# Patient Record
Sex: Female | Born: 1955 | Race: White | Hispanic: No | Marital: Single | Smoking: Never smoker
Health system: Southern US, Community
[De-identification: ages and names within clinical notes are randomized; demographics above are authoritative.]

## PROBLEM LIST (undated history)

## (undated) DIAGNOSIS — M6282 Rhabdomyolysis: Secondary | ICD-10-CM

## (undated) DIAGNOSIS — G210 Malignant neuroleptic syndrome: Secondary | ICD-10-CM

## (undated) DIAGNOSIS — R0902 Hypoxemia: Secondary | ICD-10-CM

## (undated) DIAGNOSIS — K219 Gastro-esophageal reflux disease without esophagitis: Secondary | ICD-10-CM

## (undated) DIAGNOSIS — M839 Adult osteomalacia, unspecified: Secondary | ICD-10-CM

## (undated) DIAGNOSIS — F79 Unspecified intellectual disabilities: Secondary | ICD-10-CM

## (undated) DIAGNOSIS — E079 Disorder of thyroid, unspecified: Secondary | ICD-10-CM

## (undated) DIAGNOSIS — I10 Essential (primary) hypertension: Secondary | ICD-10-CM

## (undated) DIAGNOSIS — F329 Major depressive disorder, single episode, unspecified: Secondary | ICD-10-CM

## (undated) DIAGNOSIS — F32A Depression, unspecified: Secondary | ICD-10-CM

## (undated) DIAGNOSIS — F209 Schizophrenia, unspecified: Secondary | ICD-10-CM

## (undated) HISTORY — DX: Malignant neuroleptic syndrome: G21.0

## (undated) HISTORY — DX: Rhabdomyolysis: M62.82

## (undated) HISTORY — DX: Gastro-esophageal reflux disease without esophagitis: K21.9

## (undated) HISTORY — DX: Adult osteomalacia, unspecified: M83.9

## (undated) HISTORY — DX: Schizophrenia, unspecified: F20.9

## (undated) HISTORY — DX: Essential (primary) hypertension: I10

## (undated) HISTORY — DX: Depression, unspecified: F32.A

## (undated) HISTORY — DX: Disorder of thyroid, unspecified: E07.9

## (undated) HISTORY — DX: Major depressive disorder, single episode, unspecified: F32.9

## (undated) HISTORY — DX: Unspecified intellectual disabilities: F79

## (undated) HISTORY — DX: Hypoxemia: R09.02

---

## 1999-08-27 ENCOUNTER — Other Ambulatory Visit: Admission: RE | Admit: 1999-08-27 | Discharge: 1999-08-27 | Payer: Self-pay | Admitting: *Deleted

## 2002-04-19 ENCOUNTER — Emergency Department (HOSPITAL_COMMUNITY): Admission: EM | Admit: 2002-04-19 | Discharge: 2002-04-19 | Payer: Self-pay

## 2002-05-23 ENCOUNTER — Other Ambulatory Visit: Admission: RE | Admit: 2002-05-23 | Discharge: 2002-05-23 | Payer: Self-pay | Admitting: Obstetrics and Gynecology

## 2002-06-28 ENCOUNTER — Encounter: Admission: RE | Admit: 2002-06-28 | Discharge: 2002-06-28 | Payer: Self-pay | Admitting: Obstetrics and Gynecology

## 2002-06-28 ENCOUNTER — Encounter: Payer: Self-pay | Admitting: Obstetrics and Gynecology

## 2003-08-21 ENCOUNTER — Encounter: Admission: RE | Admit: 2003-08-21 | Discharge: 2003-08-21 | Payer: Self-pay | Admitting: Obstetrics and Gynecology

## 2004-10-07 ENCOUNTER — Encounter: Admission: RE | Admit: 2004-10-07 | Discharge: 2004-10-07 | Payer: Self-pay | Admitting: Obstetrics and Gynecology

## 2005-02-25 ENCOUNTER — Encounter: Admission: RE | Admit: 2005-02-25 | Discharge: 2005-05-17 | Payer: Self-pay | Admitting: Internal Medicine

## 2005-09-10 ENCOUNTER — Other Ambulatory Visit: Admission: RE | Admit: 2005-09-10 | Discharge: 2005-09-10 | Payer: Self-pay | Admitting: Family Medicine

## 2005-10-27 ENCOUNTER — Encounter: Admission: RE | Admit: 2005-10-27 | Discharge: 2005-10-27 | Payer: Self-pay | Admitting: Family Medicine

## 2005-11-25 ENCOUNTER — Ambulatory Visit: Payer: Self-pay | Admitting: Family Medicine

## 2005-12-10 ENCOUNTER — Ambulatory Visit: Payer: Self-pay | Admitting: Family Medicine

## 2006-02-10 ENCOUNTER — Ambulatory Visit: Payer: Self-pay | Admitting: Family Medicine

## 2006-05-27 ENCOUNTER — Ambulatory Visit: Payer: Self-pay | Admitting: Family Medicine

## 2006-11-04 ENCOUNTER — Ambulatory Visit: Payer: Self-pay | Admitting: Family Medicine

## 2006-11-04 ENCOUNTER — Encounter: Admission: RE | Admit: 2006-11-04 | Discharge: 2006-11-04 | Payer: Self-pay | Admitting: Family Medicine

## 2006-12-01 ENCOUNTER — Encounter: Admission: RE | Admit: 2006-12-01 | Discharge: 2006-12-01 | Payer: Self-pay | Admitting: Family Medicine

## 2007-01-12 ENCOUNTER — Ambulatory Visit: Payer: Self-pay | Admitting: Family Medicine

## 2007-06-14 ENCOUNTER — Ambulatory Visit: Payer: Self-pay | Admitting: Family Medicine

## 2007-11-01 ENCOUNTER — Ambulatory Visit: Payer: Self-pay | Admitting: Family Medicine

## 2007-11-06 ENCOUNTER — Emergency Department (HOSPITAL_COMMUNITY): Admission: EM | Admit: 2007-11-06 | Discharge: 2007-11-06 | Payer: Self-pay | Admitting: Emergency Medicine

## 2007-11-23 ENCOUNTER — Ambulatory Visit: Payer: Self-pay | Admitting: Family Medicine

## 2008-01-11 ENCOUNTER — Ambulatory Visit: Payer: Self-pay | Admitting: Family Medicine

## 2008-02-21 ENCOUNTER — Ambulatory Visit: Payer: Self-pay | Admitting: Family Medicine

## 2008-04-25 ENCOUNTER — Ambulatory Visit: Payer: Self-pay | Admitting: Family Medicine

## 2008-05-09 ENCOUNTER — Ambulatory Visit: Payer: Self-pay | Admitting: Family Medicine

## 2008-05-17 ENCOUNTER — Ambulatory Visit: Payer: Self-pay | Admitting: Family Medicine

## 2008-05-18 HISTORY — PX: COLONOSCOPY: SHX174

## 2008-07-10 ENCOUNTER — Ambulatory Visit: Payer: Self-pay | Admitting: Family Medicine

## 2008-07-10 ENCOUNTER — Other Ambulatory Visit: Admission: RE | Admit: 2008-07-10 | Discharge: 2008-07-10 | Payer: Self-pay | Admitting: Family Medicine

## 2008-07-10 LAB — HM PAP SMEAR: HM Pap smear: NORMAL

## 2008-07-19 ENCOUNTER — Encounter: Admission: RE | Admit: 2008-07-19 | Discharge: 2008-07-19 | Payer: Self-pay | Admitting: Family Medicine

## 2008-07-25 ENCOUNTER — Encounter: Admission: RE | Admit: 2008-07-25 | Discharge: 2008-07-25 | Payer: Self-pay | Admitting: Family Medicine

## 2008-09-11 ENCOUNTER — Ambulatory Visit: Payer: Self-pay | Admitting: Family Medicine

## 2009-03-12 ENCOUNTER — Ambulatory Visit: Payer: Self-pay | Admitting: Family Medicine

## 2009-06-29 ENCOUNTER — Emergency Department (HOSPITAL_BASED_OUTPATIENT_CLINIC_OR_DEPARTMENT_OTHER): Admission: EM | Admit: 2009-06-29 | Discharge: 2009-06-29 | Payer: Self-pay | Admitting: Emergency Medicine

## 2009-10-09 ENCOUNTER — Encounter: Admission: RE | Admit: 2009-10-09 | Discharge: 2009-10-09 | Payer: Self-pay | Admitting: Family Medicine

## 2009-10-09 LAB — HM MAMMOGRAPHY: HM Mammogram: NEGATIVE

## 2009-11-15 ENCOUNTER — Ambulatory Visit: Payer: Self-pay | Admitting: Family Medicine

## 2009-11-26 ENCOUNTER — Ambulatory Visit: Payer: Self-pay | Admitting: Physician Assistant

## 2010-01-29 ENCOUNTER — Ambulatory Visit: Payer: Self-pay | Admitting: Family Medicine

## 2010-02-04 ENCOUNTER — Ambulatory Visit: Payer: Self-pay | Admitting: Physician Assistant

## 2010-02-14 ENCOUNTER — Ambulatory Visit: Payer: Self-pay | Admitting: Family Medicine

## 2010-05-21 ENCOUNTER — Ambulatory Visit
Admission: RE | Admit: 2010-05-21 | Discharge: 2010-05-21 | Payer: Self-pay | Source: Home / Self Care | Attending: Family Medicine | Admitting: Family Medicine

## 2010-06-04 ENCOUNTER — Emergency Department (HOSPITAL_COMMUNITY)
Admission: EM | Admit: 2010-06-04 | Discharge: 2010-06-04 | Payer: Self-pay | Source: Home / Self Care | Admitting: Emergency Medicine

## 2010-06-07 ENCOUNTER — Encounter: Payer: Self-pay | Admitting: Neurology

## 2010-06-11 ENCOUNTER — Ambulatory Visit
Admission: RE | Admit: 2010-06-11 | Discharge: 2010-06-11 | Payer: Self-pay | Source: Home / Self Care | Attending: Family Medicine | Admitting: Family Medicine

## 2010-08-07 LAB — CBC
MCV: 92.7 fL (ref 78.0–100.0)
Platelets: 208 10*3/uL (ref 150–400)
RBC: 4.24 MIL/uL (ref 3.87–5.11)
WBC: 5.4 10*3/uL (ref 4.0–10.5)

## 2010-08-07 LAB — URINALYSIS, ROUTINE W REFLEX MICROSCOPIC
Glucose, UA: NEGATIVE mg/dL
Protein, ur: NEGATIVE mg/dL
Specific Gravity, Urine: 1.026 (ref 1.005–1.030)
Urobilinogen, UA: 1 mg/dL (ref 0.0–1.0)

## 2010-08-07 LAB — DIFFERENTIAL
Basophils Relative: 0 % (ref 0–1)
Eosinophils Absolute: 0.1 10*3/uL (ref 0.0–0.7)
Lymphs Abs: 2.1 10*3/uL (ref 0.7–4.0)
Monocytes Relative: 7 % (ref 3–12)
Neutro Abs: 2.8 10*3/uL (ref 1.7–7.7)
Neutrophils Relative %: 52 % (ref 43–77)

## 2010-08-07 LAB — URINE CULTURE

## 2010-08-07 LAB — BASIC METABOLIC PANEL
BUN: 19 mg/dL (ref 6–23)
Calcium: 8.7 mg/dL (ref 8.4–10.5)
Chloride: 103 mEq/L (ref 96–112)
Creatinine, Ser: 0.7 mg/dL (ref 0.4–1.2)
GFR calc Af Amer: >60 mL/min (ref >60)

## 2010-08-07 LAB — URINE MICROSCOPIC-ADD ON

## 2010-08-07 LAB — ETHANOL: Alcohol, Ethyl (B): <10 mg/dL (ref 0–10)

## 2010-08-07 LAB — POCT TOXICOLOGY PANEL

## 2010-09-29 ENCOUNTER — Emergency Department (HOSPITAL_BASED_OUTPATIENT_CLINIC_OR_DEPARTMENT_OTHER)
Admission: EM | Admit: 2010-09-29 | Discharge: 2010-09-29 | Disposition: A | Discharge status: Home / Self Care | Payer: Medicare PPO | Attending: Emergency Medicine | Admitting: Emergency Medicine

## 2010-09-29 DIAGNOSIS — E119 Type 2 diabetes mellitus without complications: Secondary | ICD-10-CM | POA: Insufficient documentation

## 2010-09-29 DIAGNOSIS — K219 Gastro-esophageal reflux disease without esophagitis: Secondary | ICD-10-CM | POA: Insufficient documentation

## 2010-09-29 DIAGNOSIS — R040 Epistaxis: Secondary | ICD-10-CM | POA: Insufficient documentation

## 2010-09-29 DIAGNOSIS — Z79899 Other long term (current) drug therapy: Secondary | ICD-10-CM | POA: Insufficient documentation

## 2010-09-29 DIAGNOSIS — I10 Essential (primary) hypertension: Secondary | ICD-10-CM | POA: Insufficient documentation

## 2010-09-29 DIAGNOSIS — E785 Hyperlipidemia, unspecified: Secondary | ICD-10-CM | POA: Insufficient documentation

## 2010-10-14 ENCOUNTER — Telehealth: Payer: Self-pay | Admitting: Family Medicine

## 2010-10-14 NOTE — Telephone Encounter (Signed)
It's time for her to come in for a diabetes followup and appropriate blood work anyway.

## 2010-10-15 ENCOUNTER — Telehealth: Payer: Self-pay

## 2010-10-15 NOTE — Telephone Encounter (Signed)
Talked with brother he will make apt for diabetes check so they can get blood work

## 2010-10-29 ENCOUNTER — Encounter: Payer: Self-pay | Admitting: Family Medicine

## 2010-10-30 ENCOUNTER — Encounter: Payer: Medicare PPO | Admitting: Family Medicine

## 2010-10-31 ENCOUNTER — Other Ambulatory Visit: Payer: Self-pay

## 2010-10-31 MED ORDER — ATORVASTATIN CALCIUM 20 MG PO TABS: 20.0000 mg | ORAL_TABLET | Freq: Every day | ORAL | Status: DC

## 2010-11-03 ENCOUNTER — Other Ambulatory Visit: Payer: Self-pay | Admitting: Family Medicine

## 2010-11-03 ENCOUNTER — Other Ambulatory Visit: Payer: Medicare PPO

## 2010-11-03 DIAGNOSIS — Z1231 Encounter for screening mammogram for malignant neoplasm of breast: Secondary | ICD-10-CM

## 2010-11-03 LAB — LIPID PANEL
Cholesterol: 211 mg/dL — ABNORMAL HIGH (ref 0–200)
HDL: 47 mg/dL (ref >39)
Total CHOL/HDL Ratio: 4.5 Ratio
Triglycerides: 115 mg/dL (ref <150)
VLDL: 23 mg/dL (ref 0–40)

## 2010-11-04 NOTE — Progress Notes (Signed)
She is going to see Vincenza Hews 6/25 for med check

## 2010-11-10 ENCOUNTER — Encounter: Payer: Self-pay | Admitting: Medical

## 2010-11-10 ENCOUNTER — Encounter: Payer: Medicare PPO | Admitting: Medical

## 2010-11-10 ENCOUNTER — Ambulatory Visit (INDEPENDENT_AMBULATORY_CARE_PROVIDER_SITE_OTHER): Payer: Medicare PPO | Admitting: Medical

## 2010-11-10 VITALS — BP 132/88 | HR 78 | Temp 98.4°F | Ht 63.0 in | Wt 172.0 lb

## 2010-11-10 DIAGNOSIS — E039 Hypothyroidism, unspecified: Secondary | ICD-10-CM

## 2010-11-10 DIAGNOSIS — K219 Gastro-esophageal reflux disease without esophagitis: Secondary | ICD-10-CM

## 2010-11-10 DIAGNOSIS — E785 Hyperlipidemia, unspecified: Secondary | ICD-10-CM

## 2010-11-10 MED ORDER — ATORVASTATIN CALCIUM 40 MG PO TABS: 40.0000 mg | ORAL_TABLET | Freq: Every day | ORAL | Status: DC

## 2010-11-10 NOTE — Progress Notes (Signed)
Addended by: Jac Canavan on: 11/10/2010 03:12 PM   Modules accepted: Orders

## 2010-11-10 NOTE — Progress Notes (Signed)
Subjective:     Annette Martin is an 55 y.o. female who presents for follow up of diabetes and chronic issues.  She is here with her guardian/brother. Current symptoms include: none.  She has been doing well.  Last visit her Metformin was stopped since her numbers had improved.  Last dilated eye exam 1/12.  She goes to the [facility], and goes to an adult program 2 days /wk.  She is exercising some.  She sees [facility], Dr. Areatha Keas for mental health treatment.  She came in last week for fasting labs - lipid and HgbA1C.   The LDL was over 140 and the HgbA1C was 5.3%.  She is taking her cholesterol medication and other meds regularly, but brother notes that her diet hasn't been as good lately, more sweets than usual.  No other new c/o.    The following portions of the patient's history were reviewed and updated as appropriate: allergies, current medications, past family history, past medical history, past social history, past surgical history and problem list.  Past Medical History  Diagnosis Date  . Depression   . Thyroid disease   . GERD (gastroesophageal reflux disease)   . Diabetes mellitus   . Vitamin D deficient osteomalacia   . Mental retardation     Review of Systems Constitutional: denies recent fevers, chills, weight changes, anorexia Skin: denies foot lesions, rash, or other worrisome lesion ENT: denies cough, runny nose, sore throat, hoarseness Cardiology: denies chest pain, palpitations, edema, orthopnea, paroxysmal nocturnal dyspnea Respiratory: denies shortness of breath, dyspnea on exertion, wheezing Gastroenterology: no abdominal pain, nausea, vomiting, diarrhea, constipation, bowel change, blood in stool Musculoskeletal: denies arthralgias, myalgias, cramps Opthalmology: denies vision change, blurred vision, allergy symptoms Urology: denies dysuria, frequency, urgency Neurology: denies numbness, tingling, weakness, gait changes     Objective:    Filed Vitals:   11/10/10 1413  BP: 132/88  Pulse: 78  Temp: 98.4 F (36.9 C)    General Appearance:    Alert, no distress, WD, WN white female  Oral cavity:   Lips, mucosa, and tongue normal; teeth and gums normal  Neck:   Supple, no lymphadenopathy;  thyroid:  no    enlargement/tenderness/nodules; no carotid   bruit or JVD  Lungs:     Clear to auscultation bilaterally without wheezes, rales or       rhonchi; respirations unlabored   Heart:    Regular rate and rhythm, S1 and S2 normal, no murmur  Abdomen:     Soft, non-tender, non distended, normoactive bowel sounds,    no masses, no hepatosplenomegaly  Extremities:   No clubbing, cyanosis or edema  Pulses:   2+ and symmetric all extremities  Skin:   Warm, dry, normal turgor, specifically - no foot lesions  Neurologic:   monofilament exam of feet seems decreased in general          Psych:   Normal mood, affect, hygiene and grooming.       Assessment:   Encounter Diagnoses  Name Primary?  . Hyperlipidemia Yes  . Hypothyroid   . Type II or unspecified type diabetes mellitus without mention of complication, uncontrolled   . GERD (gastroesophageal reflux disease)      Plan:   LDL and lipids had increased significantly from last labs in September.  I increased her Lipitor today, advised she be careful with diet, discussed diet and exercise.  Otherwise, diabetes is diet controlled, c/t other meds as usual.  Given hx/o hypothyroidism, labs today.  She apparently has not been on thyroid medication for a while now.  She and her brother/guardian seemed unaware of the diagnosis.  Otherwise c/t daily foot checks, f/u with mammogram as planned soon, and recheck in 94mo.

## 2010-11-10 NOTE — Patient Instructions (Signed)
We increased your cholesterol medication, Lipitor, today to 40 mg.  Otherwise, continue your medications as usual.  We will call with additional lab results.

## 2010-11-12 ENCOUNTER — Telehealth: Payer: Self-pay | Admitting: *Deleted

## 2010-11-12 NOTE — Telephone Encounter (Addendum)
Message copied by Dorthula Perfect on Wed Nov 12, 2010 11:01 AM ------      Message from: Aleen Campi, DAVID S      Created: Wed Nov 12, 2010 10:58 AM       Thyroid and liver tests looks fine.  We increased her cholesterol medication dose this past visit.  Eat healthy, get some exercise regularly, and recheck in 91mo.    Pt's legal guardian was notified of lab results.  CM, LPN

## 2010-11-13 ENCOUNTER — Ambulatory Visit
Admission: RE | Admit: 2010-11-13 | Discharge: 2010-11-13 | Disposition: A | Discharge status: Home / Self Care | Payer: Medicare PPO | Source: Ambulatory Visit | Attending: Family Medicine | Admitting: Family Medicine

## 2010-11-13 DIAGNOSIS — Z1231 Encounter for screening mammogram for malignant neoplasm of breast: Secondary | ICD-10-CM

## 2011-02-12 LAB — URINE MICROSCOPIC-ADD ON

## 2011-02-12 LAB — COMPREHENSIVE METABOLIC PANEL
BUN: 8
CO2: 29
Calcium: 9
Creatinine, Ser: 0.62
GFR calc Af Amer: >60
GFR calc non Af Amer: >60
Glucose, Bld: 115 — ABNORMAL HIGH
Total Bilirubin: 1

## 2011-02-12 LAB — CBC
HCT: 39.7
Hemoglobin: 13.6
MCHC: 34.3
MCV: 88.2
RBC: 4.49

## 2011-02-12 LAB — URINALYSIS, ROUTINE W REFLEX MICROSCOPIC
Nitrite: NEGATIVE
Specific Gravity, Urine: 1.02
Urobilinogen, UA: 0.2
pH: 6

## 2011-02-12 LAB — DIFFERENTIAL
Basophils Absolute: 0
Lymphocytes Relative: 32
Lymphs Abs: 1.9
Neutro Abs: 3.5
Neutrophils Relative %: 58

## 2011-02-12 LAB — RAPID URINE DRUG SCREEN, HOSP PERFORMED
Amphetamines: NOT DETECTED
Cocaine: NOT DETECTED
Opiates: NOT DETECTED
Tetrahydrocannabinol: NOT DETECTED

## 2011-02-12 LAB — CARBAMAZEPINE LEVEL, TOTAL: Carbamazepine Lvl: 2.9 — ABNORMAL LOW

## 2011-02-12 LAB — PREGNANCY, URINE: Preg Test, Ur: NEGATIVE

## 2011-02-12 LAB — ETHANOL: Alcohol, Ethyl (B): <5

## 2011-05-05 ENCOUNTER — Emergency Department (HOSPITAL_COMMUNITY): Payer: Medicare PPO

## 2011-05-05 ENCOUNTER — Emergency Department (HOSPITAL_COMMUNITY)
Admission: EM | Admit: 2011-05-05 | Discharge: 2011-05-05 | Disposition: A | Discharge status: Home / Self Care | Payer: Medicare PPO | Attending: Emergency Medicine | Admitting: Emergency Medicine

## 2011-05-05 DIAGNOSIS — M25529 Pain in unspecified elbow: Secondary | ICD-10-CM | POA: Insufficient documentation

## 2011-05-05 DIAGNOSIS — S0083XA Contusion of other part of head, initial encounter: Secondary | ICD-10-CM | POA: Insufficient documentation

## 2011-05-05 DIAGNOSIS — S335XXA Sprain of ligaments of lumbar spine, initial encounter: Secondary | ICD-10-CM | POA: Insufficient documentation

## 2011-05-05 DIAGNOSIS — S5002XA Contusion of left elbow, initial encounter: Secondary | ICD-10-CM

## 2011-05-05 DIAGNOSIS — S5000XA Contusion of unspecified elbow, initial encounter: Secondary | ICD-10-CM | POA: Insufficient documentation

## 2011-05-05 DIAGNOSIS — S0003XA Contusion of scalp, initial encounter: Secondary | ICD-10-CM | POA: Insufficient documentation

## 2011-05-05 DIAGNOSIS — S46911A Strain of unspecified muscle, fascia and tendon at shoulder and upper arm level, right arm, initial encounter: Secondary | ICD-10-CM

## 2011-05-05 DIAGNOSIS — E119 Type 2 diabetes mellitus without complications: Secondary | ICD-10-CM | POA: Insufficient documentation

## 2011-05-05 DIAGNOSIS — IMO0002 Reserved for concepts with insufficient information to code with codable children: Secondary | ICD-10-CM | POA: Insufficient documentation

## 2011-05-05 DIAGNOSIS — M542 Cervicalgia: Secondary | ICD-10-CM | POA: Insufficient documentation

## 2011-05-05 DIAGNOSIS — S39012A Strain of muscle, fascia and tendon of lower back, initial encounter: Secondary | ICD-10-CM

## 2011-05-05 DIAGNOSIS — M25519 Pain in unspecified shoulder: Secondary | ICD-10-CM | POA: Insufficient documentation

## 2011-05-05 DIAGNOSIS — M545 Low back pain, unspecified: Secondary | ICD-10-CM | POA: Insufficient documentation

## 2011-05-05 DIAGNOSIS — W108XXA Fall (on) (from) other stairs and steps, initial encounter: Secondary | ICD-10-CM | POA: Insufficient documentation

## 2011-05-05 DIAGNOSIS — F79 Unspecified intellectual disabilities: Secondary | ICD-10-CM | POA: Insufficient documentation

## 2011-05-05 DIAGNOSIS — R51 Headache: Secondary | ICD-10-CM | POA: Insufficient documentation

## 2011-05-05 MED ORDER — IBUPROFEN 400 MG PO TABS: 400.0000 mg | ORAL_TABLET | Freq: Three times a day (TID) | ORAL | Status: AC | PRN

## 2011-05-05 MED ORDER — HYDROMORPHONE HCL PF 1 MG/ML IJ SOLN
1.0000 mg | Freq: Once | INTRAMUSCULAR | Status: AC
  Administered 2011-05-05: 1 mg via INTRAMUSCULAR
  Filled 2011-05-05: qty 1

## 2011-05-05 MED ORDER — HYDROCODONE-ACETAMINOPHEN 5-325 MG PO TABS: 2.0000 | ORAL_TABLET | ORAL | Status: AC | PRN

## 2011-05-05 NOTE — ED Notes (Signed)
Back from radiology.
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
## 2011-05-05 NOTE — ED Notes (Signed)
Pt arrived in c-collar and LSB.
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
## 2011-05-05 NOTE — ED Provider Notes (Signed)
History     CSN: [id number] Arrival date & time: 05/05/2011  9:41 AM   First MD Initiated Contact with Patient 05/05/11 1003      Chief Complaint  Patient presents with  . Fall    (Consider location/radiation/quality/duration/timing/severity/associated sxs/prior treatment) Patient is a 55 y.o. female presenting with fall. The history is provided by the patient and a caregiver.  Fall The accident occurred 1 to 2 hours ago. The fall occurred while walking. She fell from a height of 6 to 10 ft (The patient fell down 10 stairs backwards hitting the back of her head with brief loss of consciousness). She landed on a hard floor. There was no blood loss. The point of impact was the head, right shoulder and neck (Lumbar spine). The pain is present in the head, left elbow, right shoulder and neck (Lumbar spine). The pain is moderate. She was not ambulatory at the scene. There was no entrapment after the fall. There was no drug use involved in the accident. There was no alcohol use involved in the accident. Associated symptoms include headaches and loss of consciousness. Pertinent negatives include no visual change, no numbness, no abdominal pain, no bowel incontinence, no nausea, no vomiting, no hematuria, no hearing loss and no tingling. Exacerbated by: Movement and palpation of the areas indicated to be painful. Treatment on scene includes a c-collar and a backboard. She has tried nothing for the symptoms.    Past Medical History  Diagnosis Date  . Depression   . Thyroid disease   . GERD (gastroesophageal reflux disease)   . Diabetes mellitus   . Vitamin D deficient osteomalacia   . Mental retardation     Past Surgical History  Procedure Date  . Colonoscopy 2010    MANN    No family history on file.  History  Substance Use Topics  . Smoking status: Never Smoker   . Smokeless tobacco: Never Used  . Alcohol Use: No    OB History    Grav Para Term Preterm Abortions TAB SAB Ect  Mult Living                  Review of Systems  Constitutional: Negative for diaphoresis.  HENT: Positive for neck pain. Negative for hearing loss, ear pain, nosebleeds, congestion, facial swelling, rhinorrhea, dental problem, voice change, postnasal drip, tinnitus and ear discharge.   Eyes: Negative for photophobia, pain, redness and visual disturbance.  Respiratory: Negative for cough, choking, shortness of breath, wheezing and stridor.   Cardiovascular: Negative for chest pain.  Gastrointestinal: Negative for nausea, vomiting, abdominal pain, abdominal distention and bowel incontinence.  Genitourinary: Negative for hematuria, flank pain, decreased urine volume and difficulty urinating.  Musculoskeletal: Positive for back pain and arthralgias. Negative for myalgias and joint swelling.  Skin: Negative for color change, pallor and wound.  Neurological: Positive for loss of consciousness and headaches. Negative for dizziness, tingling, tremors, weakness, light-headedness and numbness.  Hematological: Does not bruise/bleed easily.  Psychiatric/Behavioral: Negative for confusion and agitation.    Allergies  Review of patient's allergies indicates no known allergies.  Home Medications   Current Outpatient Rx  Name Route Sig Dispense Refill  . BUPROPION HCL ER (XL) 300 MG PO TB24 Oral Take 300 mg by mouth daily.      Marland Kitchen CARBAMAZEPINE ER (ANTIPSYCH) 300 MG PO CP12 Oral Take 300 mg by mouth daily.      Marland Kitchen METFORMIN HCL 500 MG PO TABS Oral Take 500 mg by mouth 2 (  two) times daily with a meal.      . THERA M PLUS PO TABS Oral Take 1 tablet by mouth daily.      Marland Kitchen PANTOPRAZOLE SODIUM 40 MG PO TBEC Oral Take 40 mg by mouth daily.      Marland Kitchen ALPRAZOLAM 1 MG PO TABS Oral Take 1 mg by mouth at bedtime as needed.      Marland Kitchen LAMOTRIGINE 150 MG PO TABS Oral Take 150 mg by mouth daily.      Marland Kitchen OMEPRAZOLE 20 MG PO CPDR Oral Take 20 mg by mouth 2 (two) times daily.      . QUETIAPINE FUMARATE 400 MG PO TB24 Oral  Take 400 mg by mouth at bedtime.        BP 113/73  Pulse 75  Temp(Src) 99.5 F (37.5 C) (Oral)  Resp 20  SpO2 96%  Physical Exam  Nursing note and vitals reviewed. Constitutional: She is oriented to person, place, and time. She appears well-developed and well-nourished. No distress.       The patient appears in no significant discomfort  HENT:  Head: Normocephalic and atraumatic. Head is without raccoon's eyes, without Battle's sign, without contusion and without laceration. No trismus in the jaw.  Right Ear: Hearing, tympanic membrane, external ear and ear canal normal. No drainage or tenderness. No mastoid tenderness. Tympanic membrane is not perforated. No hemotympanum.  Left Ear: Hearing, tympanic membrane, external ear and ear canal normal. No drainage or tenderness. No mastoid tenderness. Tympanic membrane is not perforated. No hemotympanum.  Nose: Nose normal. No mucosal edema, rhinorrhea, nose lacerations, sinus tenderness, nasal deformity, septal deviation or nasal septal hematoma. No epistaxis. Right sinus exhibits no maxillary sinus tenderness and no frontal sinus tenderness. Left sinus exhibits no maxillary sinus tenderness and no frontal sinus tenderness.  Mouth/Throat: Uvula is midline, oropharynx is clear and moist and mucous membranes are normal. Normal dentition. No lacerations.  Eyes: Conjunctivae, EOM and lids are normal. Pupils are equal, round, and reactive to light. Right eye exhibits no chemosis. Left eye exhibits no chemosis. Right conjunctiva is not injected. Right conjunctiva has no hemorrhage. Left conjunctiva is not injected. Left conjunctiva has no hemorrhage. Right eye exhibits normal extraocular motion. Left eye exhibits normal extraocular motion.  Fundoscopic exam:      The right eye shows no hemorrhage and no papilledema.       The left eye shows no hemorrhage and no papilledema.  Neck: Trachea normal and phonation normal. No JVD present. No tracheal  tenderness, no spinous process tenderness and no muscular tenderness present. Carotid bruit is not present. No rigidity. No tracheal deviation and normal range of motion present.       Immobilized in C collar  Cardiovascular: Normal rate, regular rhythm, S1 normal, S2 normal, normal heart sounds and intact distal pulses.  Exam reveals no gallop, no distant heart sounds and no friction rub.   No murmur heard. Pulmonary/Chest: Effort normal and breath sounds normal. No accessory muscle usage or stridor. Not tachypneic. No respiratory distress. She has no decreased breath sounds. She has no wheezes. She has no rales. She exhibits no tenderness, no bony tenderness, no crepitus, no deformity and no retraction.  Abdominal: Soft. Normal appearance and bowel sounds are normal. She exhibits no distension, no pulsatile midline mass and no mass. There is no splenomegaly or hepatomegaly. There is no tenderness. There is no rigidity, no rebound, no guarding and no CVA tenderness.  Musculoskeletal: Normal range of motion. She exhibits  tenderness. She exhibits no edema.       Right shoulder: She exhibits tenderness, bony tenderness and pain. She exhibits no swelling, no effusion, no crepitus, no deformity and normal strength.       Left elbow: She exhibits normal range of motion, no swelling, no effusion, no deformity and no laceration. tenderness found. Radial head, medial epicondyle, lateral epicondyle and olecranon process tenderness noted.       Cervical back: She exhibits tenderness, bony tenderness, pain and spasm. She exhibits no swelling and no deformity.       Thoracic back: She exhibits no tenderness, no bony tenderness, no deformity and no pain.       Lumbar back: She exhibits tenderness, bony tenderness, pain and spasm. She exhibits no deformity.  Neurological: She is alert and oriented to person, place, and time. She has normal reflexes. She is not disoriented. No cranial nerve deficit or sensory deficit.  She exhibits normal muscle tone. Coordination normal. GCS eye subscore is 4. GCS verbal subscore is 5. GCS motor subscore is 6.  Skin: Skin is warm and dry. No rash noted. She is not diaphoretic. No erythema. No pallor.  Psychiatric: She has a normal mood and affect. Her behavior is normal. Judgment and thought content normal.    ED Course  Procedures (including critical care time)  Labs Reviewed - No data to display No results found.   No diagnosis found.    MDM  Closed head injury, vertebral fracture, shoulder fracture, elbow fracture, contusions, cervical strain, lumbar strain are all considered as potential etiologies of the patient's pain and sequelae of her trauma amongst other potential etiologies. No other injuries are suspected based on history and physical examination.        Felisa Bonier, MD 05/05/11 [phone]

## 2011-05-08 ENCOUNTER — Encounter (HOSPITAL_COMMUNITY): Payer: Self-pay | Admitting: *Deleted

## 2011-05-28 ENCOUNTER — Ambulatory Visit (INDEPENDENT_AMBULATORY_CARE_PROVIDER_SITE_OTHER): Payer: Medicare PPO | Admitting: Family Medicine

## 2011-05-28 VITALS — BP 120/80 | HR 94 | Temp 99.9°F | Ht 63.5 in | Wt 155.0 lb

## 2011-05-28 DIAGNOSIS — E785 Hyperlipidemia, unspecified: Secondary | ICD-10-CM

## 2011-05-28 DIAGNOSIS — E039 Hypothyroidism, unspecified: Secondary | ICD-10-CM

## 2011-05-28 DIAGNOSIS — E119 Type 2 diabetes mellitus without complications: Secondary | ICD-10-CM | POA: Insufficient documentation

## 2011-05-28 DIAGNOSIS — Z79899 Other long term (current) drug therapy: Secondary | ICD-10-CM

## 2011-05-28 DIAGNOSIS — J209 Acute bronchitis, unspecified: Secondary | ICD-10-CM

## 2011-05-28 MED ORDER — AMOXICILLIN 875 MG PO TABS: 875.0000 mg | ORAL_TABLET | Freq: Two times a day (BID) | ORAL | Status: AC

## 2011-05-28 NOTE — Patient Instructions (Signed)
Take all of the antibiotic and if not totally back to normal, call me. 

## 2011-05-28 NOTE — Progress Notes (Signed)
  Subjective:    Patient ID: Annette Martin, female    DOB: 10/17/1955, 56 y.o.   MRN: [id number]  HPI Complains of approximately one month history of intermittent cough and congestion as well as hoarse voice. No fever, chills, sore throat. She has not been seen in approximately 6 months. We did not discuss this but I am aware of the fact that she has had a great deal of difficulty getting care mainly due to her lying and belligerent behavior. She continues on medications listed in the chart and has no other concerns or complaints. Review of Systems     Objective:   Physical Exam alert and in no distress. Tympanic membranes and canals are normal. Throat is clear. Tonsils are normal. Neck is supple without adenopathy or thyromegaly. Cardiac exam shows a regular sinus rhythm without murmurs or gallops. Lungs are clear to auscultation.        Assessment & Plan:   1. Bronchitis, acute    2. Diabetes mellitus  Hemoglobin A1c  3. Hypothyroidism  TSH  4. Hyperlipidemia LDL goal <70  Lipid panel  5. Encounter for long-term (current) use of other medications  CBC with Differential, Comprehensive metabolic panel, Lipid panel

## 2011-05-29 LAB — COMPREHENSIVE METABOLIC PANEL
ALT: 30 U/L (ref 0–35)
AST: 28 U/L (ref 0–37)
Albumin: 4.4 g/dL (ref 3.5–5.2)
Alkaline Phosphatase: 109 U/L (ref 39–117)
Potassium: 3.9 mEq/L (ref 3.5–5.3)
Sodium: 138 mEq/L (ref 135–145)
Total Bilirubin: 0.5 mg/dL (ref 0.3–1.2)
Total Protein: 7 g/dL (ref 6.0–8.3)

## 2011-05-29 LAB — CBC WITH DIFFERENTIAL/PLATELET
Basophils Absolute: 0 10*3/uL (ref 0.0–0.1)
Basophils Relative: 1 % (ref 0–1)
Eosinophils Absolute: 0.1 10*3/uL (ref 0.0–0.7)
Hemoglobin: 11.1 g/dL — ABNORMAL LOW (ref 12.0–15.0)
MCH: 28.6 pg (ref 26.0–34.0)
MCHC: 31.5 g/dL (ref 30.0–36.0)
Monocytes Relative: 12 % (ref 3–12)
Neutro Abs: 2.3 10*3/uL (ref 1.7–7.7)
Neutrophils Relative %: 57 % (ref 43–77)
Platelets: 199 10*3/uL (ref 150–400)

## 2011-05-29 LAB — LIPID PANEL
LDL Cholesterol: 51 mg/dL (ref 0–99)
Triglycerides: 77 mg/dL (ref <150)
VLDL: 15 mg/dL (ref 0–40)

## 2011-05-29 LAB — HEMOGLOBIN A1C
Hgb A1c MFr Bld: 5.6 % (ref <5.7)
Mean Plasma Glucose: 114 mg/dL (ref <117)

## 2011-05-29 LAB — TSH: TSH: 1.176 u[IU]/mL (ref 0.350–4.500)

## 2011-07-14 ENCOUNTER — Encounter: Payer: Self-pay | Admitting: Internal Medicine

## 2011-07-15 ENCOUNTER — Encounter: Payer: Self-pay | Admitting: Medical

## 2011-07-15 ENCOUNTER — Telehealth: Payer: Self-pay | Admitting: Family Medicine

## 2011-07-15 ENCOUNTER — Ambulatory Visit (INDEPENDENT_AMBULATORY_CARE_PROVIDER_SITE_OTHER): Payer: Medicare PPO | Admitting: Medical

## 2011-07-15 ENCOUNTER — Ambulatory Visit
Admission: RE | Admit: 2011-07-15 | Discharge: 2011-07-15 | Disposition: A | Discharge status: Home / Self Care | Payer: Medicare PPO | Source: Ambulatory Visit | Attending: Medical | Admitting: Medical

## 2011-07-15 VITALS — BP 120/70 | HR 80 | Temp 98.4°F | Resp 16 | Wt 162.0 lb

## 2011-07-15 DIAGNOSIS — W1800XA Striking against unspecified object with subsequent fall, initial encounter: Secondary | ICD-10-CM

## 2011-07-15 DIAGNOSIS — R079 Chest pain, unspecified: Secondary | ICD-10-CM

## 2011-07-15 DIAGNOSIS — R071 Chest pain on breathing: Secondary | ICD-10-CM

## 2011-07-15 DIAGNOSIS — M549 Dorsalgia, unspecified: Secondary | ICD-10-CM

## 2011-07-15 DIAGNOSIS — R0789 Other chest pain: Secondary | ICD-10-CM

## 2011-07-15 DIAGNOSIS — R0781 Pleurodynia: Secondary | ICD-10-CM

## 2011-07-15 DIAGNOSIS — W1809XA Striking against other object with subsequent fall, initial encounter: Secondary | ICD-10-CM

## 2011-07-15 NOTE — Progress Notes (Signed)
  Subjective:   HPI  Annette Martin is a 56 y.o. female who presents with c/o chest wall, rib and back pain.  She notes fall in bath tub about 2 months ago, slipped on soapy tub bottom, and landed with her ribs/chest against side of the tub.  The bad had been much worse, but she is here today as the pain has persisted for about 2 months she thinks.  Using OTC analgesics.  Motion, stretching her left arm, and pressure against her left side/chest/ribs hurts.  No current bruising or difficulty breathing.  No other aggravating or relieving factors.    No other c/o.  The following portions of the patient's history were reviewed and updated as appropriate: allergies, current medications, past family history, past medical history, past social history, past surgical history and problem list.  Past Medical History  Diagnosis Date  . Depression   . Thyroid disease   . GERD (gastroesophageal reflux disease)   . Diabetes mellitus   . Vitamin D deficient osteomalacia   . Mental retardation     No Known Allergies   Review of Systems Gen: no fever, chills, sweats HEENT: negative Heat: no CP, palpations, edema, DOE Lungs: no SOB, wheezing, dyspnea GI: negative GU: negative Neuro: negative numbness, tingling, weakness ROS reviewed and was negative other than noted in HPI or above.    Objective:   Physical Exam  General appearance: alert, no distress, WD/WN Neck: supple, no lymphadenopathy, no thyromegaly, no masses Chest wall: tender left chest wall laterally and somewhat anterior and posteriorly, normal I:E with respirations, no obvious bruising  Heart: RRR, normal S1, S2, no murmurs Lungs: CTA bilaterally, no wheezes, rhonchi, or rales Abdomen: +bs, soft, non tender, non distended, no masses, no hepatomegaly, no splenomegaly Back: tender left thoracic paraspinal region/chest wall Pulses: 2+ symmetric  Assessment and Plan :     Encounter Diagnoses  Name Primary?  . Chest wall pain  Yes  . Back pain   . Rib pain   . Fall against object    Etiology likely slowly healing rib contusion. Discussed the painful and slow healing nature of rib contusions.  Advised OTC Aleve, rest, and incentive spirometry.  Will send for xrays.

## 2011-07-16 ENCOUNTER — Encounter: Payer: Self-pay | Admitting: Medical

## 2011-07-17 NOTE — Telephone Encounter (Signed)
DONE

## 2011-08-07 ENCOUNTER — Telehealth: Payer: Self-pay | Admitting: Family Medicine

## 2011-08-07 MED ORDER — ATORVASTATIN CALCIUM 20 MG PO TABS: 20.0000 mg | ORAL_TABLET | Freq: Every day | ORAL | Status: DC

## 2011-08-07 NOTE — Telephone Encounter (Signed)
MED SENT IN 

## 2011-08-07 NOTE — Telephone Encounter (Signed)
Despina Hick called and states wife is out of Lipitor 20 mg qd at [facility] on [address].

## 2011-09-21 ENCOUNTER — Telehealth: Payer: Self-pay | Admitting: Internal Medicine

## 2011-09-22 ENCOUNTER — Telehealth: Payer: Self-pay | Admitting: Medical

## 2011-09-22 ENCOUNTER — Other Ambulatory Visit: Payer: Self-pay | Admitting: Medical

## 2011-09-22 MED ORDER — THERA M PLUS PO TABS: 1.0000 | ORAL_TABLET | Freq: Every day | ORAL | Status: DC

## 2011-09-22 MED ORDER — ATORVASTATIN CALCIUM 20 MG PO TABS: 20.0000 mg | ORAL_TABLET | Freq: Every day | ORAL | Status: DC

## 2011-09-22 MED ORDER — PANTOPRAZOLE SODIUM 40 MG PO TBEC: DELAYED_RELEASE_TABLET | ORAL | Status: DC

## 2011-09-22 NOTE — Telephone Encounter (Signed)
done

## 2011-09-22 NOTE — Telephone Encounter (Signed)
LM

## 2011-10-26 ENCOUNTER — Telehealth: Payer: Self-pay | Admitting: Medical

## 2011-10-26 MED ORDER — ATORVASTATIN CALCIUM 20 MG PO TABS: 20.0000 mg | ORAL_TABLET | Freq: Every day | ORAL | Status: DC

## 2011-10-26 NOTE — Telephone Encounter (Signed)
Sent in #90 of atorvastatin to [facility]

## 2011-12-28 ENCOUNTER — Other Ambulatory Visit: Payer: Self-pay | Admitting: Family Medicine

## 2011-12-28 DIAGNOSIS — Z1231 Encounter for screening mammogram for malignant neoplasm of breast: Secondary | ICD-10-CM

## 2012-01-13 ENCOUNTER — Ambulatory Visit
Admission: RE | Admit: 2012-01-13 | Discharge: 2012-01-13 | Disposition: A | Discharge status: Home / Self Care | Payer: Medicare PPO | Source: Ambulatory Visit | Attending: Family Medicine | Admitting: Family Medicine

## 2012-01-13 DIAGNOSIS — Z1231 Encounter for screening mammogram for malignant neoplasm of breast: Secondary | ICD-10-CM

## 2012-03-22 ENCOUNTER — Encounter: Payer: Self-pay | Admitting: Family Medicine

## 2012-03-22 ENCOUNTER — Ambulatory Visit (INDEPENDENT_AMBULATORY_CARE_PROVIDER_SITE_OTHER): Payer: Medicare PPO | Admitting: Family Medicine

## 2012-03-22 VITALS — BP 120/80

## 2012-03-22 DIAGNOSIS — L84 Corns and callosities: Secondary | ICD-10-CM

## 2012-03-22 DIAGNOSIS — K219 Gastro-esophageal reflux disease without esophagitis: Secondary | ICD-10-CM

## 2012-03-22 NOTE — Patient Instructions (Addendum)
Make sure you wear wider shoes and use a Dr. Margart Sickles doughnut on that Take the Protonix on a daily basis if you need for the reflux

## 2012-03-22 NOTE — Progress Notes (Signed)
  Subjective:    Patient ID: Annette Martin, female    DOB: 06/24/1955, 56 y.o.   MRN: [id number]  HPI She complains of right great toe pain the last several months. She also is having some difficulty with reflux but is not taking her Protonix regularly.  Review of Systems     Objective:   Physical Exam Exam of the medial aspect of the right great toe does show some slight erythema and cystic type feel to that area.       Assessment & Plan:   1. Corn or callus   2. GERD (gastroesophageal reflux disease)    recommend taking Protonix on a regular basis. Also wear wider shoes and use a Dr. Margart Sickles doughnut to take some pressure off of this area.

## 2012-04-25 ENCOUNTER — Telehealth: Payer: Self-pay | Admitting: Family Medicine

## 2012-04-25 ENCOUNTER — Other Ambulatory Visit: Payer: Self-pay | Admitting: Medical

## 2012-04-25 MED ORDER — ATORVASTATIN CALCIUM 20 MG PO TABS: 20.0000 mg | ORAL_TABLET | Freq: Every day | ORAL | Status: DC

## 2012-04-25 NOTE — Telephone Encounter (Signed)
Annette Martin states that he has made arrangements for Annette Martin to go to [facility] for a week. They are requiring that a copy of a written rx for atorvastatin be faxed to this facility. The fax number is [phone]. He also states that she needs a refill of this medication be sent to [facility] on [address].

## 2012-06-02 ENCOUNTER — Telehealth: Payer: Self-pay | Admitting: Family Medicine

## 2012-06-06 NOTE — Telephone Encounter (Signed)
TSD  

## 2012-08-02 ENCOUNTER — Institutional Professional Consult (permissible substitution): Payer: Self-pay | Admitting: Family Medicine

## 2012-08-03 ENCOUNTER — Ambulatory Visit (INDEPENDENT_AMBULATORY_CARE_PROVIDER_SITE_OTHER): Payer: Medicare PPO | Admitting: Family Medicine

## 2012-08-03 ENCOUNTER — Encounter: Payer: Self-pay | Admitting: Family Medicine

## 2012-08-03 VITALS — BP 118/76 | HR 78 | Wt 192.0 lb

## 2012-08-03 DIAGNOSIS — E039 Hypothyroidism, unspecified: Secondary | ICD-10-CM

## 2012-08-03 DIAGNOSIS — E785 Hyperlipidemia, unspecified: Secondary | ICD-10-CM

## 2012-08-03 DIAGNOSIS — E119 Type 2 diabetes mellitus without complications: Secondary | ICD-10-CM

## 2012-08-03 DIAGNOSIS — F79 Unspecified intellectual disabilities: Secondary | ICD-10-CM

## 2012-08-03 DIAGNOSIS — Z79899 Other long term (current) drug therapy: Secondary | ICD-10-CM

## 2012-08-03 LAB — LIPID PANEL
HDL: 48 mg/dL (ref >39)
LDL Cholesterol: 79 mg/dL (ref 0–99)
Total CHOL/HDL Ratio: 3.4 Ratio
Triglycerides: 177 mg/dL — ABNORMAL HIGH (ref <150)
VLDL: 35 mg/dL (ref 0–40)

## 2012-08-03 LAB — CBC WITH DIFFERENTIAL/PLATELET
Basophils Absolute: 0 10*3/uL (ref 0.0–0.1)
Basophils Relative: 1 % (ref 0–1)
MCHC: 33.2 g/dL (ref 30.0–36.0)
Monocytes Absolute: 0.5 10*3/uL (ref 0.1–1.0)
Neutro Abs: 3.9 10*3/uL (ref 1.7–7.7)
Neutrophils Relative %: 62 % (ref 43–77)
RDW: 14.3 % (ref 11.5–15.5)

## 2012-08-03 LAB — COMPREHENSIVE METABOLIC PANEL
AST: 26 U/L (ref 0–37)
Albumin: 4.2 g/dL (ref 3.5–5.2)
Alkaline Phosphatase: 96 U/L (ref 39–117)
BUN: 12 mg/dL (ref 6–23)
Potassium: 4.2 mEq/L (ref 3.5–5.3)
Sodium: 140 mEq/L (ref 135–145)

## 2012-08-03 NOTE — Patient Instructions (Signed)
Talk to [facility] about increasing the Prozac to 40 mg to help with her hot flashes

## 2012-08-03 NOTE — Progress Notes (Signed)
  Subjective:    Patient ID: Annette Martin, female    DOB: Apr 08, 1956, 57 y.o.   MRN: [id number]  HPI She is here for consultation. She is now in a group home in [facility]. She is here for medication check. In the past she had been on Glucophage but presently is not on it. Review of record indicates she has gained 30 pounds in the last year. She also is having difficulty with hot flashes. Presently she is taking Prozac as well as other psychotropic medications. She is taking Prilosec on an every other day basis for control of her reflux. She has a past history of being on thyroid medication but is not taking it at present time.medications were reviewed. Past medical history was also reviewed. He does have a hemoglobin A1c in June 2009 of 6.7.   Review of Systems     Objective:   Physical Exam alert and in no distress. Tympanic membranes and canals are normal. Throat is clear. Tonsils are normal. Neck is supple without adenopathy or thyromegaly. Cardiac exam shows a regular sinus rhythm without murmurs or gallops. Lungs are clear to auscultation. Hemoglobin A1c is 5.9.       Assessment & Plan:  Diabetes mellitus  Hypothyroidism - Plan: TSH  Hyperlipidemia LDL goal <70  Morbid obesity  Mental retardation  Encounter for long-term (current) use of other medications - Plan: Lipid panel, CBC with Differential, Comprehensive metabolic panel I will fill out the F. L2 form when I get the blood work back. More information is needed concerning possible thyroid disease. Also discussed using Prilosec less often however that would be difficult to do under her present living situation. Recommended increasing her Prozac to 40 mg to help with her menopausal symptoms.

## 2012-08-04 NOTE — Progress Notes (Signed)
Quick Note:  CALLED BROTHER ALLEN HE WAS INFORMED OF RESULTS AND WILL BE BY TO PICK UP FORM ______

## 2012-09-09 ENCOUNTER — Ambulatory Visit (INDEPENDENT_AMBULATORY_CARE_PROVIDER_SITE_OTHER): Payer: Medicare PPO | Admitting: Family Medicine

## 2012-09-09 ENCOUNTER — Encounter: Payer: Self-pay | Admitting: Family Medicine

## 2012-09-09 VITALS — BP 124/78 | HR 76 | Temp 98.4°F | Wt 194.0 lb

## 2012-09-09 DIAGNOSIS — R32 Unspecified urinary incontinence: Secondary | ICD-10-CM

## 2012-09-09 DIAGNOSIS — J019 Acute sinusitis, unspecified: Secondary | ICD-10-CM

## 2012-09-09 DIAGNOSIS — F980 Enuresis not due to a substance or known physiological condition: Secondary | ICD-10-CM

## 2012-09-09 MED ORDER — AMOXICILLIN 875 MG PO TABS: 875.0000 mg | ORAL_TABLET | Freq: Two times a day (BID) | ORAL | Status: DC

## 2012-09-09 MED ORDER — ALBUTEROL SULFATE HFA 108 (90 BASE) MCG/ACT IN AERS: 2.0000 | INHALATION_SPRAY | Freq: Four times a day (QID) | RESPIRATORY_TRACT | Status: DC | PRN

## 2012-09-09 NOTE — Patient Instructions (Signed)
Take all the antibiotic and use the inhaler as needed. You can also use Robitussin-DM. Let me know about the bladder and urinating and if you keep having trouble get further help

## 2012-09-09 NOTE — Progress Notes (Signed)
  Subjective:    Patient ID: Annette Martin, female    DOB: March 25, 1956, 57 y.o.   MRN: [id number]  HPI She has a five-day history that started with cough that has progressed to sinus pressure and tooth pain. The cough is now intermittently productive of yellow sputum. No sore throat, fever, chills, itchy watery eyes, earache. She does not smoke or have underlying allergies. She's had no sick contact. She also is had some enuresis but no urgency, frequency or dysuria.   Review of Systems     Objective:   Physical Exam alert and in no distress. Tympanic membranes and canals are normal. Throat is clear. Tonsils are normal. Neck is supple without adenopathy or thyromegaly. Cardiac exam shows a regular sinus rhythm without murmurs or gallops. Lungs show expiratory rhonchi. Urine microscopic was contaminated.        Assessment & Plan:  Sinusitis, acute - Plan: amoxicillin (AMOXIL) 875 MG tablet, albuterol (PROVENTIL HFA;VENTOLIN HFA) 108 (90 BASE) MCG/ACT inhaler  Enuresis she is to let me know whether her urinary symptoms and coughing go away.

## 2012-10-19 ENCOUNTER — Telehealth: Payer: Self-pay | Admitting: Family Medicine

## 2012-10-21 NOTE — Telephone Encounter (Signed)
NO MESSAGE LEFT

## 2012-11-11 ENCOUNTER — Ambulatory Visit (INDEPENDENT_AMBULATORY_CARE_PROVIDER_SITE_OTHER): Payer: Medicare PPO | Admitting: Family Medicine

## 2012-11-11 ENCOUNTER — Encounter: Payer: Self-pay | Admitting: Family Medicine

## 2012-11-11 VITALS — BP 140/70 | HR 80 | Wt 204.0 lb

## 2012-11-11 DIAGNOSIS — S6000XA Contusion of unspecified finger without damage to nail, initial encounter: Secondary | ICD-10-CM

## 2012-11-11 NOTE — Progress Notes (Signed)
  Subjective:    Patient ID: Annette Martin, female    DOB: 1955/09/16, 57 y.o.   MRN: [id number]  HPI She injured her left finger yesterday in a fall. She is not sure whether she flexed her hyperextended.   Review of Systems     Objective:   Physical Exam Left fifth finger is slightly tender over the PIP joint with pain on motion. X-ray shows no fracture.       Assessment & Plan:  Finger contusion, initial encounter  recommend buddy taping

## 2012-12-05 ENCOUNTER — Ambulatory Visit (INDEPENDENT_AMBULATORY_CARE_PROVIDER_SITE_OTHER): Payer: Medicare PPO | Admitting: Family Medicine

## 2012-12-05 ENCOUNTER — Encounter: Payer: Self-pay | Admitting: Family Medicine

## 2012-12-05 VITALS — BP 130/80 | HR 72 | Ht 63.0 in | Wt 199.0 lb

## 2012-12-05 DIAGNOSIS — R232 Flushing: Secondary | ICD-10-CM

## 2012-12-05 DIAGNOSIS — E669 Obesity, unspecified: Secondary | ICD-10-CM

## 2012-12-05 DIAGNOSIS — N951 Menopausal and female climacteric states: Secondary | ICD-10-CM

## 2012-12-05 DIAGNOSIS — E785 Hyperlipidemia, unspecified: Secondary | ICD-10-CM

## 2012-12-05 DIAGNOSIS — F79 Unspecified intellectual disabilities: Secondary | ICD-10-CM

## 2012-12-05 DIAGNOSIS — E119 Type 2 diabetes mellitus without complications: Secondary | ICD-10-CM

## 2012-12-05 NOTE — Progress Notes (Signed)
  Subjective:    Patient ID: Annette Martin, female    DOB: Aug 04, 1955, 57 y.o.   MRN: [id number]  HPI He is here for recheck. Her weight is down slightly. She continues on medications listed in the chart. She continues to have difficulty with excessive eating and behavioral issues. She recently was put in a respite situation for one month however her behavior problems continue. They're considering moving her into a different facility. She continues on multiple psychotropic medications and will followup with her provider in the near future. She does complain of hot flashes.   Review of Systems     Objective:   Physical Exam alert and in no distress. Tympanic membranes and canals are normal. Throat is clear. Tonsils are normal. Neck is supple without adenopathy or thyromegaly. Cardiac exam shows a regular sinus rhythm without murmurs or gallops. Lungs are clear to auscultation. Abdominal exam shows diffuse questionable abdominal tenderness(she did not complain of abdominal pain).        Assessment & Plan:  Obesity (BMI 30-39.9)  Diabetes mellitus  Hyperlipidemia LDL goal <70  Mental retardation  I will refer her to GYN for further evaluation. I do not feel comfortable placing her on HRT. I explained to her that I think her main issue is her behavioral problem. Discussed the fact that her mother would not allow her to do anything she is presently doing and she should eat more responsibilities

## 2012-12-06 NOTE — Addendum Note (Signed)
Addended by: Ronnald Nian on: 12/06/2012 01:26 PM   Modules accepted: Level of Service

## 2012-12-28 ENCOUNTER — Telehealth: Payer: Self-pay | Admitting: Family Medicine

## 2012-12-29 ENCOUNTER — Other Ambulatory Visit: Payer: Self-pay

## 2012-12-29 DIAGNOSIS — Z139 Encounter for screening, unspecified: Secondary | ICD-10-CM

## 2012-12-29 NOTE — Telephone Encounter (Signed)
CALLED AND TALKED TO [place] TO LET HER KNOW WE NEED A COPY OF PT MEDICAID CARD BEFORE I CAN GET APPOINTMENT IT HAS TO BE BLANK OR THE GYN OFFICE NEEDS TO SEE IT AND OTHER INS. CARD TO DETERMINE IF THEY WILL SEE HER

## 2013-02-08 ENCOUNTER — Other Ambulatory Visit (INDEPENDENT_AMBULATORY_CARE_PROVIDER_SITE_OTHER): Payer: Medicare PPO

## 2013-02-08 ENCOUNTER — Telehealth: Payer: Self-pay | Admitting: Family Medicine

## 2013-02-08 DIAGNOSIS — Z111 Encounter for screening for respiratory tuberculosis: Secondary | ICD-10-CM

## 2013-02-08 NOTE — Telephone Encounter (Signed)
Pt's brother dropped by forms for a assisted living facility. He states that she has been asked to leave her current situation and must be out by end of month. He has found a place for her at a new facility. These forms need to be filled out as soon as possible. Please call Hessie Diener at [phone] when complete. I am sending forms back on her chart.

## 2013-03-23 ENCOUNTER — Other Ambulatory Visit: Payer: Self-pay

## 2013-07-18 IMAGING — CR DG ELBOW COMPLETE 3+V*L*
4 series · 4 of 4 positions shown · non-contrast
Comparison: None.

CLINICAL DATA: Fall, elbow pain.

LEFT ELBOW - COMPLETE 3+ VIEW

[t elbow (1 of 3)]
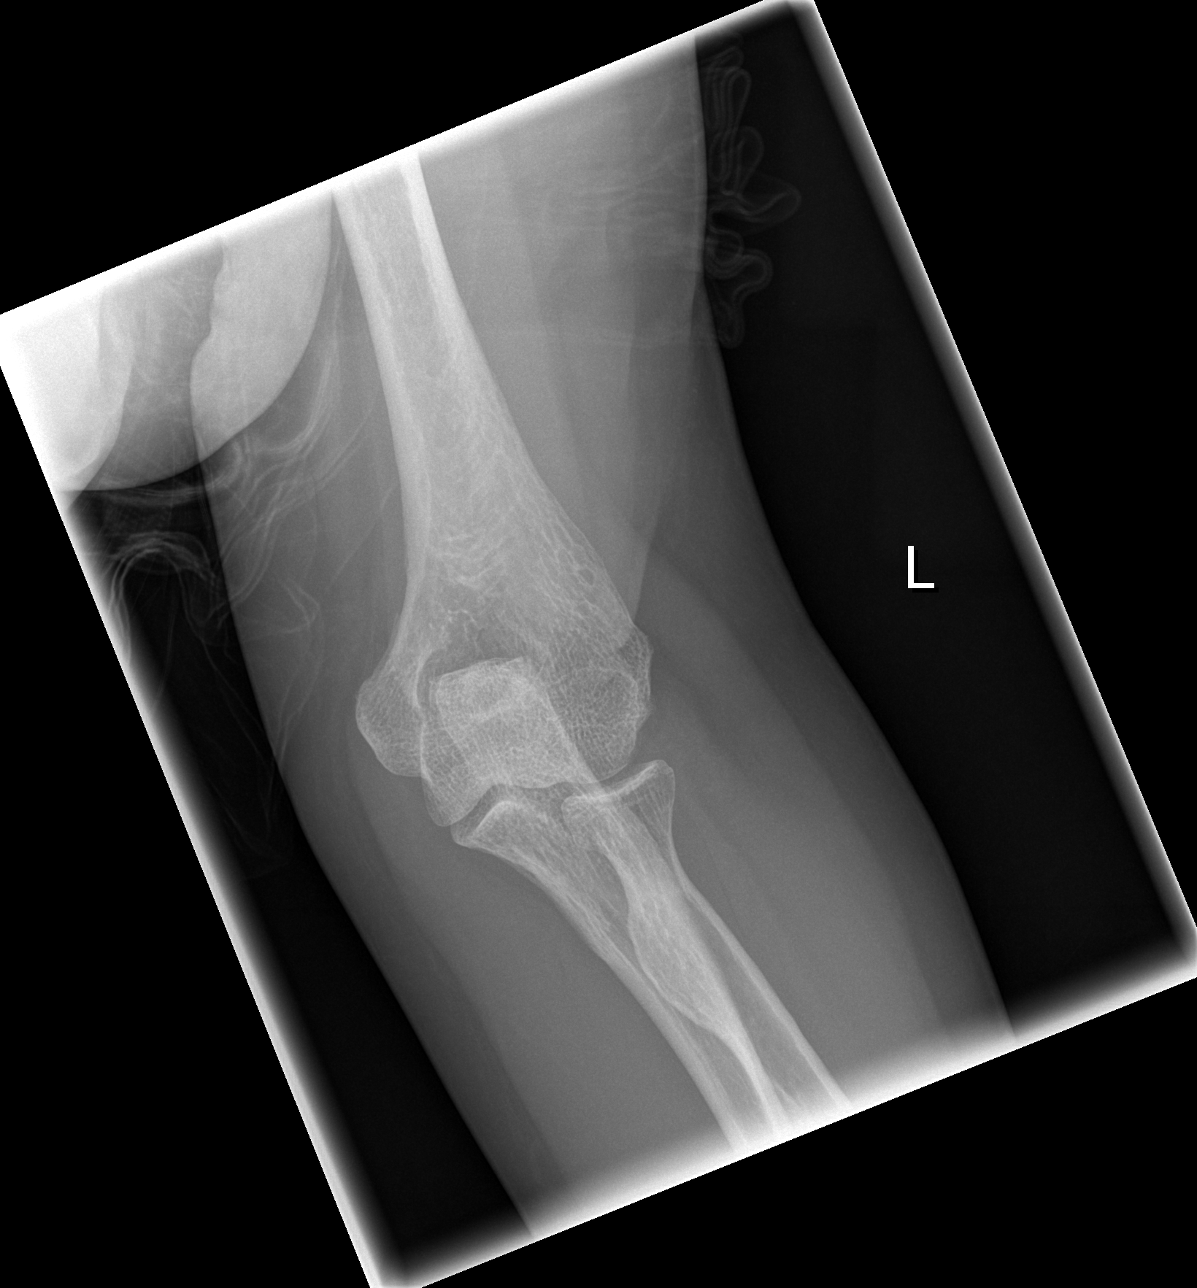

[t elbow (2 of 3)]
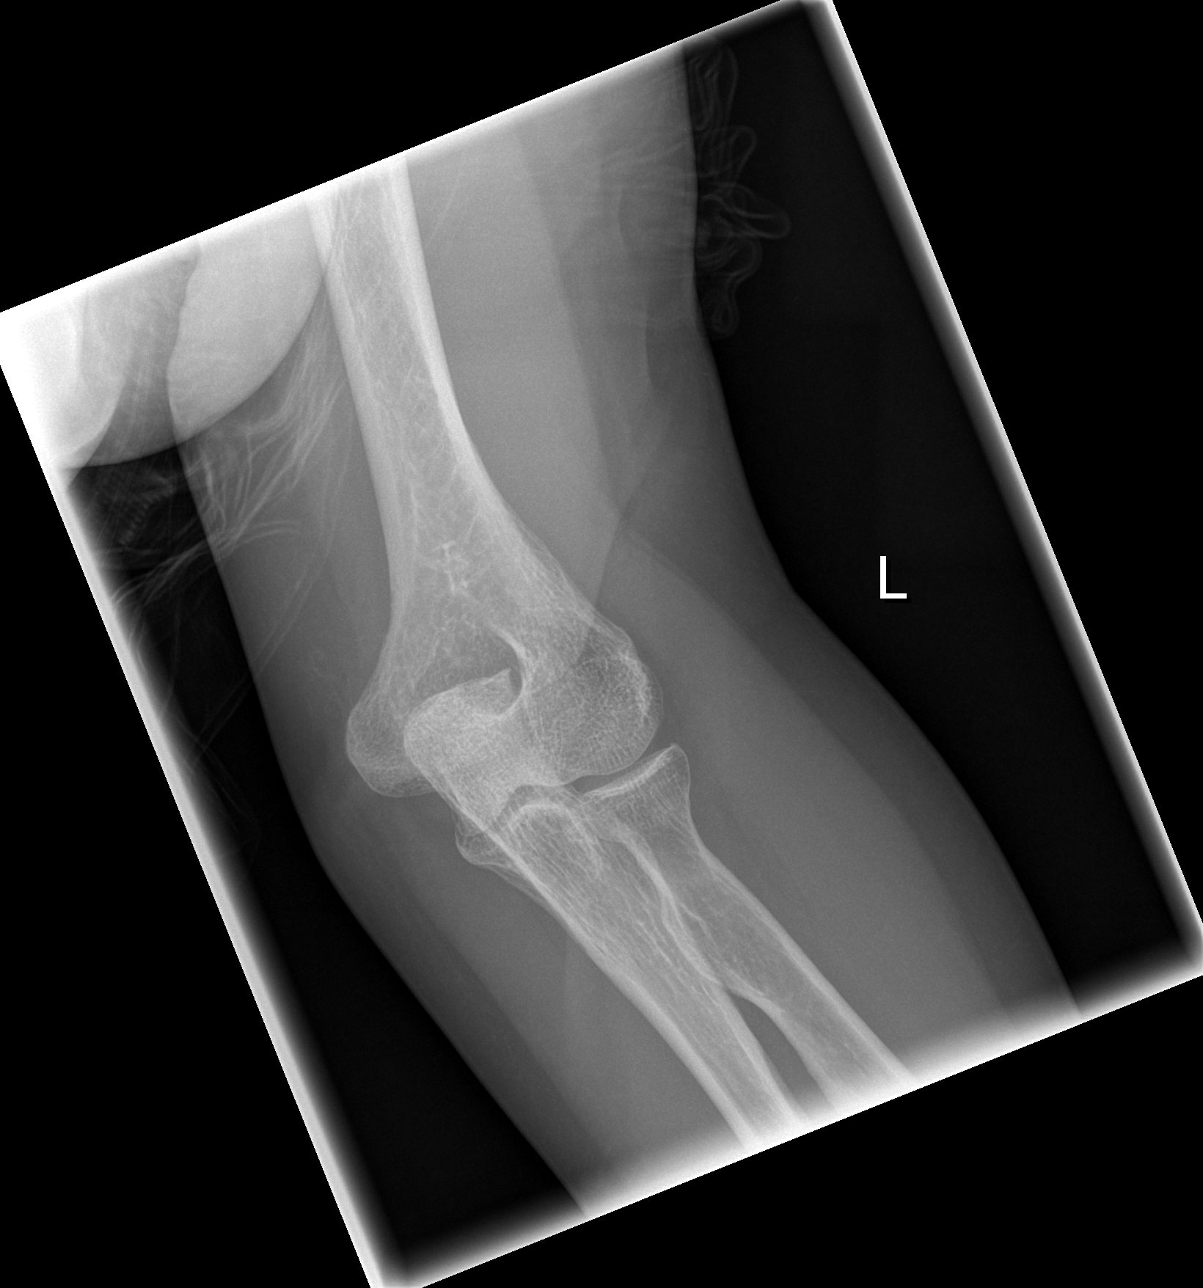

[t elbow (3 of 3)]
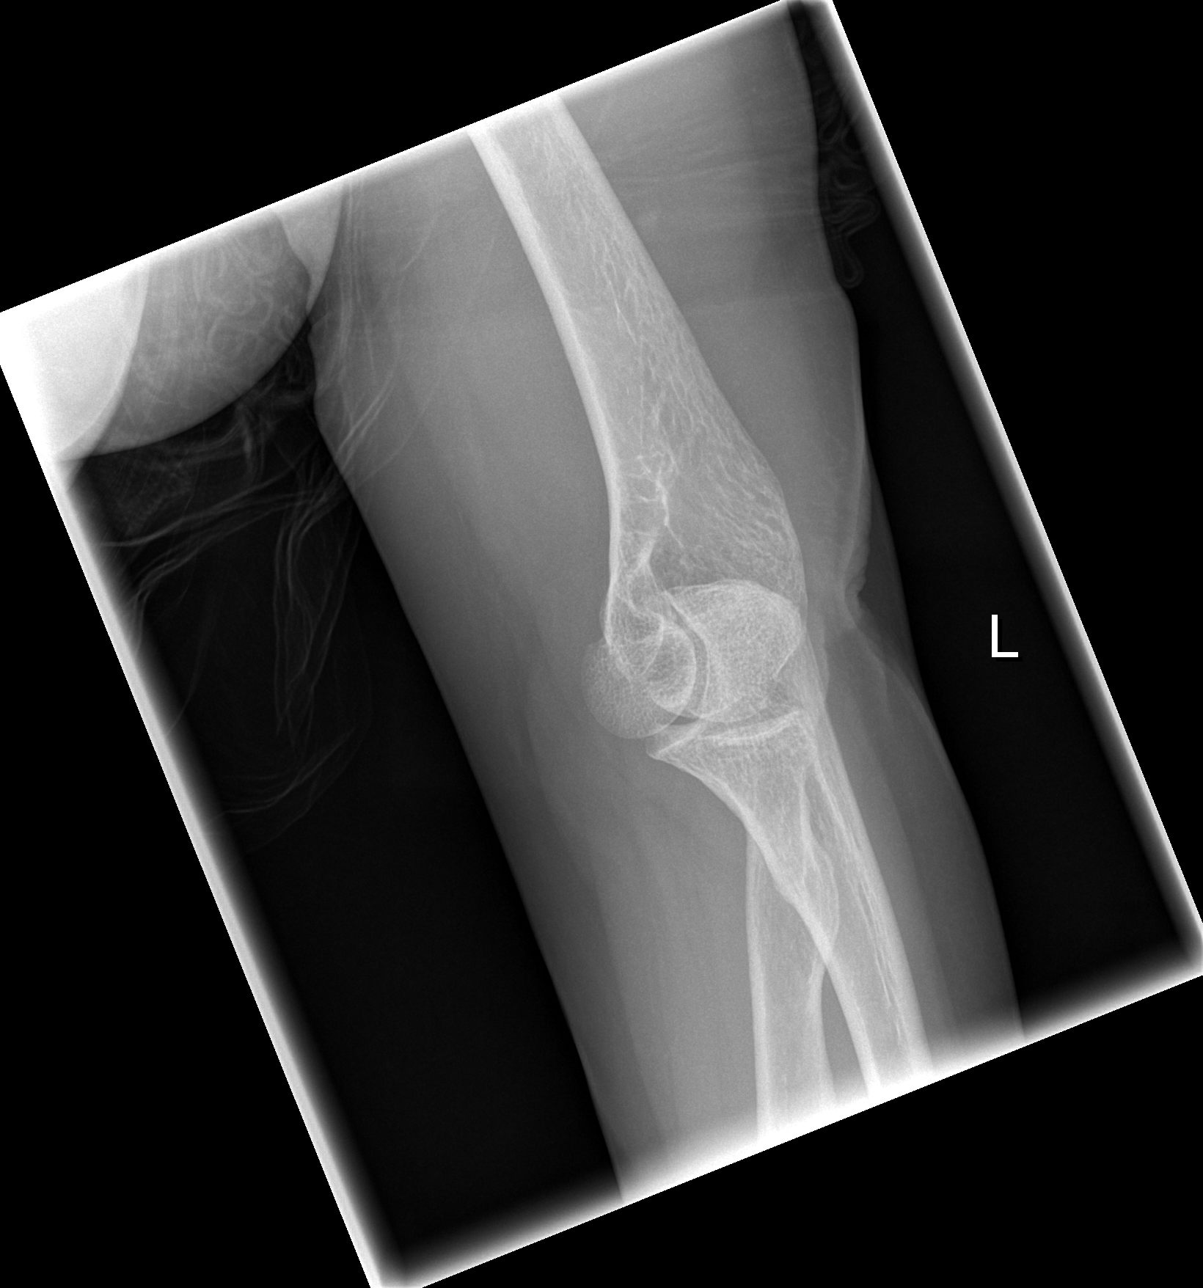

[t elbow *]
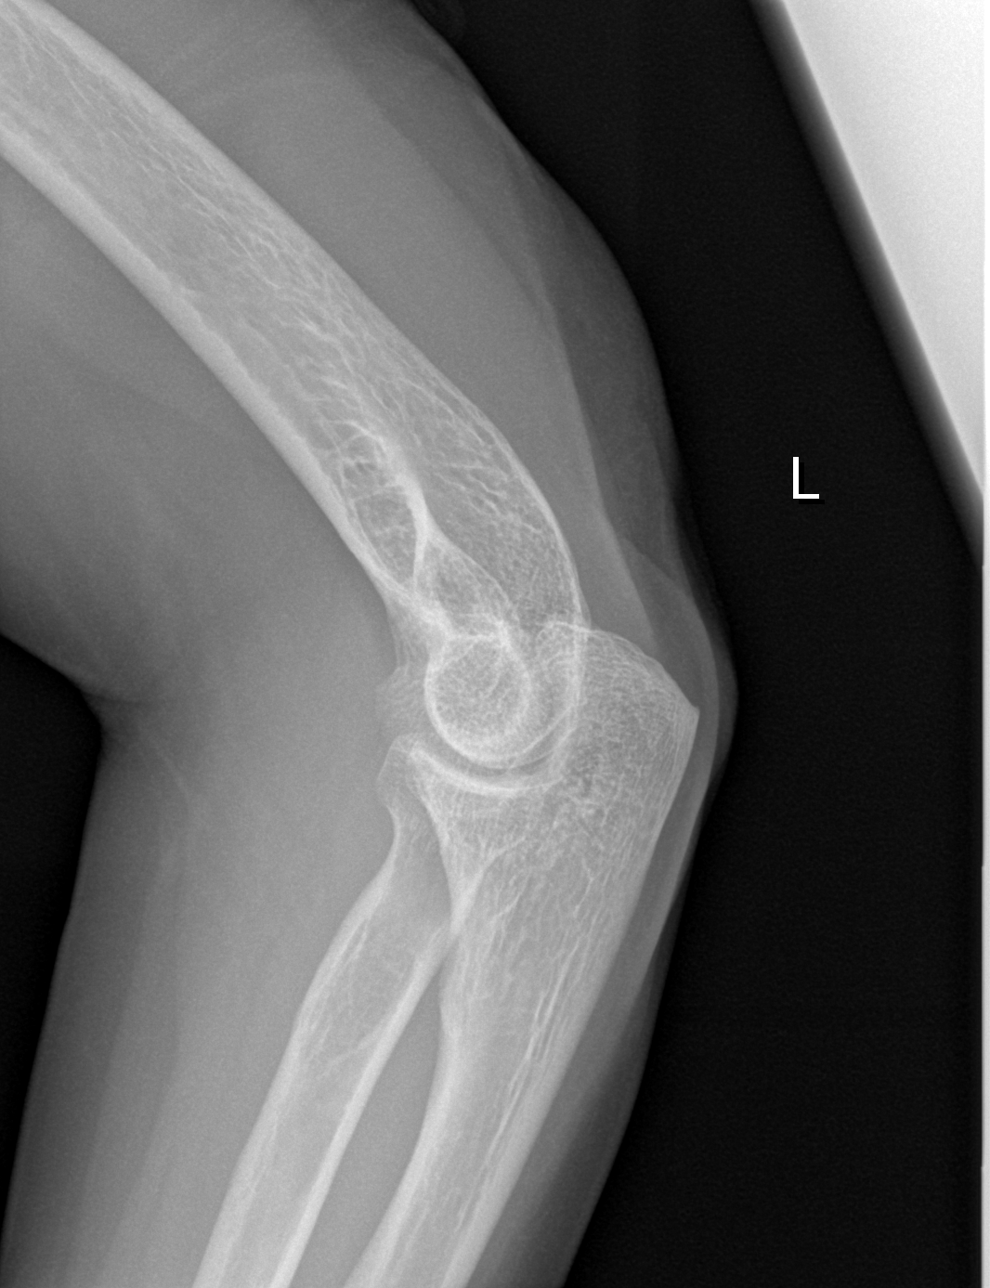

[4 of 4 positions shown; findings below may reference images not displayed]

FINDINGS: No acute bony abnormality.  Specifically, no fracture,
subluxation, or dislocation.  Soft tissues are intact.  No joint
effusion.
IMPRESSION: No acute bony abnormality.

## 2013-07-18 IMAGING — CR DG LUMBAR SPINE COMPLETE 4+V
5 series · 5 of 5 positions shown · non-contrast
Comparison: 06/04/2010

CLINICAL DATA: Fall down stairs.

LUMBAR SPINE - COMPLETE 4+ VIEW

[t l-spine a.p.]
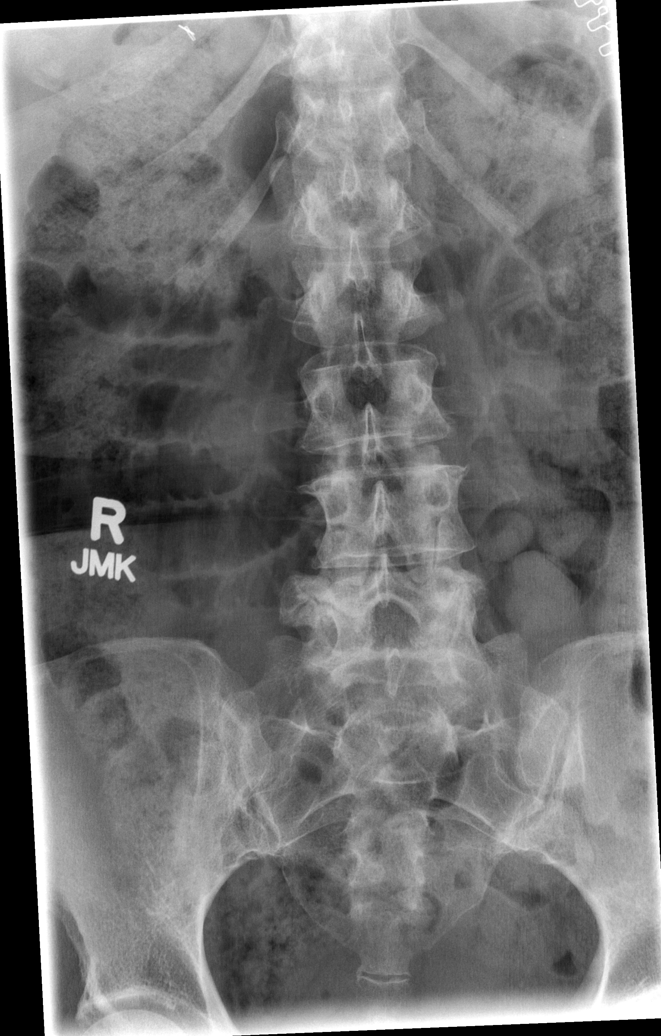

[t l-spine oblique exposure (1 of 2)]
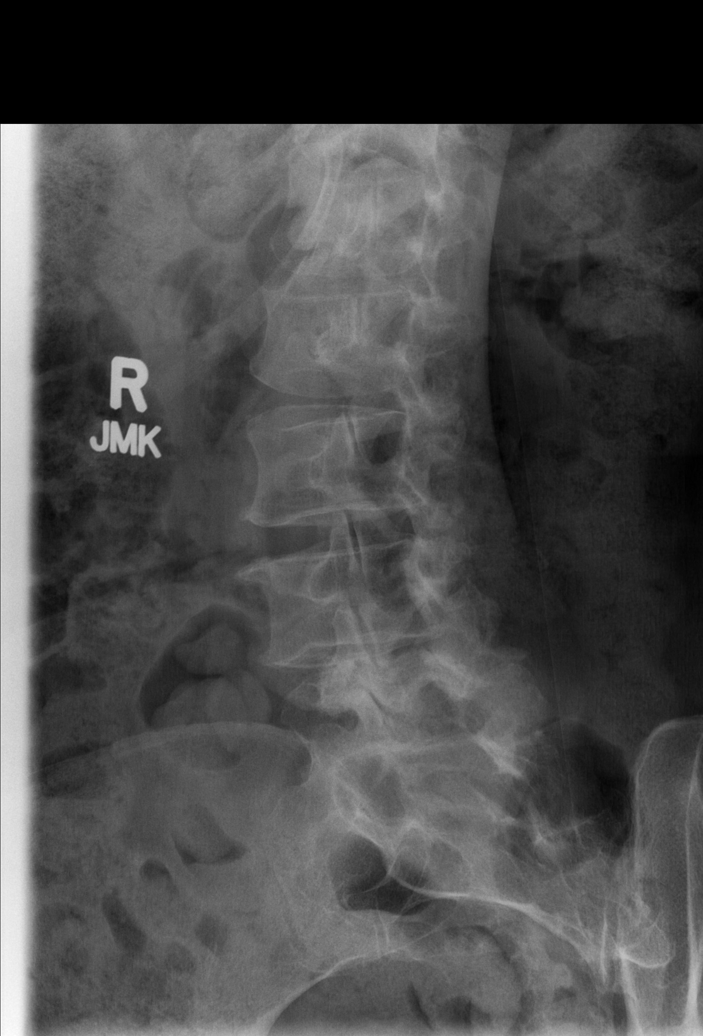

[t l-spine oblique exposure (2 of 2)]
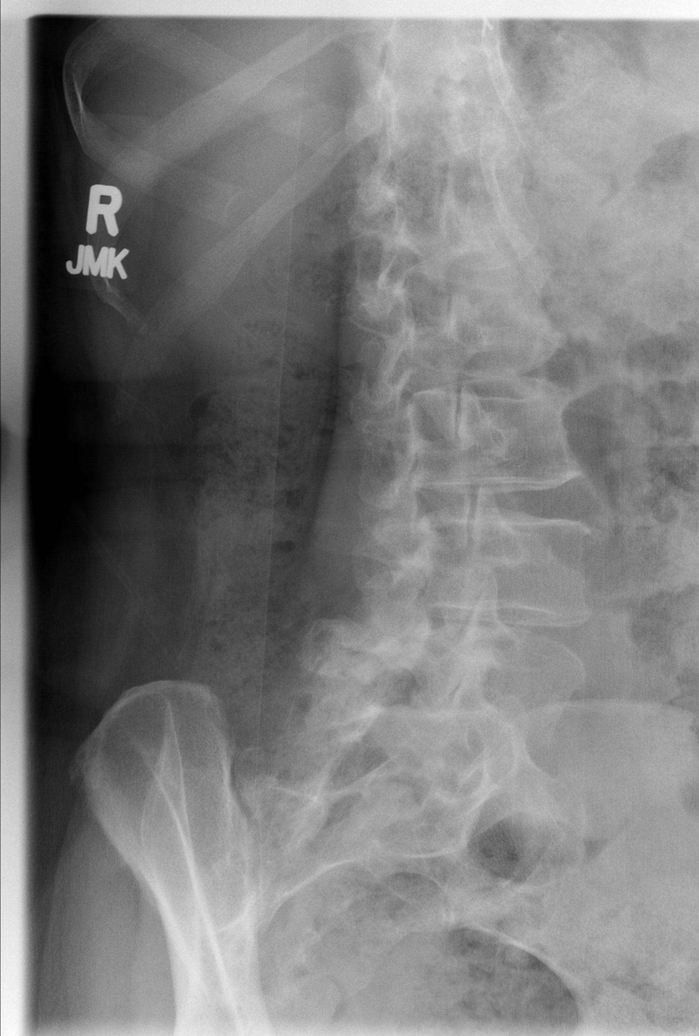

[t l-spine lat]
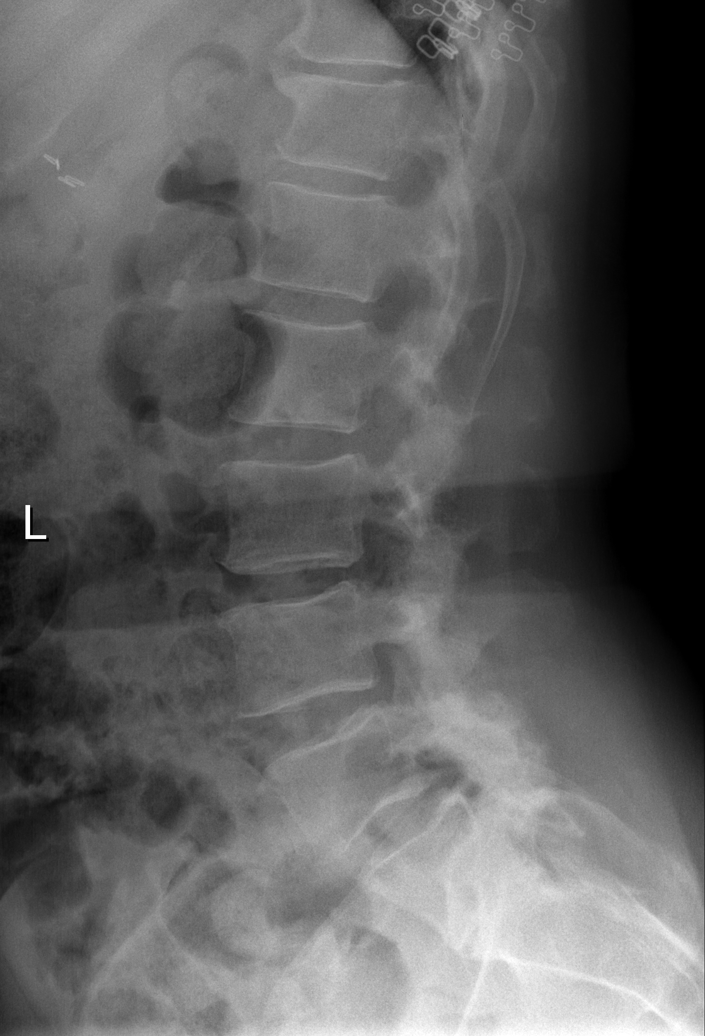

[t l-spine l5-s1 spot]
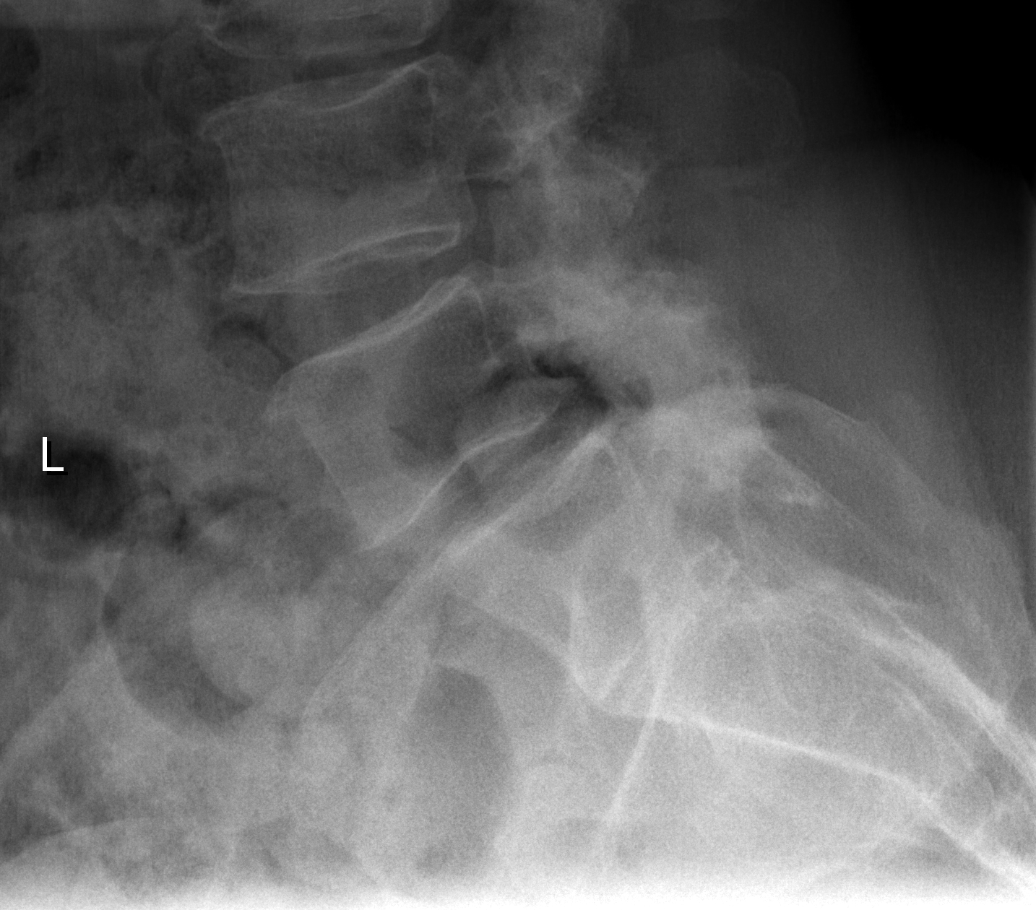

[5 of 5 positions shown; findings below may reference images not displayed]

FINDINGS: Transitional anatomy at the lumbosacral junction.  The
last open disc space will be considered L5-S1 as numbered on prior
study.  Slight anterolisthesis of L5 on S1 is stable, related to
the degenerative facet disease and bony overgrowth.  No acute
malalignment or subluxation.  No fracture.  SI joints are symmetric
and unremarkable.
IMPRESSION: Spondylosis as described above.  No change since prior study.  No
acute findings.

## 2013-07-31 ENCOUNTER — Telehealth: Payer: Self-pay | Admitting: Internal Medicine

## 2013-07-31 NOTE — Telephone Encounter (Signed)
If so, [facility] [phone]

## 2013-07-31 NOTE — Telephone Encounter (Signed)
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 I do not see anything about a referral and nothing in the chart about patient having Prader-Willi Syndrome. Do you recall knowing about this referral? i told Pat, I would have to check with you first..

## 2013-07-31 NOTE — Telephone Encounter (Signed)
I remember this was discussed but I do not remember making the referral. There is no record of her having this syndrome. Call the lady let her know that.

## 2013-07-31 NOTE — Telephone Encounter (Signed)
Annette Bibleat was notified about no records of pt having this syndrome.

## 2017-11-27 DIAGNOSIS — E871 Hypo-osmolality and hyponatremia: Secondary | ICD-10-CM

## 2017-11-27 DIAGNOSIS — R112 Nausea with vomiting, unspecified: Secondary | ICD-10-CM | POA: Diagnosis not present

## 2017-11-28 DIAGNOSIS — E871 Hypo-osmolality and hyponatremia: Secondary | ICD-10-CM | POA: Diagnosis not present

## 2017-11-28 DIAGNOSIS — R112 Nausea with vomiting, unspecified: Secondary | ICD-10-CM | POA: Diagnosis not present

## 2018-04-05 ENCOUNTER — Inpatient Hospital Stay (HOSPITAL_COMMUNITY)
Admission: AD | Admit: 2018-04-05 | Discharge: 2018-04-12 | DRG: 885 | Disposition: A | Discharge status: Home / Self Care | Payer: Medicare PPO | Source: Intra-hospital | Attending: Psychiatry | Admitting: Psychiatry

## 2018-04-05 ENCOUNTER — Other Ambulatory Visit: Payer: Self-pay

## 2018-04-05 ENCOUNTER — Other Ambulatory Visit: Payer: Self-pay | Admitting: Registered Nurse

## 2018-04-05 ENCOUNTER — Encounter (HOSPITAL_COMMUNITY): Payer: Self-pay | Admitting: *Deleted

## 2018-04-05 DIAGNOSIS — F200 Paranoid schizophrenia: Secondary | ICD-10-CM | POA: Diagnosis present

## 2018-04-05 DIAGNOSIS — E1143 Type 2 diabetes mellitus with diabetic autonomic (poly)neuropathy: Secondary | ICD-10-CM | POA: Diagnosis present

## 2018-04-05 DIAGNOSIS — R45851 Suicidal ideations: Secondary | ICD-10-CM | POA: Diagnosis present

## 2018-04-05 DIAGNOSIS — Z993 Dependence on wheelchair: Secondary | ICD-10-CM

## 2018-04-05 DIAGNOSIS — F79 Unspecified intellectual disabilities: Secondary | ICD-10-CM | POA: Diagnosis present

## 2018-04-05 DIAGNOSIS — Z7984 Long term (current) use of oral hypoglycemic drugs: Secondary | ICD-10-CM

## 2018-04-05 DIAGNOSIS — F419 Anxiety disorder, unspecified: Secondary | ICD-10-CM | POA: Diagnosis not present

## 2018-04-05 DIAGNOSIS — Z23 Encounter for immunization: Secondary | ICD-10-CM | POA: Diagnosis present

## 2018-04-05 DIAGNOSIS — Z79899 Other long term (current) drug therapy: Secondary | ICD-10-CM

## 2018-04-05 DIAGNOSIS — I10 Essential (primary) hypertension: Secondary | ICD-10-CM | POA: Diagnosis present

## 2018-04-05 DIAGNOSIS — R45850 Homicidal ideations: Secondary | ICD-10-CM | POA: Diagnosis present

## 2018-04-05 DIAGNOSIS — K210 Gastro-esophageal reflux disease with esophagitis: Secondary | ICD-10-CM | POA: Diagnosis present

## 2018-04-05 DIAGNOSIS — F209 Schizophrenia, unspecified: Secondary | ICD-10-CM | POA: Diagnosis present

## 2018-04-05 DIAGNOSIS — G4700 Insomnia, unspecified: Secondary | ICD-10-CM | POA: Diagnosis present

## 2018-04-05 MED ORDER — ZIPRASIDONE MESYLATE 20 MG IM SOLR: 10.0000 mg | Freq: Two times a day (BID) | INTRAMUSCULAR | Status: DC | PRN

## 2018-04-05 MED ORDER — HYDROXYZINE HCL 25 MG PO TABS
25.0000 mg | ORAL_TABLET | Freq: Four times a day (QID) | ORAL | Status: DC | PRN
  Administered 2018-04-05 – 2018-04-08 (×3): 25 mg via ORAL
  Filled 2018-04-05 (×3): qty 1

## 2018-04-05 MED ORDER — QUETIAPINE FUMARATE 100 MG PO TABS
100.0000 mg | ORAL_TABLET | Freq: Once | ORAL | Status: AC
  Administered 2018-04-05: 100 mg via ORAL
  Filled 2018-04-05 (×2): qty 1

## 2018-04-05 MED ORDER — PANTOPRAZOLE SODIUM 40 MG PO TBEC
40.0000 mg | DELAYED_RELEASE_TABLET | Freq: Every day | ORAL | Status: DC
  Administered 2018-04-06 – 2018-04-12 (×7): 40 mg via ORAL
  Filled 2018-04-05 (×10): qty 1

## 2018-04-05 MED ORDER — RISPERIDONE 1 MG PO TBDP
1.0000 mg | ORAL_TABLET | Freq: Four times a day (QID) | ORAL | Status: DC | PRN
  Administered 2018-04-05: 1 mg via ORAL
  Filled 2018-04-05: qty 1

## 2018-04-05 MED ORDER — PNEUMOCOCCAL VAC POLYVALENT 25 MCG/0.5ML IJ INJ
0.5000 mL | INJECTION | INTRAMUSCULAR | Status: AC
  Administered 2018-04-07: 0.5 mL via INTRAMUSCULAR

## 2018-04-05 NOTE — Progress Notes (Signed)
Nursing 1:1 note D:Pt observed sitting in dayroom watching TV. RR even and unlabored. No distress noted. A: 1:1 observation continues for safety  R: pt remains safe  

## 2018-04-05 NOTE — Progress Notes (Signed)
Annette Martin is a 62 year old female admitted [facility]. On admission, she reports that she has been feeling depressed, suicidal and hearing voices. She reports that she has a female doctor that comes and sees her and reports that she has been taking all her medications as she should but believes they are not working for her. She reports that she has been hospitalized in the past but unable to recall when or where. She denies any substance abuse issues. She spoke about how she gets depressed around the holidays and does endorse passive SI on admission but able to contract for safety while in the hospital. She reports that she lives at [facility] and reports that she likes it there and wants to go back there once she is discharged. Annette Martin reports that she is unsteady in her legs and falls often and reports that she [place] a [place] [facility]. She also reports that she can dress and feed herself but that she does not helping bathing. Annette Martin was escorted to the unit, oriented to the milieu and safety maintained.

## 2018-04-05 NOTE — Tx Team (Signed)
Initial Treatment Plan 04/05/2018 3:14 PM Annette Martin ZOX:[id number]    PATIENT STRESSORS: Health problems Other: "I feel this way around the holidays"   PATIENT STRENGTHS: Ability for insight General fund of knowledge Motivation for treatment/growth   PATIENT IDENTIFIED PROBLEMS: Depression Suicidal thoughts Auditory hallucinations "I take my medications, but I don't think they are working"                     DISCHARGE CRITERIA:  Ability to meet basic life and health needs Improved stabilization in mood, thinking, and/or behavior Reduction of life-threatening or endangering symptoms to within safe limits Verbal commitment to aftercare and medication compliance  PRELIMINARY DISCHARGE PLAN: Attend aftercare/continuing care group Return to previous living arrangement  PATIENT/FAMILY INVOLVEMENT: This treatment plan has been presented to and reviewed with the patient, Annette Martin, and/or family member, .  The patient and family have been given the opportunity to ask questions and make suggestions.  Taunja Brickner, [place], [place] 04/05/2018, 3:14 PM

## 2018-04-05 NOTE — Progress Notes (Signed)
Closed Observation: Patient is alert and oriented to person only.  Patient ambulatory on the unit on wheel chair with assistance.  Patient is anxious and tremulous with unsteady gait.  Patient placed on constant observation due to high fall risk.  Patient is safe on the unit with supervision.

## 2018-04-05 NOTE — BH Assessment (Signed)
Tele Assessment Note   Patient Name: Annette Martin MRN: [id number] Referring Physician: Birdie Riddle Location of Patient: [facility] Location of Provider: Behavioral Health TTS Department  TTS Assessment completed by Josephina Gip, LCAS on 04/04/18: Patient presented to [facility] from [facility] stating that she was suicidal, homicidal, and experiencing auditory hallucinations. Patient states that she is having thoughts of overdosing on pills, stabbing others with knives and states that she is hearing voices to kill people. Patient states that she has five prior suicide attempts and a history of multiple psychiatric hospitalizations. Patient was last hospitalized at [facility] in [place]. Patient states that she has no history of harming others. Patient states that she lives in a facility, [facility], and states that she has been sleeping and eating well at the facility where she lives, but states that she does not currently feel safe there. Patient denies any history of drug or alcohol use, abuse, or self mutilation. Patient presented as depressed with a flat affect. Patient stated she is more depressed during the holiday season because she has no family and states that she is lonely. Patient has a flat and blunted affect. She appears to have some cognitive delay. Patient is oriented and alert. Patient's thoughts are organized. Her stare is fixed. She does not appear to be responding to internal stimuli at present.   Diagnosis: F20.9 Schizophrenia  Past Medical History:  Past Medical History:  Diagnosis Date  . Depression   . Diabetes mellitus   . GERD (gastroesophageal reflux disease)   . Mental retardation   . Thyroid disease   . Vitamin D deficient osteomalacia     Past Surgical History:  Procedure Laterality Date  . COLONOSCOPY  2010   [facility]    Family History: No family history on file.  Social History:  reports that she has  never smoked. She has never used smokeless tobacco. She reports that she does not drink alcohol or use drugs.  Additional Social History:  Alcohol / Drug Use Pain Medications: see MAR Prescriptions: see MAR Over the Counter: see MAR History of alcohol / drug use?: No history of alcohol / drug abuse Longest period of sobriety (when/how long): patient denies  CIWA:   COWS:    Allergies: No Known Allergies  Home Medications:  No medications prior to admission.    OB/GYN Status:  No LMP recorded. Patient is postmenopausal.  General Assessment Data Location of Assessment: [facility] TTS Assessment: Out of system Is this a Tele or Face-to-Face Assessment?: Tele Assessment Is this an Initial Assessment or a Re-assessment for this encounter?: Initial Assessment Patient Accompanied by:: Other(self) Language Other than English: No Living Arrangements: In Assisted Living/Nursing Home (Comment: Name of Nursing Home([facility]) What gender do you identify as?: Female Marital status: Single Maiden name: Heldt Pregnancy Status: No Living Arrangements: Other (Comment)(assisted living) Can pt return to current living arrangement?: Yes Admission Status: Voluntary Is patient capable of signing voluntary admission?: Yes Referral Source: Self/Family/Friend Insurance type: Medicare     Crisis Care Plan Living Arrangements: Other (Comment)(assisted living)     Risk to self with the past 6 months Suicidal Ideation: Yes-Currently Present Has patient been a risk to self within the past 6 months prior to admission? : No Suicidal Intent: Yes-Currently Present Has patient had any suicidal intent within the past 6 months prior to admission? : No Is patient at risk for suicide?: Yes Suicidal Plan?: Yes-Currently Present Has patient had  any suicidal plan within the past 6 months prior to admission? : No Specify Current Suicidal Plan: overdose Access to Means: Yes Specify  Access to Suicidal Means: medications What has been your use of drugs/alcohol within the last 12 months?: patient denies Previous Attempts/Gestures: Yes How many times?: 5 Other Self Harm Risks: none reported Triggers for Past Attempts: Unknown Intentional Self Injurious Behavior: None Family Suicide History: (not assessed) Recent stressful life event(s): Other (Comment)(holidays trigger worsening depression) Persecutory voices/beliefs?: No Depression: Yes Depression Symptoms: Despondent, Feeling worthless/self pity, Loss of interest in usual pleasures Substance abuse history and/or treatment for substance abuse?: No Suicide prevention information given to non-admitted patients: Not applicable  Risk to Others within the past 6 months Homicidal Ideation: Yes-Currently Present Does patient have any lifetime risk of violence toward others beyond the six months prior to admission? : No Thoughts of Harm to Others: Yes-Currently Present Comment - Thoughts of Harm to Others: AH to kill people/ thoughts of stabbing Current Homicidal Intent: No Current Homicidal Plan: No Access to Homicidal Means: No Identified Victim: none History of harm to others?: No Assessment of Violence: On admission Violent Behavior Description: none Does patient have access to weapons?: No Criminal Charges Pending?: No Does patient have a court date: No Is patient on probation?: No  Psychosis Hallucinations: Auditory, With command Delusions: None noted  Mental Status Report Appearance/Hygiene: Unremarkable Eye Contact: Fair Motor Activity: Unremarkable Speech: Logical/coherent, Slow, Soft Level of Consciousness: Alert Mood: Depressed Affect: Flat, Blunted Anxiety Level: Minimal Thought Processes: Coherent, Relevant Judgement: Impaired Orientation: Person, Place, Situation, Time Obsessive Compulsive Thoughts/Behaviors: None  Cognitive Functioning Concentration: Normal Memory: Recent Intact, Remote  Intact Is patient IDD: No Insight: Poor Impulse Control: Poor Appetite: (not assessed) Have you had any weight changes? : No Change Sleep: No Change Total Hours of Sleep: 8 Vegetative Symptoms: None  ADLScreening [facility] Assessment Services) Patient's cognitive ability adequate to safely complete daily activities?: Yes Patient able to express need for assistance with ADLs?: Yes Independently performs ADLs?: Yes (appropriate for developmental age)  Prior Inpatient Therapy Prior Inpatient Therapy: Yes Prior Therapy Dates: (not assessed) Prior Therapy Facilty/Provider(s): [facility]; [facility] Reason for Treatment: depression, suicide attempt  Prior Outpatient Therapy Prior Outpatient Therapy: ([facility]oy [id number])  ADL Screening (condition at time of admission) Patient's cognitive ability adequate to safely complete daily activities?: Yes Is the patient deaf or have difficulty hearing?: No Does the patient have difficulty seeing, even when wearing glasses/contacts?: No Does the patient have difficulty concentrating, remembering, or making decisions?: No Patient able to express need for assistance with ADLs?: Yes Does the patient have difficulty dressing or bathing?: No Independently performs ADLs?: Yes (appropriate for developmental age) Does the patient have difficulty walking or climbing stairs?: No Weakness of Legs: None Weakness of Arms/Hands: None  Home Assistive Devices/Equipment Home Assistive Devices/Equipment: None  Therapy Consults (therapy consults require a physician order) PT Evaluation Needed: No OT Evalulation Needed: No SLP Evaluation Needed: No Abuse/Neglect Assessment (Assessment to be complete while patient is alone) Abuse/Neglect Assessment Can Be Completed: Yes Physical Abuse: Denies Verbal Abuse: Denies Sexual Abuse: Denies Exploitation of patient/patient's resources: Denies Self-Neglect: Denies Values / Beliefs Cultural Requests During  Hospitalization: None Spiritual Requests During Hospitalization: None Consults Spiritual Care Consult Needed: No Social Work Consult Needed: No Merchant navy officer (For Healthcare) Does Patient Have a Medical Advance Directive?: No Would patient like information on creating a medical advance directive?: No - Patient declined  Disposition: Per Reola Calkinsravis Money, NP patient meets in patient criteria. Disposition Initial Assessment Completed for this Encounter: Yes Disposition of Patient: Admit Type of inpatient treatment program: Adult Patient refused recommended treatment: No Mode of transportation if patient is discharged?: Car Patient referred to: Other (Comment)([facility])  This service was provided via telemedicine using a 2-way, interactive audio and video technology.  Names of all persons participating in this telemedicine service and their role in this encounter. Name: Annette Martin Role: patient  Name: Annette Martin Role: LCAS  Name:  Role:   Name:  Role:     Annette Martin  Annette Martin 04/05/2018 12:18 PM

## 2018-04-05 NOTE — Progress Notes (Signed)
D: Pt endorses SI/HI/AVH. Pt is pleasant and cooperative. Pt presents very lonely and depressed. Pt appears to like the attention. Pt can do a lot more for herself, but if you do a lot for her she will allow you to do it. Pt needs to be encouraged to do as much as possible while maintaining her safety. Pt does have lower leg weakness.   A: Pt was offered support and encouragement. Pt was given scheduled medications. Pt was encourage to attend groups. Q 15 minute checks were done for safety.  R:Pt attends groups and interacts well with peers and staff. Pt is taking medication. Pt has no complaints.Pt receptive to treatment and safety maintained on unit.  Problem: Activity: Goal: Sleeping patterns will improve Outcome: Progressing   Problem: Safety: Goal: Periods of time without injury will increase Outcome: Progressing

## 2018-04-05 NOTE — Progress Notes (Signed)
Closed Observation: Patient placed on closed observation due to high fall risk.  Patient presents with anxious affect and [facility]  Routine safety checks maintained every 15 minutes.  Ambulatory on wheel chair with assistance.  Support and encouragement offered as needed.  Patient is safe on the unit.

## 2018-04-05 NOTE — Progress Notes (Signed)
Adult Psychoeducational Group Note  Date:  04/05/2018 Time:  9:39 PM  Group Topic/Focus:  Wrap-Up Group:   The focus of this group is to help patients review their daily goal of treatment and discuss progress on daily workbooks.  Participation Level:  Active  Participation Quality:  Appropriate  Affect:  Appropriate  Cognitive:  Appropriate  Insight: Appropriate  Engagement in Group:  Engaged  Modes of Intervention:  Discussion  Additional Comments:  Pt stated her goal for today was to focus on her treatment and talk with her doctor about discharge. Pt stated she accomplished her goal today. Pt stated she did not attend any groups today but due plan on attending them tomorrow. Pt stated the phone conversation with her family help improve her day.  Annette FurnaceChristopher  [name] [name] 04/05/2018, 9:39 PM

## 2018-04-06 DIAGNOSIS — G4700 Insomnia, unspecified: Secondary | ICD-10-CM

## 2018-04-06 DIAGNOSIS — F200 Paranoid schizophrenia: Principal | ICD-10-CM

## 2018-04-06 DIAGNOSIS — F419 Anxiety disorder, unspecified: Secondary | ICD-10-CM

## 2018-04-06 DIAGNOSIS — R45851 Suicidal ideations: Secondary | ICD-10-CM

## 2018-04-06 LAB — URINALYSIS, ROUTINE W REFLEX MICROSCOPIC
BILIRUBIN URINE: NEGATIVE
Glucose, UA: 50 mg/dL — AB
HGB URINE DIPSTICK: NEGATIVE
KETONES UR: NEGATIVE mg/dL
Leukocytes, UA: NEGATIVE
Nitrite: NEGATIVE
Protein, ur: NEGATIVE mg/dL
Specific Gravity, Urine: 1.01 (ref 1.005–1.030)
pH: 6 (ref 5.0–8.0)

## 2018-04-06 MED ORDER — BENZTROPINE MESYLATE 1 MG PO TABS
1.0000 mg | ORAL_TABLET | Freq: Two times a day (BID) | ORAL | Status: DC
  Administered 2018-04-06 – 2018-04-07 (×2): 1 mg via ORAL
  Filled 2018-04-06 (×4): qty 1

## 2018-04-06 MED ORDER — TEMAZEPAM 15 MG PO CAPS
15.0000 mg | ORAL_CAPSULE | Freq: Every day | ORAL | Status: DC
  Administered 2018-04-06: 15 mg via ORAL
  Filled 2018-04-06: qty 1

## 2018-04-06 MED ORDER — RISPERIDONE 2 MG PO TBDP
2.0000 mg | ORAL_TABLET | Freq: Two times a day (BID) | ORAL | Status: DC
  Administered 2018-04-06 – 2018-04-07 (×2): 2 mg via ORAL
  Filled 2018-04-06 (×4): qty 1

## 2018-04-06 NOTE — Progress Notes (Signed)
  Problem: Education: Goal: Emotional status will improve Outcome: Progressing   Problem: Activity: Goal: Interest or engagement in activities will improve Outcome: Progressing   Problem: Activity: Goal: Sleeping patterns will improve Outcome: Progressing

## 2018-04-06 NOTE — Progress Notes (Signed)
Adult Psychoeducational Group Note  Date:  04/06/2018 Time:  9:40 PM  Group Topic/Focus:  Wrap-Up Group:   The focus of this group is to help patients review their daily goal of treatment and discuss progress on daily workbooks.  Participation Level:  Active  Participation Quality:  Appropriate  Affect:  Appropriate  Cognitive:  Appropriate  Insight: Appropriate  Engagement in Group:  Engaged  Modes of Intervention:  Discussion  Additional Comments:  The patient expressed that she rates today a 8.The patient also said that she attended group.  Octavio Mannshigpen, Jorgeluis Gurganus Lee 04/06/2018, 9:40 PM

## 2018-04-06 NOTE — Progress Notes (Signed)
Patient has been free of falls and incidents of behavioral dyscontrol this shift.   Patient currently endorses SI, but is able to contract for safety.  Patient has attended groups and with assistance of staff completed adls.   Assess patient for safety offer medications as prescribed, engage patient in 1:1 staff talks.   Continue to monitor as planned. Patient able to contract for safety.  

## 2018-04-06 NOTE — Tx Team (Addendum)
Interdisciplinary Treatment and Diagnostic Plan Update  04/07/2018 Time of Session: [place] MRN: [id number]  Principal Diagnosis: <principal problem not specified>  Secondary Diagnoses: Active Problems:   Schizophrenia ([place])   Current Medications:  Current Facility-Administered Medications  Medication Dose Route Frequency Provider Last Rate Last Dose  . benztropine (COGENTIN) tablet 0.5 mg  0.5 mg Oral BID Johnn Hai, MD      . clonazePAM Bobbye [place]) tablet 0.5 mg  0.5 mg Oral TID Johnn Hai, MD   0.5 mg at 04/07/18 1201  . Dextromethorphan-quiNIDine 20-10 MG CAPS 1 tablet  1 tablet Oral BID Johnn Hai, MD      . hydrOXYzine (ATARAX/VISTARIL) tablet 25 mg  25 mg Oral Q6H PRN Sharma Covert, MD   25 mg at 04/07/18 0420  . lisinopril (PRINIVIL,ZESTRIL) tablet 20 mg  20 mg Oral Daily Johnn Hai, MD   20 mg at 04/07/18 1200  . metFORMIN (GLUCOPHAGE) tablet 1,000 mg  1,000 mg Oral BID WC Johnn Hai, MD      . pantoprazole (PROTONIX) EC tablet 40 mg  40 mg Oral Daily Lindon Romp A, NP   40 mg at 04/07/18 0720  . [START ON 04/08/2018] risperiDONE (RISPERDAL M-TABS) disintegrating tablet 2 mg  2 mg Oral Daily Johnn Hai, MD      . risperiDONE (RISPERDAL M-TABS) disintegrating tablet 3 mg  3 mg Oral QHS Johnn Hai, MD      . temazepam (RESTORIL) capsule 30 mg  30 mg Oral QHS Johnn Hai, MD      . ziprasidone (GEODON) injection 10 mg  10 mg Intramuscular Q12H PRN Sharma Covert, MD       PTA Medications: Medications Prior to Admission  Medication Sig Dispense Refill Last Dose  . atorvastatin (LIPITOR) 20 MG tablet Take 1 tablet (20 mg total) by mouth daily. 90 tablet 0 04/05/2018 at Unknown time  . bethanechol (URECHOLINE) 25 MG tablet Take 25 mg by mouth 3 (three) times daily.   Past Week at Unknown time  . clonazePAM (KLONOPIN) 0.5 MG tablet Take 0.5 mg by mouth 3 (three) times daily.   04/05/2018 at Unknown time  . Dextromethorphan-quiNIDine (NUEDEXTA)  20-10 MG CAPS Take 1 capsule by mouth 2 (two) times daily.   04/05/2018 at Unknown time  . fesoterodine (TOVIAZ) 4 MG TB24 tablet Take 4 mg by mouth at bedtime.   04/05/2018 at Unknown time  . haloperidol (HALDOL) 5 MG tablet Take 5 mg by mouth 2 (two) times daily.   04/05/2018 at Unknown time  . lisinopril (PRINIVIL,ZESTRIL) 20 MG tablet Take 20 mg by mouth daily.   04/05/2018 at Unknown time  . metFORMIN (GLUCOPHAGE) 1000 MG tablet Take 1,000 mg by mouth 2 (two) times daily with a meal.   04/05/2018 at Unknown time  . metoCLOPramide (REGLAN) 5 MG tablet Take 5 mg by mouth 3 (three) times daily before meals.   04/05/2018 at Unknown time  . ondansetron (ZOFRAN-ODT) 4 MG disintegrating tablet Take 4 mg by mouth every 6 (six) hours as needed for nausea or vomiting.   Past Month at Unknown time  . pantoprazole (PROTONIX) 40 MG tablet Take 40 mg by mouth 2 (two) times daily.   04/05/2018 at Unknown time  . polyethylene glycol (MIRALAX / GLYCOLAX) packet Take 17 g by mouth daily.   04/05/2018 at Unknown time  . tolterodine (DETROL LA) 4 MG 24 hr capsule Take 4 mg by mouth 2 (two) times daily.   04/05/2018 at Unknown  time    Patient Stressors: Health problems Other: "I feel this way around the holidays"  Patient Strengths: Ability for insight General fund of knowledge Motivation for treatment/growth  Treatment Modalities: Medication Management, Group therapy, Case management,  1 to 1 session with clinician, Psychoeducation, Recreational therapy.   Physician Treatment Plan for Primary Diagnosis: <principal problem not specified> Long Term Goal(s): Improvement in symptoms so as ready for discharge Improvement in symptoms so as ready for discharge   Short Term Goals: Ability to identify changes in lifestyle to reduce recurrence of condition will improve Ability to verbalize feelings will improve Ability to identify changes in lifestyle to reduce recurrence of condition will improve Ability to  verbalize feelings will improve Ability to disclose and discuss suicidal ideas  Medication Management: Evaluate patient's response, side effects, and tolerance of medication regimen.  Therapeutic Interventions: 1 to 1 sessions, Unit Group sessions and Medication administration.  Evaluation of Outcomes: Not Met  Physician Treatment Plan for Secondary Diagnosis: Active Problems:   Schizophrenia ([place])  Long Term Goal(s): Improvement in symptoms so as ready for discharge Improvement in symptoms so as ready for discharge   Short Term Goals: Ability to identify changes in lifestyle to reduce recurrence of condition will improve Ability to verbalize feelings will improve Ability to identify changes in lifestyle to reduce recurrence of condition will improve Ability to verbalize feelings will improve Ability to disclose and discuss suicidal ideas     Medication Management: Evaluate patient's response, side effects, and tolerance of medication regimen.  Therapeutic Interventions: 1 to 1 sessions, Unit Group sessions and Medication administration.  Evaluation of Outcomes: Not Met   RN Treatment Plan for Primary Diagnosis: <principal problem not specified> Long Term Goal(s): Knowledge of disease and therapeutic regimen to maintain health will improve  Short Term Goals: Ability to identify and develop effective coping behaviors will improve and Compliance with prescribed medications will improve  Medication Management: RN will administer medications as ordered by provider, will assess and evaluate patient's response and provide education to patient for prescribed medication. RN will report any adverse and/or side effects to prescribing provider.  Therapeutic Interventions: 1 on 1 counseling sessions, Psychoeducation, Medication administration, Evaluate responses to treatment, Monitor vital signs and CBGs as ordered, Perform/monitor CIWA, COWS, AIMS and Fall Risk screenings as ordered, Perform  wound care treatments as ordered.  Evaluation of Outcomes: Not Met   LCSW Treatment Plan for Primary Diagnosis: <principal problem not specified> Long Term Goal(s): Safe transition to appropriate next level of care at discharge, Engage patient in therapeutic group addressing interpersonal concerns.  Short Term Goals: Engage patient in aftercare planning with referrals and resources, Increase social support and Increase skills for wellness and recovery  Therapeutic Interventions: Assess for all discharge needs, 1 to 1 time with Social worker, Explore available resources and support systems, Assess for adequacy in community support network, Educate family and significant other(s) on suicide prevention, Complete Psychosocial Assessment, Interpersonal group therapy.  Evaluation of Outcomes: Not Met   Progress in Treatment: Attending groups: No. Participating in groups: No. Taking medication as prescribed: Yes. Toleration medication: Yes. Family/Significant other contact made: No, will contact:  when given permission Patient understands diagnosis: No. Discussing patient identified problems/goals with staff: Yes. Medical problems stabilized or resolved: Yes. Denies suicidal/homicidal ideation: Yes. Issues/concerns per patient self-inventory: No. Other: none  New problem(s) identified: No, Describe:  none  New Short Term/Long Term Goal(s):  Patient Goals:  "to feel much better"  Discharge Plan or Barriers:  Reason for Continuation of Hospitalization: Depression Hallucinations Medication stabilization  Estimated Length of Stay: 3-5 days.  Attendees: Patient:Annette Martin 04/06/2018   Physician: Dr. Jake Samples, MD 04/06/2018   Nursing: Neldon Newport, RN 04/06/2018   RN Care Manager: 04/06/2018   Social Worker: Lurline Idol, LCSW 04/06/2018   Recreational Therapist:  04/06/2018   Other:  04/06/2018   Other:  04/06/2018   Other: 04/06/2018        Scribe for Treatment  Team: Joanne Chars, [place] 04/07/2018 1:07 PM

## 2018-04-06 NOTE — Progress Notes (Signed)
Nursing 1:1 note D:Pt observed sitting in bed with eyes open. RR even and unlabored. No distress noted. A: 1:1 observation continues for safety  R: pt remains safe  

## 2018-04-06 NOTE — Progress Notes (Signed)
Recreation Therapy Notes  Date: 11.20.19 Time: 1000 Location: [address] Dayroom  Group Topic: Goal Setting  Goal Area(s) Addresses:  Patient will be able to identify at least 3 life goals.  Patient will be able to identify benefit of investing in life goal.  Patient will be able to identify benefit of setting life goals.   Behavioral Response:  Engaged  Intervention: Worksheet  Activity: Garment/textile technologistGoal Planning.  Patients were to identify goals they wish to accomplish in the next week, month, year and five years.  Patients were to also identify obstacles they would hinder their goals, things they need to achieve their goals and what they can do immediately to work towards their goals.  Education:  Discharge Planning, PharmacologistCoping Skills, Leisure Education   Education Outcome: Acknowledges Education/In Group Clarification Provided/Needs Additional Education  Clinical Observations: Pt stated her ultimate goal was to "get out and go home".  Pt expressed in a week she wants to "not get angry"; month- be nice to her brother; 1 yr- continue not to be angry and in 5 yrs- develop more coping skills.  Pt expressed her obstacles were overcoming getting upset; what she needs to succeed is to focus on things that are important and not get upset and what she can start doing is communicate better with people.     Caroll RancherMarjette Delta Martin, LRT/CTRS    Caroll RancherLindsay, Treveon Bourcier A 04/06/2018 11:56 AM

## 2018-04-06 NOTE — Therapy (Signed)
Occupational Therapy Group Note  Date:  04/06/2018 Time:  12:14 PM  Group Topic/Focus:  Stress Management  Participation Level:  Active  Participation Quality:  Appropriate  Affect:  Blunted  Cognitive:  Appropriate  Insight: Improving  Engagement in Group:  Engaged  Modes of Intervention:  Activity, Discussion, Education and Socialization  Additional Comments:    S: "Watching movies in my favorite thing to do when I am stressed"  O: Education given on stress management as it applies to healthy coping strategies to apply when reintegrating into community. Pt to facilitate list of healthy coping strategies with group discussion. Art activity then to be completed for pt to have visually appealing manner to display newly learned skills for continued carry over.  A: Pt presents to group with blunted affect, engaged and participatory throughout session. Pt sharing that watching movies and hot showers/baths are preferred ways to relieve stress. Pt presenting using w/c, needing min A to engage in activity due to noted tremors and difficulty with fine motor. Pt is on 1:1 at this time, NT assisting pt throughout craft. Pt engaged in art activity, with improved affect noted at end of session. Pt very organized and engaged throughout activity.  P: OT will continue to follow up while pt acute.  Dalphine HandingKaylee Rylei Codispoti, MSOT, OTR/L Behavioral Health OT/ Acute Relief OT PHP Office: [phone]  Dalphine HandingKaylee Birdena Kingma 04/06/2018, 12:14 PM

## 2018-04-06 NOTE — Progress Notes (Signed)
Patient has been free of falls and incidents of behavioral dyscontrol this shift. Patient currently endorses SI, but is able to contract for safety. Patient has attended groups and with assistance of staff completed adls.    

## 2018-04-06 NOTE — Progress Notes (Signed)
Patient has been free of falls and incidents of behavioral dyscontrol this shift.   Patient currently endorses SI, but is able to contract for safety.  Patient has attended groups and with assistance of staff completed adls.   Assess patient for safety offer medications as prescribed, engage patient in 1:1 staff talks.   Continue to monitor as planned. Patient able to contract for safety.

## 2018-04-06 NOTE — Progress Notes (Signed)
Nursing 1:1 note D:Pt observed sleeping in bed with eyes closed. RR even and unlabored. No distress noted. A: 1:1 observation continues for safety  R: pt remains safe  

## 2018-04-06 NOTE — Progress Notes (Signed)
Close obs note D:Pt observed sitting in dayroom at this time. RR even and unlabored. No distress noted.Pt passive SI/ AVH- contracts for safety. Pt has been appropriate on the unit this evening.  A: 1:1 observation continues for safety  R: pt remains safe

## 2018-04-06 NOTE — H&P (Signed)
Psychiatric Admission Assessment Adult  Patient Identification: Annette Martin MRN:  [id number] Date of Evaluation:  04/06/2018 Chief Complaint:  SCHIZOPHRENIA /SI Principal Diagnosis: Chronic paranoid schizophrenia Diagnosis:  Active Problems:   Schizophrenia (H[facility])  History of Present Illness:   Annette Martin is 62 years of age admitted to the [facility] system, she acknowledges depression, reported auditory hallucinations, and suicidal thoughts.  She has an extensive psychiatric history and numerous prior admissions.  She is a resident at [facility] assisted living facility she is expected to go back there upon discharge.  At baseline she does use a wheelchair due to generalized unsteadiness.  Patient reported not only suicidal but homicidal thoughts on her initial screening she denies homicidal thoughts to me just stating she is a thoughts of overdosing on pills, although had thoughts of stabbing other residents she has had extensive treatment has 5 prior attempts at harming herself she states she is never harmed others in the past.  She states she feels safe when someone is watching her but does not want to be unsupervised at the present time.  She has marked akathisia she is on Haldol at baseline    Associated Signs/Symptoms: Depression Symptoms:  As above (Hypo) Manic Symptoms:  Hallucinations, Anxiety Symptoms:  Excessive Worry, Psychotic Symptoms:  Hallucinations: Auditory PTSD Symptoms: Negative Total Time spent with patient: 45 minutes  Past Psychiatric History: extensive  Is the patient at risk to self? Yes.    Has the patient been a risk to self in the past 6 months? Yes.    Has the patient been a risk to self within the distant past? Yes.    Is the patient a risk to others? No.  Has the patient been a risk to others in the past 6 months? No.  Has the patient been a risk to others within the distant past? No.   Prior Inpatient Therapy: Prior Inpatient Therapy:  Yes Prior Therapy Dates: (not assessed) Prior Therapy Facilty/Provider(s): [facility]; [facility] Reason for Treatment: depression, suicide attempt Prior Outpatient Therapy: Prior Outpatient Therapy: (noy [id number])  Alcohol Screening: 1. How often do you have a drink containing alcohol?: Never 2. How many drinks containing alcohol do you have on a typical day when you are drinking?: 1 or 2 3. How often do you have six or more drinks on one occasion?: Never AUDIT-C Score: 0 4. How often during the last year have you found that you were not able to stop drinking once you had started?: Never 5. How often during the last year have you failed to do what was normally expected from you becasue of drinking?: Never 6. How often during the last year have you needed a first drink in the morning to get yourself going after a heavy drinking session?: Never 7. How often during the last year have you had a feeling of guilt of remorse after drinking?: Never 8. How often during the last year have you been unable to remember what happened the night before because you had been drinking?: Never 9. Have you or someone else been injured as a result of your drinking?: No 10. Has a relative or friend or a doctor or another health worker been concerned about your drinking or suggested you cut down?: No Alcohol Use Disorder Identification Test Final Score (AUDIT): 0 Intervention/Follow-up: AUDIT Score <7 follow-up not indicated Substance Abuse History in the last 12 months:  No. Consequences of Substance Abuse: Negative Previous Psychotropic Medications: yes Psychological Evaluations: No  Past  Medical History:  Past Medical History:  Diagnosis Date  . Depression   . Diabetes mellitus   . GERD (gastroesophageal reflux disease)   . Mental retardation   . Thyroid disease   . Vitamin D deficient osteomalacia     Past Surgical History:  Procedure Laterality Date  . COLONOSCOPY  2010   MANN   Family  History: History reviewed. No pertinent family history. Family Psychiatric  History: ukn Tobacco Screening: Have you used any form of tobacco in the last 30 days? (Cigarettes, Smokeless Tobacco, Cigars, and/or Pipes): No Social History:  Social History   Substance and Sexual Activity  Alcohol Use No     Social History   Substance and Sexual Activity  Drug Use No    Additional Social History: Marital status: Single    Pain Medications: see MAR Prescriptions: see MAR Over the Counter: see MAR History of alcohol / drug use?: No history of alcohol / drug abuse Longest period of sobriety (when/how long): patient denies                    Allergies:  No Known Allergies Lab Results: No results found for this or any previous visit (from the past 48 hour(s)).  Blood Alcohol level:  Lab Results  Component Value Date   [facility]  [facility]    [facility]10        LOWEST DETECTABLE LIMIT FOR SERUM ALCOHOL IS 5 mg/dL FOR MEDICAL PURPOSES ONLY   ETH  11/06/2007    <5        LOWEST DETECTABLE LIMIT FOR SERUM ALCOHOL IS 11 mg/dL FOR MEDICAL PURPOSES ONLY    Metabolic Disorder Labs:  Lab Results  Component Value Date   HGBA1C 5.9 08/03/2012   MPG 114 05/28/2011   MPG 117 (H) 11/03/2010   No results found for: PROLACTIN Lab Results  Component Value Date   CHOL 162 08/03/2012   TRIG 177 (H) 08/03/2012   HDL 48 08/03/2012   CHOLHDL 3.4 08/03/2012   VLDL 35 08/03/2012   LDLCALC 79 08/03/2012   LDLCALC 51 05/28/2011    Current Medications: Current Facility-Administered Medications  Medication Dose Route Frequency Provider Last Rate Last Dose  . hydrOXYzine (ATARAX/VISTARIL) tablet 25 mg  25 mg Oral Q6H PRN Antonieta Pertlary, Greg Lawson, MD   25 mg at 04/05/18 2117  . pantoprazole (PROTONIX) EC tablet 40 mg  40 mg Oral Daily Nira ConnBerry, Jason A, NP   40 mg at 04/06/18 0749  . pneumococcal 23 valent vaccine (PNU-IMMUNE) injection 0.5 mL  0.5 mL Intramuscular Tomorrow-1000 Malvin JohnsFarah, Lynnda Wiersma, MD      .  risperiDONE (RISPERDAL M-TABS) disintegrating tablet 1 mg  1 mg Oral QID PRN Antonieta Pertlary, Greg Lawson, MD   1 mg at 04/05/18 2117  . ziprasidone (GEODON) injection 10 mg  10 mg Intramuscular Q12H PRN Antonieta Pertlary, Greg Lawson, MD       PTA Medications: Medications Prior to Admission  Medication Sig Dispense Refill Last Dose  . atorvastatin (LIPITOR) 20 MG tablet Take 1 tablet (20 mg total) by mouth daily. 90 tablet 0 04/05/2018 at Unknown time  . bethanechol (URECHOLINE) 25 MG tablet Take 25 mg by mouth 3 (three) times daily.   Past Week at Unknown time  . clonazePAM (KLONOPIN) 0.5 MG tablet Take 0.5 mg by mouth 3 (three) times daily.   04/05/2018 at Unknown time  . Dextromethorphan-quiNIDine (NUEDEXTA) 20-10 MG CAPS Take 1 capsule by mouth 2 (two) times daily.   04/05/2018 at Unknown  time  . fesoterodine (TOVIAZ) 4 MG TB24 tablet Take 4 mg by mouth at bedtime.   04/05/2018 at Unknown time  . haloperidol (HALDOL) 5 MG tablet Take 5 mg by mouth 2 (two) times daily.   04/05/2018 at Unknown time  . lisinopril (PRINIVIL,ZESTRIL) 20 MG tablet Take 20 mg by mouth daily.   04/05/2018 at Unknown time  . metFORMIN (GLUCOPHAGE) 1000 MG tablet Take 1,000 mg by mouth 2 (two) times daily with a meal.   04/05/2018 at Unknown time  . metoCLOPramide (REGLAN) 5 MG tablet Take 5 mg by mouth 3 (three) times daily before meals.   04/05/2018 at Unknown time  . ondansetron (ZOFRAN-ODT) 4 MG disintegrating tablet Take 4 mg by mouth every 6 (six) hours as needed for nausea or vomiting.   Past Month at Unknown time  . pantoprazole (PROTONIX) 40 MG tablet Take 40 mg by mouth 2 (two) times daily.   04/05/2018 at Unknown time  . polyethylene glycol (MIRALAX / GLYCOLAX) packet Take 17 g by mouth daily.   04/05/2018 at Unknown time  . tolterodine (DETROL LA) 4 MG 24 hr capsule Take 4 mg by mouth 2 (two) times daily.   04/05/2018 at Unknown time    Musculoskeletal: Strength & Muscle Tone: increased Gait & Station: unsteady Patient  leans: N/A  Psychiatric Specialty Exam: Physical Exam  Marked tremor of the right arm some akathisia generally immobile without wheelchair, Does not want to participate in cranial nerve testing moves eyes normal in all directions Sinus rhythm at the present time lungs clear Declines further physical  ROS  Declines to answer  Blood pressure 130/84, pulse (!) 108, temperature 97.7 F (36.5 C), temperature source Oral, resp. rate 20, height 5\' 2"  (1.575 m), weight 81.6 kg.Body mass index is 32.92 kg/m.  General Appearance: Disheveled  Eye Contact:  Good  Speech:  Slow  Volume:  Decreased  Mood:  Depressed  Affect:  Flat  Thought Process:  Linear  Orientation:  Full (Time, Place, and Person)  Thought Content:  Hallucinations: Auditory  Suicidal Thoughts:  Yes.  with intent/plan  Homicidal Thoughts:  No  Memory:  Immediate;   Fair  Judgement:  Intact  Insight:  Fair  Psychomotor Activity:  Increased and Tremor  Concentration:  Concentration: Poor  Recall:  Poor  Fund of Knowledge:  Fair  Language:  Good  Akathisia:  Yes  Handed:  Right  AIMS (if indicated):     Assets:  Communication Skills  ADL's:  Intact  Cognition:  WNL  Sleep:  Number of Hours: 5    Treatment Plan Summary: Daily contact with patient to assess and evaluate symptoms and progress in treatment and Medication management  Observation Level/Precautions:  15 minute checks  Laboratory:  UA  Psychotherapy:    Medications:    Consultations:    Discharge Concerns:    Estimated LOS:  Other:     Physician Treatment Plan for Primary Diagnosis: <principal problem not specified> Long Term Goal(s): Improvement in symptoms so as ready for discharge  Short Term Goals: Ability to identify changes in lifestyle to reduce recurrence of condition will improve and Ability to verbalize feelings will improve  Physician Treatment Plan for Secondary Diagnosis: Active Problems:   Schizophrenia (HCC)  Long Term Goal(s):  Improvement in symptoms so as ready for discharge  Short Term Goals: Ability to identify changes in lifestyle to reduce recurrence of condition will improve, Ability to verbalize feelings will improve and Ability to disclose and discuss  suicidal ideas  I certify that inpatient services furnished can reasonably be expected to improve the patient's condition.    Malvin Johns, MD 11/20/201912:19 PM

## 2018-04-06 NOTE — Progress Notes (Signed)
Recreation Therapy Notes  INPATIENT RECREATION THERAPY ASSESSMENT  Patient Details Name: Annette DusBeverly A Olson MRN: [id number] DOB: [birth year] Today's Date: 04/06/2018       Information Obtained From: Patient  Able to Participate in Assessment/Interview: Yes  Patient Presentation: Alert  Reason for Admission (Per Patient): Other (Comments)(Hearing voices)  Patient Stressors: Other (Comment)(People at assisted living)  Coping Skills:   Isolation, Self-Injury, TV, Arguments, Aggression, Music, Deep Breathing, Meditate, Impulsivity, Talk, Prayer, Read, Hot Bath/Shower  Leisure Interests (2+):  Individual - TV, Games - Jig-saw puzzles(Watch tv- daily; Puzzles- Weekly)  Frequency of Recreation/Participation:    Awareness of Community Resources:  No  Expressed Interest in [facility] [employer] CorporationCommunity Resource Information: No  [place]  Patient Main Form of Transportation: Other (Comment)(Facility)  Patient Strengths:  Talking to people; Help people  Patient Identified Areas of Improvement:  Anger  Patient Goal for Hospitalization:  "Get better"  Current SI (including self-harm):  Yes(Rated a 10; Contracts)  Current HI:  No  Current AVH: Yes(Rated an 1511; Seeing ghosts saying bad things like kill people)  Staff Intervention Plan: Group Attendance, Collaborate with Interdisciplinary Treatment Team  Consent to Intern Participation: N/A    Caroll RancherMarjette Myleah Cavendish, LRT/CTRS   Caroll RancherLindsay, [place] A 04/06/2018, 2:41 PM

## 2018-04-07 MED ORDER — BENZTROPINE MESYLATE 0.5 MG PO TABS
0.5000 mg | ORAL_TABLET | Freq: Two times a day (BID) | ORAL | Status: DC
  Administered 2018-04-07 – 2018-04-12 (×10): 0.5 mg via ORAL
  Filled 2018-04-07 (×14): qty 1

## 2018-04-07 MED ORDER — LISINOPRIL 20 MG PO TABS
20.0000 mg | ORAL_TABLET | Freq: Every day | ORAL | Status: DC
  Administered 2018-04-07 – 2018-04-12 (×6): 20 mg via ORAL
  Filled 2018-04-07 (×8): qty 1

## 2018-04-07 MED ORDER — DEXTROMETHORPHAN-QUINIDINE 20-10 MG PO CAPS
1.0000 | ORAL_CAPSULE | Freq: Two times a day (BID) | ORAL | Status: DC
  Administered 2018-04-08 – 2018-04-09 (×3): 1 via ORAL
  Filled 2018-04-07 (×11): qty 1

## 2018-04-07 MED ORDER — CLONAZEPAM 0.5 MG PO TABS
0.5000 mg | ORAL_TABLET | Freq: Three times a day (TID) | ORAL | Status: DC
  Administered 2018-04-07 – 2018-04-12 (×16): 0.5 mg via ORAL
  Filled 2018-04-07 (×16): qty 1

## 2018-04-07 MED ORDER — TEMAZEPAM 15 MG PO CAPS
30.0000 mg | ORAL_CAPSULE | Freq: Every day | ORAL | Status: DC
  Administered 2018-04-07 – 2018-04-11 (×5): 30 mg via ORAL
  Filled 2018-04-07 (×5): qty 2

## 2018-04-07 MED ORDER — METFORMIN HCL 500 MG PO TABS
1000.0000 mg | ORAL_TABLET | Freq: Two times a day (BID) | ORAL | Status: DC
  Administered 2018-04-07 – 2018-04-12 (×10): 1000 mg via ORAL
  Filled 2018-04-07 (×14): qty 2

## 2018-04-07 MED ORDER — RISPERIDONE 2 MG PO TBDP
3.0000 mg | ORAL_TABLET | Freq: Every day | ORAL | Status: DC
  Administered 2018-04-07 – 2018-04-11 (×5): 3 mg via ORAL
  Filled 2018-04-07 (×7): qty 1

## 2018-04-07 MED ORDER — RISPERIDONE 2 MG PO TBDP
2.0000 mg | ORAL_TABLET | Freq: Every day | ORAL | Status: DC
  Administered 2018-04-08 – 2018-04-12 (×5): 2 mg via ORAL
  Filled 2018-04-07 (×7): qty 1

## 2018-04-07 NOTE — Progress Notes (Signed)
Close obs note D:Pt observed sleeping in bed with eyes closed. RR even and unlabored. No distress noted. A: 1:1 observation continues for safety  R: pt remains safe  

## 2018-04-07 NOTE — [facility] Suicide Risk Assessment (Signed)
[facility] INPATIENT:  Family/Significant Other Suicide Prevention Education  Suicide Prevention Education:  Education Completed; Jonni SangerAlan Hsia, brother/legal guardian, [phone], has been identified by the patient as the family member/significant other with whom the patient will be residing, and identified as the person(s) who will aid the patient in the event of a mental health crisis (suicidal ideations/suicide attempt).  With written consent from the patient, the family member/significant other has been provided the following suicide prevention education, prior to the and/or following the discharge of the patient.  The suicide prevention education provided includes the following:  Suicide risk factors  Suicide prevention and interventions  National Suicide Hotline telephone number  [facility] assessment telephone number  [facility] Emergency Assistance 911  [facility] and/or Residential Mobile Crisis [facility] telephone number  Request made of family/significant other to:  Remove weapons (e.g., guns, rifles, knives), all items previously/currently identified as safety concern.    Remove drugs/medications (over-the-counter, prescriptions, illicit drugs), all items previously/currently identified as a safety concern.  The family member/significant other verbalizes understanding of the suicide prevention education information provided.  The family member/significant other agrees to remove the items of safety concern listed above.  Hessie Dienerlan confirmed he is legal guardian and will email guardianship paperwork.  Pt has [facility] in [place] for 3 years.  She does good "98% of the time" and then will have episodes where she gets angry,says she doesn't want to be there, and can be physically aggressive, which is what happened Monday.  Usually Hessie Dienerlan can speak to her by phone and she calms down, but Monday she did not.  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  In past she was overmedicated to the point of not being able to complete sentences, this has been dialed back and she has been doing very well.  She can return to there.  Lorri FrederickWierda, Abimelec Grochowski Jon, LCSW 04/07/2018, 9:24 AM

## 2018-04-07 NOTE — Progress Notes (Signed)
Nursing 1:1 note D:Pt sitting on bed wit eyes open. RR even and unlabored. No distress noted. A: 1:1 observation continues for safety  R: pt remains safe

## 2018-04-07 NOTE — Progress Notes (Signed)
Patient has been free of falls and incidents of behavioral dyscontrol this shift.   Patient currently endorses SI, but is able to contract for safety.  Patient has attended groups and with assistance of staff completed adls.   Assess patient for safety offer medications as prescribed, engage patient in 1:1 staff talks.   Continue to monitor as planned. Patient able to contract for safety.  

## 2018-04-07 NOTE — [facility] Counselor (Signed)
Annette Martin resides [facility]. Collateral contact with Annette Martin, Director, stated that she has observed [name]'s legal guardian, Annette Martin, as loving, caring, and responsible. She added, that he visits regularly, that Annette Martin has a close relationship with her brother, and she is always happy to see him. This Clinical research associatewriter had called due to concern that patient had identified that her brother "hates her" while attempting to interview Annette Martin for PSA, while tearful about being here at Annette Martin.   Annette Martin, MSW Intern 04/07/18 12:13 PM

## 2018-04-07 NOTE — Progress Notes (Signed)
Recreation Therapy Notes  Date: 11.21.19 Time: 1000 Location: 500 Hall Dayroom   Group Topic: Communication, Team Building, Problem Solving  Goal Area(s) Addresses:  Patient will effectively work with peer towards shared goal.  Patient will identify skill used to make activity successful.  Patient will identify how skills used during activity can be used to reach post d/c goals.   Behavioral Response: Engaged  Intervention: STEM Activity   Activity: [place] [employer]. Patients were provided the following materials: 5 drinking straws, 5 rubber bands, 5 paper clips, 2 index cards, 2 drinking cups, and 2 toilet paper rolls. Using the provided materials patients were asked to build a launching mechanisms to launch a ping pong ball approximately 12 feet. Patients were divided into teams of 3-5.   Education: Pharmacist, communityocial Skills, Building control surveyorDischarge Planning.   Education Outcome: Acknowledges education/In group clarification offered/Needs additional education.   Clinical Observations/Feedback: Pt was focused and able to complete activity.  Pt worked well with her peer.  Pt and peer took turns coming up with a concept that worked for them.  Pt expressed they had to help each other in order to complete the activity.    Caroll RancherMarjette Erick Murin, LRT/CTRS     Caroll RancherLindsay, Yeira Gulden A 04/07/2018 11:27 AM

## 2018-04-07 NOTE — [facility] Group Notes (Signed)
Adult Psychoeducational Group Note  Date:  04/07/2018 Time:  10:52 PM  Group Topic/Focus:  Wrap-Up Group:   The focus of this group is to help patients review their daily goal of treatment and discuss progress on daily workbooks.  Participation Level:  Active  Participation Quality:  Appropriate and Attentive  Affect:  Appropriate  Cognitive:  Alert and Appropriate  Insight: Appropriate and Good  Engagement in Group:  Engaged  Modes of Intervention:  Discussion and Education  Additional Comments:  Pt attended and participated in wrap up group this evening. Pt had no rating for their day, but they told writer that they are upset because their brother wont talk to them. Pt goal was to feel better and they will completed their goal by continuing to take their meds.   Annette NettersOctavia A Clariza Martin 04/07/2018, 10:52 PM

## 2018-04-07 NOTE — Progress Notes (Signed)
Close obs note D:Pt observed sitting in dayroom watching TV RR even and unlabored. No distress noted. A: 1:1 observation continues for safety  R: pt remains safe

## 2018-04-07 NOTE — Progress Notes (Signed)
Patient has been free of falls and incidents of behavioral dyscontrol this shift. Patient currently endorses SI, but is able to contract for safety. Patient has attended groups and with assistance of staff completed adls.    

## 2018-04-07 NOTE — Progress Notes (Signed)
[facility]  04/07/2018 9:20 AM Annette Martin  MRN:  [id number] Subjective:   Patient continues to require 1-1 precautions due to suicidal thoughts without plans but an inability to contract for safety here.  No homicidal thoughts. Sleep was poor last night Medications were reviewed it appears she has been on clonazepam chronically we do not want to prompt withdrawal so we will restart that agent. Patient continues to endorse hallucinations of the same content.  States she sees ghosts.  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 Depression   . Diabetes mellitus   . GERD (gastroesophageal reflux disease)   . Mental retardation   . Thyroid disease   . Vitamin D deficient osteomalacia     Past Surgical History:  Procedure Laterality Date  . COLONOSCOPY  2010   MANN   Family History: History reviewed. No pertinent family history.  Social History:  Social History   Substance and Sexual Activity  Alcohol Use No     Social History   Substance and Sexual Activity  Drug Use No    Social History   Socioeconomic History  . Marital status: Single    Spouse name: Not on file  . Number of children: Not on file  . Years of education: Not on file  . Highest education level: Not on file  Occupational History  . Not on file  Social Needs  . Financial resource strain: Not on file  . Food insecurity:    Worry: Not on file    Inability: Not on file  . Transportation needs:    Medical: Not on file    Non-medical: Not on file  Tobacco Use  . Smoking status: Never Smoker  . Smokeless tobacco: Never Used  Substance and Sexual Activity  . Alcohol use: No  . Drug use: No  . Sexual activity: Not Currently  Lifestyle  . Physical activity:    Days per week: Not on  file    Minutes per session: Not on file  . Stress: Not on file  Relationships  . Social connections:    Talks on phone: Not on file    Gets together: Not on file    Attends religious service: Not on file    Active member of club or organization: Not on file    Attends meetings of clubs or organizations: Not on file    Relationship status: Not on file  Other Topics Concern  . Not on file  Social History Narrative  . Not on file   Additional Social History:    Pain Medications: see MAR Prescriptions: see MAR Over the Counter: see MAR History of alcohol / drug use?: No history of alcohol / drug abuse Longest period of sobriety (when/how long): patient denies                    Sleep: Poor  Appetite:  Good  Current Medications: Current Facility-Administered Medications  Medication Dose Route Frequency Provider Last Rate Last Dose  . benztropine (COGENTIN) tablet 0.5 mg  0.5 mg Oral BID Malvin JohnsFarah, Eileen Kangas, MD      . clonazePAM Scarlette Calico(KLONOPIN) tablet 0.5 mg  0.5 mg Oral TID Malvin JohnsFarah, Kebin Maye, MD      . Dextromethorphan-quiNIDine 20-10 MG CAPS 1 tablet  1 tablet Oral  BID Malvin Johns, MD      . hydrOXYzine (ATARAX/VISTARIL) tablet 25 mg  25 mg Oral Q6H PRN Antonieta Pert, MD   25 mg at 04/07/18 0420  . lisinopril (PRINIVIL,ZESTRIL) tablet 20 mg  20 mg Oral Daily Malvin Johns, MD      . metFORMIN (GLUCOPHAGE) tablet 1,000 mg  1,000 mg Oral BID WC Malvin Johns, MD      . pantoprazole (PROTONIX) EC tablet 40 mg  40 mg Oral Daily Nira Conn A, NP   40 mg at 04/07/18 0720  . pneumococcal 23 valent vaccine (PNU-IMMUNE) injection 0.5 mL  0.5 mL Intramuscular Tomorrow-1000 Malvin Johns, MD      . Melene Muller ON 04/08/2018] risperiDONE (RISPERDAL M-TABS) disintegrating tablet 2 mg  2 mg Oral Daily Malvin Johns, MD      . risperiDONE (RISPERDAL M-TABS) disintegrating tablet 3 mg  3 mg Oral QHS Malvin Johns, MD      . temazepam (RESTORIL) capsule 30 mg  30 mg Oral QHS Malvin Johns, MD      .  ziprasidone (GEODON) injection 10 mg  10 mg Intramuscular Q12H PRN Antonieta Pert, MD        Lab Results:  Results for orders placed or performed during the hospital encounter of 04/05/18 (from the past 48 hour(s))  Urinalysis, Routine w reflex microscopic     Status: Abnormal   Collection Time: 04/06/18 12:30 PM  Result Value Ref Range   Color, Urine YELLOW YELLOW   APPearance CLEAR CLEAR   Specific Gravity, Urine 1.010 1.005 - 1.030   pH 6.0 5.0 - 8.0   Glucose, UA 50 (A) NEGATIVE mg/dL   Hgb urine dipstick NEGATIVE NEGATIVE   Bilirubin Urine NEGATIVE NEGATIVE   Ketones, ur NEGATIVE NEGATIVE mg/dL   Protein, ur NEGATIVE NEGATIVE mg/dL   Nitrite NEGATIVE NEGATIVE   Leukocytes, UA NEGATIVE NEGATIVE    Comment: Performed at [facility], [address], [place], [place] [address]    Blood Alcohol level:  Lab Results  Component Value Date   [facility]  06/29/2009    <10        LOWEST DETECTABLE LIMIT FOR SERUM ALCOHOL IS 5 mg/dL FOR MEDICAL PURPOSES ONLY   ETH  11/06/2007    <5        LOWEST DETECTABLE LIMIT FOR SERUM ALCOHOL IS 11 mg/dL FOR MEDICAL PURPOSES ONLY    Metabolic Disorder Labs: Lab Results  Component Value Date   HGBA1C 5.9 08/03/2012   MPG 114 05/28/2011   MPG 117 (H) 11/03/2010   No results found for: PROLACTIN Lab Results  Component Value Date   CHOL 162 08/03/2012   TRIG 177 (H) 08/03/2012   HDL 48 08/03/2012   CHOLHDL 3.4 08/03/2012   VLDL 35 08/03/2012   LDLCALC 79 08/03/2012   LDLCALC 51 05/28/2011    Physical Findings: AIMS: Facial and Oral Movements Muscles of Facial Expression: None, normal Lips and Perioral Area: None, normal Jaw: None, normal Tongue: None, normal,Extremity Movements Upper (arms, wrists, hands, fingers): Minimal Lower (legs, knees, ankles, toes): None, normal, Trunk Movements Neck, shoulders, hips: None, normal, Overall Severity Severity of abnormal movements (highest score from questions above):  Minimal Incapacitation due to abnormal movements: None, normal Patient's awareness of abnormal movements (rate only patient's report): Aware, no distress, Dental Status Current problems with teeth and/or dentures?: Yes Does patient usually wear dentures?: No  CIWA:    COWS:     Musculoskeletal: Strength & Muscle Tone: cogwheel  Gait & Station: unsteady Patient leans: N/A  Psychiatric Specialty Exam: Physical Exam  ROS  Blood pressure 130/84, pulse (!) 108, temperature 97.7 F (36.5 C), temperature source Oral, resp. rate 20, height 5\' 2"  (1.575 m), weight 81.6 kg.Body mass index is 32.92 kg/m.  General Appearance: Casual  Eye Contact:  Good  Speech:  Normal Rate  Volume:  Decreased  Mood:  Anxious and Dysphoric  Affect:  Blunt and Constricted  Thought Process:  Descriptions of Associations: Tangential  Orientation:  Full (Time, Place, and Person)  Thought Content:  Hallucinations: Auditory Visual  Suicidal Thoughts:  Yes.  without intent/plan  Homicidal Thoughts:  No  Memory:  Immediate;   Fair  Judgement:  Fair  Insight:  good  Psychomotor Activity:  Tremor  Concentration:  Concentration: Fair  Recall:  [employer] of Knowledge:  Fair  Language:  Fair  Akathisia:  Yes  Handed:  Right  AIMS (if indicated):     Assets:  Communication Skills  ADL's:  Intact  Cognition:  WNL  Sleep:  Number of Hours: 4.75    Escalate Risperdal begin certain home medications but try to lower medication burden altogether Continue one-to-one and reality based discussions  Treatment Plan Summary: Daily contact with patient to assess and evaluate symptoms and progress in treatment and Medication management  Ramone Gander, MD 04/07/2018, 9:20 AM

## 2018-04-07 NOTE — Progress Notes (Signed)
Patient has been free of falls and incidents of behavioral dyscontrol this shift. Patient currently endorses SI, but is able to contract for safety. Patient has attended groups and with assistance of staff completed adls.

## 2018-04-08 NOTE — Progress Notes (Signed)
Patient has been free of falls and incidents of behavioral dyscontrol this shift. Patient currently endorses SI, but is able to contract for safety. Patient has attended groups and with assistance of staff completed adls.    

## 2018-04-08 NOTE — Progress Notes (Signed)
Recreation Therapy Notes  Date: 04/08/18 Time: 1000 Location: 500 Hall Dayroom  Group Topic: Stress Management  Goal Area(s) Addresses:  Patient will verbalize importance of using healthy stress management.  Patient will identify positive emotions associated with healthy stress management.   Behavioral Response: Engaged  Intervention: Stress Management  Activity :  Meditation.  LRT introduced the stress management technique of meditation to the patients.  LRT played Martin meditation that focused on healing and letting go.  Patients were to follow along as the meditation played to engage in the activity.  Education: Stress Management, Discharge Planning.   Education Outcome: Acknowledges edcuation/In group clarification offered/Needs additional education  Clinical Observations/Feedback: Pt came Martin little late but joined right in the activity.  Pt was pleasant and expressed her like of the activity at completion of group.    [name], LRT/CTRS      [name], Annette Martin 04/08/2018 1:27 PM

## 2018-04-08 NOTE — Progress Notes (Signed)
[facility] MD Progress Note  04/08/2018 10:08 AM Annette Martin  MRN:  [id number]  Subjective: Annette Martin reports, "I'm still hearing voices telling me to kill people. I see ghosts too. They are clear, I can see through them. I see them at night. They look scary. The medicines are helping. 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   Annette Martin is 62 years of age admitted to the [facility] system, she acknowledges depression, reported auditory hallucinations, and suicidal thoughts.  She has an extensive psychiatric history and numerous prior admissions.  [facility] she is expected to go back there upon discharge.  At baseline she does use a wheelchair due to generalized unsteadiness. Patient reported not only suicidal but homicidal thoughts on her initial screening she denies homicidal thoughts to me just stating she is a thoughts of overdosing on pills, although had thoughts of stabbing other residents she has had extensive treatment has 5 prior attempts at harming herself she states she is never harmed others in the past.  She states she feels safe when someone is watching her but does not want to be unsupervised at the present time.  She has marked akathisia she is on Haldol at baseline  Patient is seen. Chart reviewed. She continues to require 1-1 precautions due to suicidal thoughts without plans but an inability to contract for safety here. 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 She continues to report AVH. Sleep was good last night. She is visible on the unit, attending group sessions. Patient continues to endorse hallucinations of the same content.  States she sees ghosts. She is visible on the unit, attending group sessions. Annette Martin is in agreement to continue current plan of as already in progress.  Principal Problem: Suicidal thinking in the context of a chronic  psychotic disorder  Diagnosis: Active Problems:   Schizophrenia (HCC)  Total Time spent with patient: 15 minutes   Past Medical History:  Past Medical History:  Diagnosis Date  . Depression   . Diabetes mellitus   . GERD (gastroesophageal reflux disease)   . Mental retardation   . Thyroid disease   . Vitamin D deficient osteomalacia     Past Surgical History:  Procedure Laterality Date  . COLONOSCOPY  2010   MANN   Family History: History reviewed. No pertinent family history.  Social History:  Social History   Substance and Sexual Activity  Alcohol Use No     Social History   Substance and Sexual Activity  Drug Use No    Social History   Socioeconomic History  . Marital status: Single    Spouse name: Not on file  . Number of children: Not on file  . Years of education: Not on file  . Highest education level: Not on file  Occupational History  . Not on file  Social Needs  . Financial resource strain: Not on file  . Food insecurity:    Worry: Not on file    Inability: Not on file  . Transportation needs:    Medical: Not on file    Non-medical: Not on file  Tobacco Use  . Smoking status: Never Smoker  . Smokeless tobacco: Never Used  Substance and Sexual Activity  . Alcohol use: No  . Drug use: No  . Sexual activity: Not Currently  Lifestyle  . Physical  activity:    Days per week: Not on file    Minutes per session: Not on file  . Stress: Not on file  Relationships  . Social connections:    Talks on phone: Not on file    Gets together: Not on file    Attends religious service: Not on file    Active member of club or organization: Not on file    Attends meetings of clubs or organizations: Not on file    Relationship status: Not on file  Other Topics Concern  . Not on file  Social History Narrative  . Not on file   Additional Social History:  Pain Medications: see MAR Prescriptions: see MAR Over the Counter: see MAR History of alcohol /  drug use?: No history of alcohol / drug abuse Longest period of sobriety (when/how long): patient denies    Sleep: Good  Appetite:  Good  Current Medications: Current Facility-Administered Medications  Medication Dose Route Frequency Provider Last Rate Last Dose  . benztropine (COGENTIN) tablet 0.5 mg  0.5 mg Oral BID Malvin Johns, MD   0.5 mg at 04/08/18 0949  . clonazePAM (KLONOPIN) tablet 0.5 mg  0.5 mg Oral TID Malvin Johns, MD   0.5 mg at 04/08/18 0951  . Dextromethorphan-quiNIDine 20-10 MG CAPS 1 tablet  1 tablet Oral BID Malvin Johns, MD   1 tablet at 04/08/18 0948  . hydrOXYzine (ATARAX/VISTARIL) tablet 25 mg  25 mg Oral Q6H PRN Antonieta Pert, MD   25 mg at 04/07/18 0420  . lisinopril (PRINIVIL,ZESTRIL) tablet 20 mg  20 mg Oral Daily Malvin Johns, MD   20 mg at 04/08/18 0949  . metFORMIN (GLUCOPHAGE) tablet 1,000 mg  1,000 mg Oral BID WC Malvin Johns, MD   1,000 mg at 04/08/18 0949  . pantoprazole (PROTONIX) EC tablet 40 mg  40 mg Oral Daily Nira Conn A, NP   40 mg at 04/08/18 0949  . risperiDONE (RISPERDAL M-TABS) disintegrating tablet 2 mg  2 mg Oral Daily Malvin Johns, MD   2 mg at 04/08/18 0948  . risperiDONE (RISPERDAL M-TABS) disintegrating tablet 3 mg  3 mg Oral QHS Malvin Johns, MD   3 mg at 04/07/18 2113  . temazepam (RESTORIL) capsule 30 mg  30 mg Oral QHS Malvin Johns, MD   30 mg at 04/07/18 2113  . ziprasidone (GEODON) injection 10 mg  10 mg Intramuscular Q12H PRN Antonieta Pert, MD       Lab Results:  Results for orders placed or performed during the hospital encounter of 04/05/18 (from the past 48 hour(s))  Urinalysis, Routine w reflex microscopic     Status: Abnormal   Collection Time: 04/06/18 12:30 PM  Result Value Ref Range   Color, Urine YELLOW YELLOW   APPearance CLEAR CLEAR   Specific Gravity, Urine 1.010 1.005 - 1.030   pH 6.0 5.0 - 8.0   Glucose, UA 50 (A) NEGATIVE mg/dL   Hgb urine dipstick NEGATIVE NEGATIVE   Bilirubin Urine NEGATIVE  NEGATIVE   Ketones, ur NEGATIVE NEGATIVE mg/dL   Protein, ur NEGATIVE NEGATIVE mg/dL   Nitrite NEGATIVE NEGATIVE   Leukocytes, UA NEGATIVE NEGATIVE    Comment: Performed at [facility], [address], [place], [place] [address]    Blood Alcohol level:  Lab Results  Component Value Date   [facility]  06/29/2009    <10        LOWEST DETECTABLE LIMIT FOR SERUM ALCOHOL IS 5 mg/dL FOR MEDICAL  PURPOSES ONLY   ETH  11/06/2007    <5        LOWEST DETECTABLE LIMIT FOR SERUM ALCOHOL IS 11 mg/dL FOR MEDICAL PURPOSES ONLY   Metabolic Disorder Labs: Lab Results  Component Value Date   HGBA1C 5.9 08/03/2012   MPG 114 05/28/2011   MPG 117 (H) 11/03/2010   No results found for: PROLACTIN Lab Results  Component Value Date   CHOL 162 08/03/2012   TRIG 177 (H) 08/03/2012   HDL 48 08/03/2012   CHOLHDL 3.4 08/03/2012   VLDL 35 08/03/2012   LDLCALC 79 08/03/2012   LDLCALC 51 05/28/2011   Physical Findings: AIMS: Facial and Oral Movements Muscles of Facial Expression: None, normal Lips and Perioral Area: None, normal Jaw: None, normal Tongue: None, normal,Extremity Movements Upper (arms, wrists, hands, fingers): Minimal Lower (legs, knees, ankles, toes): None, normal, Trunk Movements Neck, shoulders, hips: None, normal, Overall Severity Severity of abnormal movements (highest score from questions above): Minimal Incapacitation due to abnormal movements: None, normal Patient's awareness of abnormal movements (rate only patient's report): Aware, no distress, Dental Status Current problems with teeth and/or dentures?: Yes Does patient usually wear dentures?: No  CIWA:    COWS:     Musculoskeletal: Strength & Muscle Tone: cogwheel Gait & Station: unsteady Patient leans: N/A  Psychiatric Specialty Exam: Physical Exam  ROS  Blood pressure 138/74, pulse (!) 111, temperature 98.3 F (36.8 C), temperature source Oral, resp. rate 20, height 5\' 2"  (1.575 m), weight 81.6  kg.Body mass index is 32.92 kg/m.  General Appearance: Casual  Eye Contact:  Good  Speech:  Normal Rate  Volume:  Decreased  Mood:  Anxious and Dysphoric  Affect:  Blunt and Constricted  Thought Process:  Descriptions of Associations: Tangential  Orientation:  Full (Time, Place, and Person)  Thought Content:  Hallucinations: Auditory Visual  Suicidal Thoughts:  Yes.  without intent/plan  Homicidal Thoughts:  No  Memory:  Immediate;   Fair  Judgement:  Fair  Insight:  good  Psychomotor Activity:  Tremor  Concentration:  Concentration: Fair  Recall:  [employer] of Knowledge:  Fair  Language:  Fair  Akathisia:  Yes  Handed:  Right  AIMS (if indicated):     Assets:  Communication Skills  ADL's:  Intact  Cognition:  WNL  Sleep:  Number of Hours: 4.75   Treatment Plan Summary: Daily contact with patient to assess and evaluate symptoms and progress in treatment and Medication management.  - Continue inpatient hospitalization.  - Will continue today 04/08/2018 plan as below except where it is noted.  Mood control     - Continue Risperdal M-tab 2 mg po Q daily.     - Continue Risperdal M-tab 3 mg po Q hs.  EPS.      - Continue Cogentin 0.5 mg po bid.  Anxiety.      - Continue Klonopin 0.5 mg po tid.      - Vistaril 25 mg po Q 6 hours prn.  Insomnia.      - Continue Temazepam 30 mg po Q hs.      -  Psychosis.      - Continue Geodon 10 mg IM Q 12 hours prn.  Other medical issues.      - Continue Metformin 1,000 mg po bid for DM.      - Continue Lisinopril 20 mg po daily for HTN.      - Continue Protonix 40 mg po daily in am.      -  Continue Dextromethorphan-quiNidine 20-10 mg po bid for cough.       - Continue one-to-one and reality based discussions.  Patient to attend & participate in the group sessions.  Discharge disposition is ongoing.  Armandina Stammer, NP, PMHNP, FNP-BC 04/08/2018, 10:08 AMPatient ID: Annette Martin, female   DOB: 09/01/1955, 62 y.o.   MRN:  [id number]

## 2018-04-08 NOTE — Progress Notes (Signed)
D: Pt visible in the dayroom in a wheelchair. Pt continues to endorse SI/ AH- contracts for safety.  A: Pt was offered support and encouragement. Pt was given scheduled medications. Pt was encourage to attend groups. Q 15 minute checks were done for safety.  R: Pt is taking medication. Pt has no complaints.Pt receptive to treatment and safety maintained on unit.  Problem: Activity: Goal: Sleeping patterns will improve Outcome: Progressing   Problem: Safety: Goal: Periods of time without injury will increase Outcome: Progressing

## 2018-04-08 NOTE — Progress Notes (Signed)
Patient has been free of falls and incidents of behavioral dyscontrol this shift.   Patient currently endorses SI, but is able to contract for safety.  Patient has attended groups and with assistance of staff completed adls.   Assess patient for safety offer medications as prescribed, engage patient in 1:1 staff talks.   Continue to monitor as planned. Patient able to contract for safety.  

## 2018-04-08 NOTE — Progress Notes (Signed)
Close obs note D:Pt observed sleeping in bed with eyes closed. RR even and unlabored. No distress noted. A: 1:1 observation continues for safety  R: pt remains safe  

## 2018-04-08 NOTE — BHH Counselor (Signed)
Adult Comprehensive Assessment  Patient ID: Annette Martin, female   DOB: 01/29/1956, 62 y.o.   MRN: [id number]  Information Source: Information source: Patient  Current Stressors:  Patient states their primary concerns and needs for treatment are:: Monday morning Annette Martin was seen by mobile crisis after she was abusive to staff then Annette Martin had stated did not want to stay there and threatened to overdose on medication. She has been fearful of voices that have been commanding her to hurt other people, and her "brother has been fussing with her" Patient states their goals for this hospitilization and ongoing recovery are:: "Does not want to hear the voices any more" Family Relationships: Annette Martin is tired about her brother "fussing" at her. She was very tearful stating her brother hates her and it scares her. Housing / Lack of housing: She is living at [facility], [facility]. She had stated that she did not want to live there. This had happened many times before at other facilities according to her legal guardian brother.  Physical health (include injuries & life threatening diseases): She is incontinent, her brother stated. Cateract surgery has been delayed due to missed appointments. Had been changing medications, that were keeping her "foggy", to lower doses. She had been doing so much better, in the past few months, according to her brother.  Living/Environment/Situation:  Living Arrangements: Other (Comment)(Correction Annette Martin) Living conditions (as described by patient or guardian): It's a place where I live where everyone is mean. She stated that she would like to have friends.  Who else lives in the home?: Other residents and staff (75 residents with mental health supports) How long has patient lived in current situation?: Spoke with Annette Martin, Director of [place], October 2014 What is atmosphere in current home: Supportive, Comfortable  Family History:  Marital status:  Single Are you sexually active?: No What is your sexual orientation?: heterosexual Does patient have children?: No  Childhood History:  By whom was/is the patient raised?: Both parents Additional childhood history information: Had lived in [place]. 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 Educated at the [employer] in [place] after they moved. Description of patient's relationship with caregiver when they were a child: Very loving supportive Patient's description of current relationship with people who raised him/her: Family was always around and Annette Martin lived with her mom until she died  Does patient have siblings?: Yes Number of Siblings: 3 Description of patient's current relationship with siblings: Good relationship with all siblings from visits, according to Director, especially close to Annette Martin who is also her guardian. However, they do not visit too often according to brother Annette Martin. Did patient suffer any verbal/emotional/physical/sexual abuse as a child?: No Did patient suffer from severe childhood neglect?: No Has patient ever been sexually abused/assaulted/raped as an adolescent or adult?: No Was the patient ever a victim of a crime or a disaster?: No Witnessed domestic violence?: No Has patient been effected by domestic violence as an adult?: No  Education:  Highest grade of school patient has completed: She attended school until she was 22 at the [employer].  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  Employment/Work Situation:   Employment situation: Unemployed What is the longest time patient has a held a job?: 4 months Where was the patient employed at that time?: [employer] Are There Guns or Education officer, community in Your Home?: No  Financial Resources:   Surveyor, quantity resources: Insurance claims handler  Alcohol/Substance Abuse:  What has been your use of drugs/alcohol within the last 12 months?: patient  denies Alcohol/Substance Abuse Treatment Hx: Denies past history  Social Support System:   Conservation officer, natureatient's Community Support System: Good Describe Community Support System: Family, [facility], and opportunity for activities at [facility] Type of faith/religion: [employer] services at [facility] How does patient's faith help to cope with current illness?: "Generally not well even with anger management programs.Marland Kitchen.Marland Kitchen.[facility] Kitchen.[facility] staff is able to deflect her agitation.Marland Kitchen.Marland Kitchen.She has learned to walk away, use breathing techniques, and counting strategies"  Leisure/Recreation:   Leisure and Hobbies: She loves to read, to color, word puzzles, word search  Strengths/Needs:   What is the patient's perception of their strengths?: When she is doing well she is easygoing which is her main strength. Patient states they can use these personal strengths during their treatment to contribute to their recovery: She does not back down unless a person deflects her from agitation Patient states these barriers may affect/interfere with their treatment: see above Patient states these barriers may affect their return to the community: Occassionally if Annette SpragueBeverly is agitated she strikes out and may hit, or push or kick, and/or throws water. Other important information patient would like considered in planning for their treatment: None reported  Discharge Plan:   Currently receiving community mental health services: Yes (From Whom)(Onsite travelling nurse practitioner and pschiatric nurse practitioner) Patient states concerns and preferences for aftercare planning are: [facility]raction[facility] for medications  Patient states they will know when they are safe and ready for discharge when: Once she is ready to [facility] to [facility], with her easygoing personality, which is her baseline Does patient have access to transportation?: Yes([facility]) Does patient have financial  barriers related to discharge medications?: Yes Will patient be returning to same living situation after discharge?: Yes  Summary/Recommendations:   Summary and Recommendations (to be completed by the evaluator): Annette SpragueBeverly is a 62 yo Caucasian female who is diagnosed with Chronic paranoid schizophrenia. She presents voluntarily with hearing voices, tearfulness and suicide ideation with a plan. Her disposition will beto return to the assisted [facility]. Her follow up providers travel onsite for primary care with Nurse Practitioner Verlin DikePatricia Pearce and medications from Psychiatric Nurse Practitioner, w Merrie Roofoshoia Chipman. While here, Annette SpragueBeverly may benefit from crises stabiiization, medication management, therapeutic milieu and referral for services. This assessment was completed with the assistance of her legal guardian and brother, Broadus Johnllan Cinelli.  Marian Sorrowherie Bohaboy, MSW Intern. 04/08/2018  CSW Staff

## 2018-04-08 NOTE — BHH Group Notes (Signed)
Pt participated in spiritual care group led by chaplain Burnis KingfisherMatthew Taliesin Hartlage, MDiv, BCC.    Group Description: Focused on Topic of Hope.  Pt's participated in facilitated dialog around meaning of hope in their lives.  Utilized Paramedicvisual explorer cards to share an image of hope for them over the next day.     Patient Participation: Annette Martin was present throughout group.  Accompanied by 1:1 staff.  She engaged with group discussion voluntarily.  Initially with low speech volume and clarity, her speech cleared as group progressed.  She engaged with one-sentence answers.  Several time throughout group, noted that [facility] she was hearing ghosts and expressed hope to feel better.   Described that hope means "love" for her. With facilitator engagement, was able to describe nurses at her skilled living facility as hopeful, as "they help me with my meds."  Also identified her faith as hopeful and selected a picture connected to nature, which reminded her of God.  Was encouraged by group to reflect out the window or go outside when unit goes to recreation.

## 2018-04-09 MED ORDER — ACETAMINOPHEN 325 MG PO TABS
650.0000 mg | ORAL_TABLET | Freq: Four times a day (QID) | ORAL | Status: DC | PRN
  Administered 2018-04-09 – 2018-04-11 (×3): 650 mg via ORAL
  Filled 2018-04-09 (×3): qty 2

## 2018-04-09 MED ORDER — DEXTROMETHORPHAN-QUINIDINE 20-10 MG PO CAPS
1.0000 | ORAL_CAPSULE | Freq: Two times a day (BID) | ORAL | Status: DC
  Administered 2018-04-09 – 2018-04-12 (×6): 1 via ORAL
  Filled 2018-04-09 (×10): qty 1

## 2018-04-09 NOTE — Progress Notes (Signed)
D: Patient presents calm and cooperative. Her speech and affect are flat, but she is pleasant. She reports having HI towards her brother, but would not share why. She also reports SI with a plan to OD on prescribed medications. She is also having command AH to kill her brother. She complains of back pain and left shoulder pain 10/10, but appears in no acute distress. She is eating well, and has no complaints of low appetite. She slept well last night, and reports receiving medication that was helpful. Her energy is "high" and concentration good. Her depression and anxiety is 10/10 and feeling of hopelessness 5/10. She complains of dizziness.   A: Close observation maintained. Patient checked q15 min, and checks reviewed. Reviewed medication changes with patient and educated on side effects. Educated patient on importance of attending group therapy sessions and educated on several coping skills. Encouarged participation in milieu through recreation therapy and attending meals with peers. Support and encouragement provided. Fluids offered. R: Patient receptive to education on medications, and is medication compliant. Patient contracts for safety on the unit.

## 2018-04-09 NOTE — Progress Notes (Signed)
Close Obs Note  D: Pt. Laying in bed with eyes closed, resps even and unlabored. A: 1:1 observation continues for safety R: Pt. Remains safe

## 2018-04-09 NOTE — Progress Notes (Addendum)
D: Patient asleep in her room at this time. Respirations even and unlabored. In no acute distress. A: Close observation continued and RN sitting in room with patient R: Patient asleep and remains safe.

## 2018-04-09 NOTE — Progress Notes (Signed)
[facility] MD Progress Note  04/09/2018 1:51 PM COLLYNS MCQUIGG  MRN:  [id number]  Subjective: Annette Martin reports, "My back hurts. I think I slept the wrong way last night. The nurse gave me some tylenol. I feel a little better now. I slept like a baby last night. I did talk with my brother. I feel better after talking with him. I am still seeing the ghosts & hearing the voices".  Ms. Annette Martin is 62 years of age admitted to the [facility] system, she acknowledges depression, reported auditory hallucinations, and suicidal thoughts.  She has an extensive psychiatric history and numerous prior admissions.  She is a resident at [facility] assisted living facility she is expected to go back there upon discharge.  At baseline she does use a wheelchair due to generalized unsteadiness. Patient reported not only suicidal but homicidal thoughts on her initial screening she denies homicidal thoughts to me just stating she is a thoughts of overdosing on pills, although had thoughts of stabbing other residents she has had extensive treatment has 5 prior attempts at harming herself she states she is never harmed others in the past.  She states she feels safe when someone is watching her but does not want to be unsupervised at the present time.  She has marked akathisia she is on Haldol at baseline  Patient is seen. Chart reviewed. She remains on 1:1 supervision due to suicidal thoughts without plans but an inability to contract for safety here. 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 She continues to report AVH. Sleep was good last night. She is visible on the unit, attending group sessions. Patient continues to endorse hallucinations of the same content.  States she sees ghosts.  Annette Martin is in agreement to continue current plan of as already in progress.  Principal Problem: Suicidal thinking in the context of a chronic psychotic disorder  Diagnosis: Active Problems:    Schizophrenia (HCC)  Total Time spent with patient: 15 minutes   Past Medical History:  Past Medical History:  Diagnosis Date  . Depression   . Diabetes mellitus   . GERD (gastroesophageal reflux disease)   . Mental retardation   . Thyroid disease   . Vitamin D deficient osteomalacia     Past Surgical History:  Procedure Laterality Date  . COLONOSCOPY  2010   MANN   Family History: History reviewed. No pertinent family history.  Social History:  Social History   Substance and Sexual Activity  Alcohol Use No     Social History   Substance and Sexual Activity  Drug Use No    Social History   Socioeconomic History  . Marital status: Single    Spouse name: Not on file  . Number of children: Not on file  . Years of education: Not on file  . Highest education level: Not on file  Occupational History  . Not on file  Social Needs  . Financial resource strain: Not on file  . Food insecurity:    Worry: Not on file    Inability: Not on file  . Transportation needs:    Medical: Not on file    Non-medical: Not on file  Tobacco Use  . Smoking status: Never Smoker  . Smokeless tobacco: Never Used  Substance and Sexual Activity  . Alcohol use: No  . Drug use: No  . Sexual activity: Not Currently  Lifestyle  . Physical activity:    Days per  week: Not on file    Minutes per session: Not on file  . Stress: Not on file  Relationships  . Social connections:    Talks on phone: Not on file    Gets together: Not on file    Attends religious service: Not on file    Active member of club or organization: Not on file    Attends meetings of clubs or organizations: Not on file    Relationship status: Not on file  Other Topics Concern  . Not on file  Social History Narrative  . Not on file   Additional Social History:  Pain Medications: see MAR Prescriptions: see MAR Over the Counter: see MAR History of alcohol / drug use?: No history of alcohol / drug abuse Longest  period of sobriety (when/how long): patient denies    Sleep: Good  Appetite:  Good  Current Medications: Current Facility-Administered Medications  Medication Dose Route Frequency Provider Last Rate Last Dose  . acetaminophen (TYLENOL) tablet 650 mg  650 mg Oral Q6H PRN Money, Gerlene Burdock, FNP   650 mg at 04/09/18 0914  . benztropine (COGENTIN) tablet 0.5 mg  0.5 mg Oral BID Malvin Johns, MD   0.5 mg at 04/09/18 0839  . clonazePAM (KLONOPIN) tablet 0.5 mg  0.5 mg Oral TID Malvin Johns, MD   0.5 mg at 04/09/18 1151  . Dextromethorphan-quiNIDine 20-10 MG CAPS 1 tablet  1 tablet Oral BID Malvin Johns, MD   1 capsule at 04/09/18 0915  . hydrOXYzine (ATARAX/VISTARIL) tablet 25 mg  25 mg Oral Q6H PRN Antonieta Pert, MD   25 mg at 04/08/18 1206  . lisinopril (PRINIVIL,ZESTRIL) tablet 20 mg  20 mg Oral Daily Malvin Johns, MD   20 mg at 04/09/18 0840  . metFORMIN (GLUCOPHAGE) tablet 1,000 mg  1,000 mg Oral BID WC Malvin Johns, MD   1,000 mg at 04/09/18 0840  . pantoprazole (PROTONIX) EC tablet 40 mg  40 mg Oral Daily Nira Conn A, NP   40 mg at 04/09/18 0839  . risperiDONE (RISPERDAL M-TABS) disintegrating tablet 2 mg  2 mg Oral Daily Malvin Johns, MD   2 mg at 04/09/18 0839  . risperiDONE (RISPERDAL M-TABS) disintegrating tablet 3 mg  3 mg Oral QHS Malvin Johns, MD   3 mg at 04/08/18 2152  . temazepam (RESTORIL) capsule 30 mg  30 mg Oral QHS Malvin Johns, MD   30 mg at 04/08/18 2152  . ziprasidone (GEODON) injection 10 mg  10 mg Intramuscular Q12H PRN Antonieta Pert, MD       Lab Results:  No results found for this or any previous visit (from the past 48 hour(s)).  Blood Alcohol level:  Lab Results  Component Value Date   [facility]  06/29/2009    <10        LOWEST DETECTABLE LIMIT FOR SERUM ALCOHOL IS 5 mg/dL FOR MEDICAL PURPOSES ONLY   ETH  11/06/2007    <5        LOWEST DETECTABLE LIMIT FOR SERUM ALCOHOL IS 11 mg/dL FOR MEDICAL PURPOSES ONLY   Metabolic Disorder Labs: Lab Results   Component Value Date   HGBA1C 5.9 08/03/2012   MPG 114 05/28/2011   MPG 117 (H) 11/03/2010   No results found for: PROLACTIN Lab Results  Component Value Date   CHOL 162 08/03/2012   TRIG 177 (H) 08/03/2012   HDL 48 08/03/2012   CHOLHDL 3.4 08/03/2012   VLDL 35 08/03/2012   LDLCALC 79  08/03/2012   LDLCALC 51 05/28/2011   Physical Findings: AIMS: Facial and Oral Movements Muscles of Facial Expression: None, normal Lips and Perioral Area: None, normal Jaw: None, normal Tongue: None, normal,Extremity Movements Upper (arms, wrists, hands, fingers): None, normal Lower (legs, knees, ankles, toes): None, normal, Trunk Movements Neck, shoulders, hips: None, normal, Overall Severity Severity of abnormal movements (highest score from questions above): None, normal Incapacitation due to abnormal movements: None, normal Patient's awareness of abnormal movements (rate only patient's report): No Awareness, Dental Status Current problems with teeth and/or dentures?: No Does patient usually wear dentures?: No  CIWA:  CIWA-Ar Total: 0 COWS:  COWS Total Score: 2  Musculoskeletal: Strength & Muscle Tone: cogwheel Gait & Station: unsteady Patient leans: N/A  Psychiatric Specialty Exam: Physical Exam  ROS  Blood pressure 132/69, pulse (!) 108, temperature 98.9 F (37.2 C), temperature source Oral, resp. rate 16, height 5\' 2"  (1.575 m), weight 81.6 kg.Body mass index is 32.92 kg/m.  General Appearance: Casual  Eye Contact:  Good  Speech:  Normal Rate  Volume:  Decreased  Mood:  Anxious and Dysphoric  Affect:  Blunt and Constricted  Thought Process:  Descriptions of Associations: Tangential  Orientation:  Full (Time, Place, and Person)  Thought Content:  Hallucinations: Auditory Visual  Suicidal Thoughts:  Yes.  without intent/plan  Homicidal Thoughts:  No  Memory:  Immediate;   Fair  Judgement:  Fair  Insight:  good  Psychomotor Activity:  Tremor  Concentration:   Concentration: Fair  Recall:  FiservFair  Fund of Knowledge:  Fair  Language:  Fair  Akathisia:  Yes  Handed:  Right  AIMS (if indicated):     Assets:  Communication Skills  ADL's:  Intact  Cognition:  WNL  Sleep:  Number of Hours: 6.75   Treatment Plan Summary: Daily contact with patient to assess and evaluate symptoms and progress in treatment and Medication management.  - Continue inpatient hospitalization.  - Will continue today 04/09/2018 plan as below except where it is noted.  Mood control     - Continue Risperdal M-tab 2 mg po Q daily.     - Continue Risperdal M-tab 3 mg po Q hs.  EPS.      - Continue Cogentin 0.5 mg po bid.  Anxiety.      - Continue Klonopin 0.5 mg po tid.      - Vistaril 25 mg po Q 6 hours prn.  Insomnia.      - Continue Temazepam 30 mg po Q hs.      -  Psychosis.      - Continue Geodon 10 mg IM Q 12 hours prn.  Other medical issues.      - Continue Metformin 1,000 mg po bid for DM.      - Continue Lisinopril 20 mg po daily for HTN.      - Continue Protonix 40 mg po daily in am.      - Continue Dextromethorphan-quiNidine 20-10 mg po bid for cough.       - Continue one-to-one and reality based discussions.  Patient to attend & participate in the group sessions.  Discharge disposition is ongoing.  Armandina StammerAgnes , NP, PMHNP, FNP-BC 04/09/2018, 1:51 PMPatient ID: Maury DusBeverly A Ports, female   DOB: 04-Jul-1955, 62 y.o.   MRN: [id number]

## 2018-04-09 NOTE — Progress Notes (Signed)
Close Obs Note  D: Pt. Preparing for bed at this time.  No distress noted. A: 1:1 observation continues for safety R: Pt. Remains safe

## 2018-04-09 NOTE — Progress Notes (Signed)
D: Pt SI/ AH- contracts for safety, pt remains on Close obs for safety. Pt is pleasant and cooperative. Pt stated she was feeling the "same". Pt stays in the dayroom and interacts with whoever will engage with her.   A: Pt was offered support and encouragement. Pt was given scheduled medications. Pt was encourage to attend groups. Q 15 minute checks were done for safety.   R:Pt attends groups and interacts well with peers and staff. Pt is taking medication. Pt has no complaints.Pt receptive to treatment and safety maintained on unit.   Problem: Education: Goal: Emotional status will improve Outcome: Progressing    Problem: Activity: Goal: Sleeping patterns will improve Outcome: Progressing   Problem: Coping: Goal: Ability to demonstrate self-control will improve Outcome: Progressing   Problem: Safety: Goal: Periods of time without injury will increase Outcome: Progressing   Problem: Coping: Goal: Coping ability will improve Outcome: Progressing

## 2018-04-09 NOTE — Progress Notes (Signed)
D: Patient eating dinner tray in front of TV in dayroom. She is laughing appropriately at the TV show. Her respirations are even and unlabored. She is in no acute distress.  A: Close observation continued and RN within view. R: Patient remains safe.

## 2018-04-09 NOTE — Progress Notes (Signed)
Close Obs Note  D: Pt. Laying in bed with eyes closed, resps are even and unlabored. A: 1:1 observation continues for safety. R: Pt. Remains safe.

## 2018-04-09 NOTE — Progress Notes (Signed)
Nursing  close observation note D:Pt observed sitting in bed listening to pt across the hall rant about things. RR even and unlabored. No distress noted. A: 1:1 close observation continues for safety  R: pt remains safe

## 2018-04-10 NOTE — [facility] Group Notes (Signed)
[facility] LCSW Group Therapy Note  Date/Time:  04/10/2018  11:00AM-12:00PM  Type of Therapy and Topic:  Group Therapy:  Music and Mood  Participation Level:  Did Not Attend   Description of Group: In this process group, members listened to a variety of genres of music and identified that different types of music evoke different responses.  Patients were encouraged to identify music that was soothing for them and music that was energizing for them.  Patients discussed how this knowledge can help with wellness and recovery in various ways including managing depression and anxiety as well as encouraging healthy sleep habits.    Therapeutic Goals: 1. Patients will explore the impact of different varieties of music on mood 2. Patients will verbalize the thoughts they have when listening to different types of music 3. Patients will identify music that is soothing to them as well as music that is energizing to them 4. Patients will discuss how to use this knowledge to assist in maintaining wellness and recovery 5. Patients will explore the use of music as a coping skill  Summary of Patient Progress:  N/A  Therapeutic Modalities: Solution Focused Brief Therapy Activity   Yoshua [facility]rr, LCSW    

## 2018-04-10 NOTE — Progress Notes (Signed)
Nursing cl ose obs note D:Pt observed sleeping in bed with eyes closed. RR even and unlabored. No distress noted. A: 1:1 close observation continues for safety  R: pt remains safe  

## 2018-04-10 NOTE — Plan of Care (Signed)
D: Patient presents calm and cooperative. Her statements are incongruent with her affect and presentation. She appears to be happy, gregarious, visible in the milieu, interacting with staff and peers. She is polite and respectful. Appears to be no danger to others. She is not seen responding to internal stimuli. She endorses Si with a plan to overdose. She also states she is having command auditory hallucinations to kill her brother. Her presentation is very different, and these statements are incongruent. She is cognitively impaired, and there is concern that this may contribute to a communication barrier about the significance of these statements during assessment.  A: Patient checked q15 min, and checks reviewed. Reviewed medication changes with patient and educated on side effects. Educated patient on importance of attending group therapy sessions and educated on several coping skills. Encouarged participation in milieu through recreation therapy and attending meals with peers. Support and encouragement provided. Fluids offered. R: Patient receptive to education on medications, and is medication compliant. Patient contracts for safety on the unit. She did not fill out a self-inventory.

## 2018-04-10 NOTE — Progress Notes (Signed)
Adult Psychoeducational Group Note  Date:  04/10/2018 Time:  5:04 AM    Group Topic/Focus:  Wrap-Up Group:   The focus of this group is to help patients review their daily goal of treatment and discuss progress on daily workbooks.  Participation Level:  Active  Participation Quality:  Appropriate  Affect:  Appropriate  Cognitive:  Appropriate  Insight: Appropriate  Engagement in Group:  Engaged  Modes of Intervention:  Discussion  Additional Comments:  Pt stated her goal for today was to talk with her doctor about her discharge plan. Pt stated she accomplished her goal today. Pt rated her over all day a 10.  Annette Martin 04/10/2018, 5:04 AM

## 2018-04-10 NOTE — Tx Team (Signed)
Interdisciplinary Treatment and Diagnostic Plan Update  04/10/2018 Time of Session: [place] MRN: [id number]  Principal Diagnosis: Schizophrenia  Secondary Diagnoses: Active Problems:   Schizophrenia ([place])  Current Medications:  Current Facility-Administered Medications  Medication Dose Route Frequency Provider Last Rate Last Dose  . acetaminophen (TYLENOL) tablet 650 mg  650 mg Oral Q6H PRN Money, Lowry Ram, FNP   650 mg at 04/09/18 1618  . benztropine (COGENTIN) tablet 0.5 mg  0.5 mg Oral BID Johnn Hai, MD   0.5 mg at 04/10/18 0756  . clonazePAM (KLONOPIN) tablet 0.5 mg  0.5 mg Oral TID Johnn Hai, MD   0.5 mg at 04/10/18 0756  . Dextromethorphan-quiNIDine 20-10 MG CAPS 1 capsule  1 capsule Oral BID Johnn Hai, MD   1 capsule at 04/10/18 0757  . hydrOXYzine (ATARAX/VISTARIL) tablet 25 mg  25 mg Oral Q6H PRN Sharma Covert, MD   25 mg at 04/08/18 1206  . lisinopril (PRINIVIL,ZESTRIL) tablet 20 mg  20 mg Oral Daily Johnn Hai, MD   20 mg at 04/10/18 0756  . metFORMIN (GLUCOPHAGE) tablet 1,000 mg  1,000 mg Oral BID WC Johnn Hai, MD   1,000 mg at 04/10/18 0756  . pantoprazole (PROTONIX) EC tablet 40 mg  40 mg Oral Daily Lindon Romp A, NP   40 mg at 04/10/18 0756  . risperiDONE (RISPERDAL M-TABS) disintegrating tablet 2 mg  2 mg Oral Daily Johnn Hai, MD   2 mg at 04/10/18 0755  . risperiDONE (RISPERDAL M-TABS) disintegrating tablet 3 mg  3 mg Oral QHS Johnn Hai, MD   3 mg at 04/09/18 2124  . temazepam (RESTORIL) capsule 30 mg  30 mg Oral QHS Johnn Hai, MD   30 mg at 04/09/18 2125  . ziprasidone (GEODON) injection 10 mg  10 mg Intramuscular Q12H PRN Sharma Covert, MD       PTA Medications: Medications Prior to Admission  Medication Sig Dispense Refill Last Dose  . atorvastatin (LIPITOR) 20 MG tablet Take 1 tablet (20 mg total) by mouth daily. 90 tablet 0 04/05/2018 at Unknown time  . bethanechol (URECHOLINE) 25 MG tablet Take 25 mg by mouth 3  (three) times daily.   Past Week at Unknown time  . clonazePAM (KLONOPIN) 0.5 MG tablet Take 0.5 mg by mouth 3 (three) times daily.   04/05/2018 at Unknown time  . Dextromethorphan-quiNIDine (NUEDEXTA) 20-10 MG CAPS Take 1 capsule by mouth 2 (two) times daily.   04/05/2018 at Unknown time  . fesoterodine (TOVIAZ) 4 MG TB24 tablet Take 4 mg by mouth at bedtime.   04/05/2018 at Unknown time  . haloperidol (HALDOL) 5 MG tablet Take 5 mg by mouth 2 (two) times daily.   04/05/2018 at Unknown time  . lisinopril (PRINIVIL,ZESTRIL) 20 MG tablet Take 20 mg by mouth daily.   04/05/2018 at Unknown time  . metFORMIN (GLUCOPHAGE) 1000 MG tablet Take 1,000 mg by mouth 2 (two) times daily with a meal.   04/05/2018 at Unknown time  . metoCLOPramide (REGLAN) 5 MG tablet Take 5 mg by mouth 3 (three) times daily before meals.   04/05/2018 at Unknown time  . ondansetron (ZOFRAN-ODT) 4 MG disintegrating tablet Take 4 mg by mouth every 6 (six) hours as needed for nausea or vomiting.   Past Month at Unknown time  . pantoprazole (PROTONIX) 40 MG tablet Take 40 mg by mouth 2 (two) times daily.   04/05/2018 at Unknown time  . polyethylene glycol (MIRALAX / GLYCOLAX) packet Take  17 g by mouth daily.   04/05/2018 at Unknown time  . tolterodine (DETROL LA) 4 MG 24 hr capsule Take 4 mg by mouth 2 (two) times daily.   04/05/2018 at Unknown time    Patient Stressors: Health problems Other: "I feel this way around the holidays"  Patient Strengths: Ability for insight General fund of knowledge Motivation for treatment/growth  Treatment Modalities: Medication Management, Group therapy, Case management,  1 to 1 session with clinician, Psychoeducation, Recreational therapy.   Physician Treatment Plan for Primary Diagnosis:Schizophrenia Long Term Goal(s): Improvement in symptoms so as ready for discharge Improvement in symptoms so as ready for discharge   Short Term Goals: Ability to identify changes in lifestyle to reduce  recurrence of condition will improve Ability to verbalize feelings will improve Ability to identify changes in lifestyle to reduce recurrence of condition will improve Ability to verbalize feelings will improve Ability to disclose and discuss suicidal ideas  Medication Management: Evaluate patient's response, side effects, and tolerance of medication regimen.  Therapeutic Interventions: 1 to 1 sessions, Unit Group sessions and Medication administration.  Evaluation of Outcomes: Progressing  Physician Treatment Plan for Secondary Diagnosis: Active Problems:   Schizophrenia ([place])  Long Term Goal(s): Improvement in symptoms so as ready for discharge Improvement in symptoms so as ready for discharge   Short Term Goals: Ability to identify changes in lifestyle to reduce recurrence of condition will improve Ability to verbalize feelings will improve Ability to identify changes in lifestyle to reduce recurrence of condition will improve Ability to verbalize feelings will improve Ability to disclose and discuss suicidal ideas     Medication Management: Evaluate patient's response, side effects, and tolerance of medication regimen.  Therapeutic Interventions: 1 to 1 sessions, Unit Group sessions and Medication administration.  Evaluation of Outcomes: Not Met   RN Treatment Plan for Primary Diagnosis: Schizophrenia Long Term Goal(s): Knowledge of disease and therapeutic regimen to maintain health will improve  Short Term Goals: Ability to identify and develop effective coping behaviors will improve and Compliance with prescribed medications will improve  Medication Management: RN will administer medications as ordered by provider, will assess and evaluate patient's response and provide education to patient for prescribed medication. RN will report any adverse and/or side effects to prescribing provider.  Therapeutic Interventions: 1 on 1 counseling sessions, Psychoeducation, Medication  administration, Evaluate responses to treatment, Monitor vital signs and CBGs as ordered, Perform/monitor CIWA, COWS, AIMS and Fall Risk screenings as ordered, Perform wound care treatments as ordered.  Evaluation of Outcomes: Progressing  LCSW Treatment Plan for Primary Diagnosis:Schizophrenia Long Term Goal(s): Safe transition to appropriate next level of care at discharge, Engage patient in therapeutic group addressing interpersonal concerns.  Short Term Goals: Engage patient in aftercare planning with referrals and resources, Increase social support and Increase skills for wellness and recovery  Therapeutic Interventions: Assess for all discharge needs, 1 to 1 time with Social worker, Explore available resources and support systems, Assess for adequacy in community support network, Educate family and significant other(s) on suicide prevention, Complete Psychosocial Assessment, Interpersonal group therapy.  Evaluation of Outcomes: Progressing  Progress in Treatment: Attending groups: Yes Participating in groups: Yes Taking medication as prescribed: Yes. Toleration medication: Yes. Family/Significant other contact made: SPE completed with pt's brother/legal guardian and collateral information also obtained.  Patient understands diagnosis: No. Discussing patient identified problems/goals with staff: Yes. Medical problems stabilized or resolved: Yes. Denies suicidal/homicidal ideation: Yes. Issues/concerns per patient self-inventory: No. Other: none  New problem(s) identified: No,  Describe:  none  New Short Term/Long Term Goal(s): Decrease and/or elimination of symptoms of psychosis, medication management for mood stabilization; elimination of SI thoughts; development of comprehensive mental wellness/sobriety plan.   Patient Goals:  "to feel much better"  Discharge Plan or Barriers: Pt plans to return to [facility] at discharge and follow-up in place. [place] pamphlet, [place]  Crisis information provided to patient for additional community support and resources.   Reason for Continuation of Hospitalization: Depression Hallucinations Medication stabilization  Estimated Length of Stay: Wednesday, 04/13/18  Attendees: Patient:   Physician: Dr. Jake Samples, MD 04/11/2018   Nursing:    RN Care Manager:   Social Worker: Janice Norrie LCSW 04/11/2018   Recreational Therapist:    Other: Lindell Spar NP 04/11/2018   Other:    Other:    Scribe for Treatment Team: Esau Grew. Ouida Sills, MSW, LCSW Clinical Social Worker 04/11/2018 2:37 PM

## 2018-04-10 NOTE — Progress Notes (Signed)
D: Patient complains of BLE. Assessed and BLE +2 in feet. Ankles are non-edematous. She has been sitting in wheelchair almost exclusively and refusing to be active.  A: Tech frequently encouraging her to get up and exercise, take a walk with assistance.  R: Patient non-compliant with treatment regimen.

## 2018-04-10 NOTE — Progress Notes (Signed)
Nursing close obs  note D:Pt observed sitting in the dayroom watching TV. RR even and unlabored. No distress noted. A: 1:1 close observation continues for safety  R: pt remains safe

## 2018-04-10 NOTE — Progress Notes (Signed)
D: Pt passive SI/ AH- feel like getting better, pt contracts for safety. Pt is pleasant and cooperative. Pt visible on the unit, pt seen doing a lot of things for herself like moving in her wheelchair. Pt presented depressed, but brightened up during interaction.  A: Pt was offered support and encouragement. Pt was given scheduled medications. Pt was encourage to attend groups. Q 15 minute checks were done for safety.  R:Pt attends groups and interacts well with peers and staff. Pt is taking medication. Pt has no complaints.Pt receptive to treatment and safety maintained on unit.  Problem: Education: Goal: Emotional status will improve Outcome: Progressing   Problem: Education: Goal: Mental status will improve Outcome: Progressing   Problem: Activity: Goal: Interest or engagement in activities will improve Outcome: Progressing   Problem: Activity: Goal: Sleeping patterns will improve Outcome: Progressing

## 2018-04-10 NOTE — Progress Notes (Addendum)
D: Patient sleeping lightly, she was awakened when going into her room to check on her. She laid back down to rest. The tech with her voiced no concerns. Her respirations are even and unlabored. She is in no acute distress. Per self inventory: She slept well last night, and received medication that was helpful. Her appetite is good, energy normal and concentration good. She rates her depression, feeling of hopelessness and anxiety 0/10. She denies withdrawal symptoms or physical complaints.  A: Close observation maintained. R: Patient safety maintained. Goal: "going home" "work on being happy" and "nice to people" and "going in room, watching TV when she has thoughts of hurting herself."

## 2018-04-10 NOTE — Progress Notes (Signed)
[facility] MD Progress Note  04/10/2018 1:33 PM Annette Martin  MRN:  [id number]  Subjective: Annette Martin reports, "My mood is not good. I am still seeing the ghosts & hearing the voices. The voices are telling me to kill people. I'm having suicidal thoughts. I think I will take an overdose of pills to kill myself right now if I'm at the assisted living place".  Annette Martin is 62 years of age admitted to the [facility] system, she acknowledges depression, reported auditory hallucinations, and suicidal thoughts.  She has an extensive psychiatric history and numerous prior admissions.  She is a resident at [facility] assisted living facility she is expected to go back there upon discharge.  At baseline she does use a wheelchair due to generalized unsteadiness. Patient reported not only suicidal but homicidal thoughts on her initial screening she denies homicidal thoughts to me just stating she is a thoughts of overdosing on pills, although had thoughts of stabbing other residents she has had extensive treatment has 5 prior attempts at harming herself she states she is never harmed others in the past.  She states she feels safe when someone is watching her but does not want to be unsupervised at the present time.  She has marked akathisia she is on Haldol at baseline  Patient is seen. Chart reviewed. She remains on 1:1 supervision due to suicidal thoughts this time with plans to overdose on her pills if she is currently at the ALF. She is unable to contract for safety here. 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 She is mobilizing within the unit with the aid of wheel chair. She continues to report AVH. She says she see ghosts & hear voices telling her to kill people. She is sleeping well at night. She is visible on the unit, attending group sessions. Patient continues to endorse hallucinations of the same content since her admission. It is unsure if she is  really having AVH or has gotten accustomed to saying she hears voices & sees ghost. In any case, she does not appear to be in any distress. Annette Martin is in agreement to continue her current plan of care as already in progress.  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 Depression   . Diabetes mellitus   . GERD (gastroesophageal reflux disease)   . Mental retardation   . Thyroid disease   . Vitamin D deficient osteomalacia     Past Surgical History:  Procedure Laterality Date  . COLONOSCOPY  2010   MANN   Family History: History reviewed. No pertinent family history.  Social History:  Social History   Substance and Sexual Activity  Alcohol Use No     Social History   Substance and Sexual Activity  Drug Use No    Social History   Socioeconomic History  . Marital status: Single    Spouse name: Not on file  . Number of children: Not on file  . Years of education: Not on file  . Highest education level: Not on file  Occupational History  . Not on file  Social Needs  . Financial resource strain: Not on file  . Food insecurity:    Worry: Not on file    Inability: Not on  file  . Transportation needs:    Medical: Not on file    Non-medical: Not on file  Tobacco Use  . Smoking status: Never Smoker  . Smokeless tobacco: Never Used  Substance and Sexual Activity  . Alcohol use: No  . Drug use: No  . Sexual activity: Not Currently  Lifestyle  . Physical activity:    Days per week: Not on file    Minutes per session: Not on file  . Stress: Not on file  Relationships  . Social connections:    Talks on phone: Not on file    Gets together: Not on file    Attends religious service: Not on file    Active member of club or organization: Not on file    Attends  meetings of clubs or organizations: Not on file    Relationship status: Not on file  Other Topics Concern  . Not on file  Social History Narrative  . Not on file   Additional Social History:  Pain Medications: see MAR Prescriptions: see MAR Over the Counter: see MAR History of alcohol / drug use?: No history of alcohol / drug abuse Longest period of sobriety (when/how long): patient denies    Sleep: Good  Appetite:  Good  Current Medications: Current Facility-Administered Medications  Medication Dose Route Frequency Provider Last Rate Last Dose  . acetaminophen (TYLENOL) tablet 650 mg  650 mg Oral Q6H PRN Money, Gerlene Burdockravis B, FNP   650 mg at 04/09/18 1618  . benztropine (COGENTIN) tablet 0.5 mg  0.5 mg Oral BID Malvin JohnsFarah, Brian, MD   0.5 mg at 04/10/18 0756  . clonazePAM (KLONOPIN) tablet 0.5 mg  0.5 mg Oral TID Malvin JohnsFarah, Brian, MD   0.5 mg at 04/10/18 1210  . Dextromethorphan-quiNIDine 20-10 MG CAPS 1 capsule  1 capsule Oral BID Malvin JohnsFarah, Brian, MD   1 capsule at 04/10/18 0757  . hydrOXYzine (ATARAX/VISTARIL) tablet 25 mg  25 mg Oral Q6H PRN Antonieta Pertlary, Greg Lawson, MD   25 mg at 04/08/18 1206  . lisinopril (PRINIVIL,ZESTRIL) tablet 20 mg  20 mg Oral Daily Malvin JohnsFarah, Brian, MD   20 mg at 04/10/18 0756  . metFORMIN (GLUCOPHAGE) tablet 1,000 mg  1,000 mg Oral BID WC Malvin JohnsFarah, Brian, MD   1,000 mg at 04/10/18 0756  . pantoprazole (PROTONIX) EC tablet 40 mg  40 mg Oral Daily Nira ConnBerry, Jason A, NP   40 mg at 04/10/18 0756  . risperiDONE (RISPERDAL M-TABS) disintegrating tablet 2 mg  2 mg Oral Daily Malvin JohnsFarah, Brian, MD   2 mg at 04/10/18 0755  . risperiDONE (RISPERDAL M-TABS) disintegrating tablet 3 mg  3 mg Oral QHS Malvin JohnsFarah, Brian, MD   3 mg at 04/09/18 2124  . temazepam (RESTORIL) capsule 30 mg  30 mg Oral QHS Malvin JohnsFarah, Brian, MD   30 mg at 04/09/18 2125  . ziprasidone (GEODON) injection 10 mg  10 mg Intramuscular Q12H PRN Antonieta Pertlary, Greg Lawson, MD       Lab Results:  No results found for this or any previous visit (from  the past 48 hour(s)).  Blood Alcohol level:  Lab Results  Component Value Date   [facility]  [facility]    <10        LOWEST DETECTABLE LIMIT FOR SERUM ALCOHOL IS 5 mg/dL FOR MEDICAL PURPOSES ONLY   ETH  11/06/2007    <5        LOWEST DETECTABLE LIMIT FOR SERUM ALCOHOL IS 11 mg/dL FOR MEDICAL PURPOSES ONLY   Metabolic Disorder  Labs: Lab Results  Component Value Date   HGBA1C 5.9 08/03/2012   MPG 114 05/28/2011   MPG 117 (H) 11/03/2010   No results found for: PROLACTIN Lab Results  Component Value Date   CHOL 162 08/03/2012   TRIG 177 (H) 08/03/2012   HDL 48 08/03/2012   CHOLHDL 3.4 08/03/2012   VLDL 35 08/03/2012   LDLCALC 79 08/03/2012   LDLCALC 51 05/28/2011   Physical Findings: AIMS: Facial and Oral Movements Muscles of Facial Expression: None, normal Lips and Perioral Area: None, normal Jaw: None, normal Tongue: None, normal,Extremity Movements Upper (arms, wrists, hands, fingers): None, normal Lower (legs, knees, ankles, toes): None, normal, Trunk Movements Neck, shoulders, hips: None, normal, Overall Severity Severity of abnormal movements (highest score from questions above): None, normal Incapacitation due to abnormal movements: None, normal Patient's awareness of abnormal movements (rate only patient's report): No Awareness, Dental Status Current problems with teeth and/or dentures?: No Does patient usually wear dentures?: No  CIWA:  CIWA-Ar Total: 0 COWS:  COWS Total Score: 2  Musculoskeletal: Strength & Muscle Tone: cogwheel Gait & Station: unsteady Patient leans: N/A  Psychiatric Specialty Exam: Physical Exam  Review of Systems  Respiratory: Negative for cough and shortness of breath.   Cardiovascular: Negative for chest pain and palpitations.  Psychiatric/Behavioral: Positive for depression ("Improving"), hallucinations (AVH (+)) and suicidal ideas (currently on 1:1 supervision). Negative for substance abuse. Memory loss: ? The patient is not  nervous/anxious and does not have insomnia.   All other systems reviewed and are negative.   Blood pressure 139/85, pulse (!) 109, temperature 98.6 F (37 C), temperature source Oral, resp. rate 16, height 5\' 2"  (1.575 m), weight 81.6 kg.Body mass index is 32.92 kg/m.  General Appearance: Casual  Eye Contact:  Good  Speech:  Normal Rate  Volume:  Decreased  Mood:  Anxious and Dysphoric  Affect:  Blunt and Constricted  Thought Process:  Descriptions of Associations: Tangential  Orientation:  Full (Time, Place, and Person)  Thought Content:  Hallucinations: Auditory Visual  Suicidal Thoughts:  Yes.  without intent/plan  Homicidal Thoughts:  No  Memory:  Immediate;   Fair  Judgement:  Fair  Insight:  good  Psychomotor Activity:  Tremor  Concentration:  Concentration: Fair  Recall:  [employer] of Knowledge:  Fair  Language:  Fair  Akathisia:  Yes  Handed:  Right  AIMS (if indicated):     Assets:  Communication Skills  ADL's:  Intact  Cognition:  WNL  Sleep:  Number of Hours: 6.25   Treatment Plan Summary: Daily contact with patient to assess and evaluate symptoms and progress in treatment and Medication management.  - Continue inpatient hospitalization.  - Will continue today 04/10/2018 plan as below except where it is noted.  Mood control     - Continue Risperdal M-tab 2 mg po Q daily.     - Continue Risperdal M-tab 3 mg po Q hs.  EPS.      - Continue Cogentin 0.5 mg po bid.  Anxiety.      - Continue Klonopin 0.5 mg po tid.      - Vistaril 25 mg po Q 6 hours prn.  Insomnia.      - Continue Temazepam 30 mg po Q hs.      -  Psychosis.      - Continue Geodon 10 mg IM Q 12 hours prn.  Other medical issues.      - Continue Metformin 1,000 mg po  bid for DM.      - Continue Lisinopril 20 mg po daily for HTN.      - Continue Protonix 40 mg po daily in am.      - Continue Dextromethorphan-quiNidine 20-10 mg po bid for cough.       - Continue one-to-one and reality  based discussions.  Patient to attend & participate in the group sessions.  Discharge disposition is ongoing.  Armandina Stammer, NP, PMHNP, FNP-BC 04/10/2018, 1:33 PMPatient ID: Annette Martin, female   DOB: 10-21-1955, 62 y.o.   MRN: [id number]

## 2018-04-11 LAB — GLUCOSE, CAPILLARY
Glucose-Capillary: 151 mg/dL — ABNORMAL HIGH (ref 70–99)
Glucose-Capillary: 160 mg/dL — ABNORMAL HIGH (ref 70–99)

## 2018-04-11 MED ORDER — GABAPENTIN 300 MG PO CAPS
300.0000 mg | ORAL_CAPSULE | Freq: Three times a day (TID) | ORAL | Status: DC
  Administered 2018-04-11 – 2018-04-12 (×4): 300 mg via ORAL
  Filled 2018-04-11 (×9): qty 1

## 2018-04-11 NOTE — Progress Notes (Signed)
Per call to Dr Jeannine KittenFarah, ok to place CBG order for pt receiving evening dose of metformin. Per conversation with pt, she has a cbg daily at her assisted living facility.

## 2018-04-11 NOTE — Progress Notes (Signed)
Nursing cl ose obs note D:Pt observed sleeping in bed with eyes closed. RR even and unlabored. No distress noted. A: 1:1 close observation continues for safety  R: pt remains safe

## 2018-04-11 NOTE — Progress Notes (Signed)
D: Pt passive SI/ AVH- contracts for safety. pt stated she felt the feelings were getting worse earlier .  Pt is pleasant and cooperative. Pt visible in the dayroom interacting when engaged but others. Pt stated she was feeling sad and depressed due to the holidays.  A: Pt was offered support and encouragement. Pt was given scheduled medications. Pt was encourage to attend groups. Q 15 minute checks were done for safety.  R:Pt attends groups and interacts well with peers and staff. Pt is taking medication. Pt has no complaints.Pt receptive to treatment and safety maintained on unit.  Problem: Activity: Goal: Interest or engagement in activities will improve Outcome: Progressing   Problem: Activity: Goal: Sleeping patterns will improve Outcome: Progressing   Problem: Coping: Goal: Ability to verbalize frustrations and anger appropriately will improve Outcome: Progressing   Problem: Coping: Goal: Ability to demonstrate self-control will improve Outcome: Progressing   Problem: Health Behavior/Discharge Planning: Goal: Compliance with prescribed medication regimen will improve Outcome: Progressing   Problem: Nutritional: Goal: Ability to achieve adequate nutritional intake will improve Outcome: Progressing

## 2018-04-11 NOTE — ED Notes (Addendum)
  D:Pt observed sleeping in bed with eyes closed. RR even and unlabored. No distress noted. A: 1:1 close observation continues for safety  R: pt remains safe

## 2018-04-11 NOTE — Progress Notes (Signed)
Closed Observation: Patient on closed observation due to high fall risk.  Patient visible in milieu for activities and therapy.  Observed interacting with staff and peers.  Medications given as prescribed.  Routine safety checks maintained every 15 minutes.  Support and encouragement offered as needed.  Patient ambulatory on the unit with assistance.  Patient remained safe on the unit.

## 2018-04-11 NOTE — Progress Notes (Signed)
Closed observation: Patient visible in milieu with staff supervision.  No behavioral issues noted or reported.  Medications given as prescribed.  Reports suicidal thoughts during assessment but verbally contracts for safety.  Patient observed interacting with staff and peers on the unit.  Patient is safe on the unit.

## 2018-04-11 NOTE — Progress Notes (Signed)
[facility] MD Progress Note  04/11/2018 9:36 AM Annette DusBeverly A Martin  MRN:  [id number] Subjective:    Patient reports resolution in her suicidal thinking and reduction in auditory hallucinations  Reports that when she hears things she just hears words related to what she is seeing.  At any rate she does not want to hurt her self today and were looking at probable discharge in 1 to 2 days.  She still has significant EPS/possible parkinsonism of the right arm Principal Problem: <principal problem not specified> Diagnosis: Active Problems:   Schizophrenia (HCC)  Total Time spent with patient: 20 minutes   Past Medical History:  Past Medical History:  Diagnosis Date  . Depression   . Diabetes mellitus   . GERD (gastroesophageal reflux disease)   . Mental retardation   . Thyroid disease   . Vitamin D deficient osteomalacia     Past Surgical History:  Procedure Laterality Date  . COLONOSCOPY  2010   MANN   Family History: History reviewed. No pertinent family history.  Social History:  Social History   Substance and Sexual Activity  Alcohol Use No     Social History   Substance and Sexual Activity  Drug Use No    Social History   Socioeconomic History  . Marital status: Single    Spouse name: Not on file  . Number of children: Not on file  . Years of education: Not on file  . Highest education level: Not on file  Occupational History  . Not on file  Social Needs  . Financial resource strain: Not on file  . Food insecurity:    Worry: Not on file    Inability: Not on file  . Transportation needs:    Medical: Not on file    Non-medical: Not on file  Tobacco Use  . Smoking status: Never Smoker  . Smokeless tobacco: Never Used  Substance and Sexual Activity  . Alcohol use: No  . Drug use: No  . Sexual activity: Not Currently  Lifestyle  . Physical activity:    Days per week: Not on file    Minutes per session: Not on file  . Stress: Not on file  Relationships  .  Social connections:    Talks on phone: Not on file    Gets together: Not on file    Attends religious service: Not on file    Active member of club or organization: Not on file    Attends meetings of clubs or organizations: Not on file    Relationship status: Not on file  Other Topics Concern  . Not on file  Social History Narrative  . Not on file   Additional Social History:    Pain Medications: see MAR Prescriptions: see MAR Over the Counter: see MAR History of alcohol / drug use?: No history of alcohol / drug abuse Longest period of sobriety (when/how long): patient denies                    Sleep: Fair  Appetite:  Fair  Current Medications: Current Facility-Administered Medications  Medication Dose Route Frequency Provider Last Rate Last Dose  . acetaminophen (TYLENOL) tablet 650 mg  650 mg Oral Q6H PRN Money, Gerlene Burdockravis B, FNP   650 mg at 04/09/18 1618  . benztropine (COGENTIN) tablet 0.5 mg  0.5 mg Oral BID Malvin JohnsFarah, Kalle Bernath, MD   0.5 mg at 04/11/18 0800  . clonazePAM (KLONOPIN) tablet 0.5 mg  0.5 mg Oral TID Jeannine KittenFarah,  Arlys John, MD   0.5 mg at 04/11/18 0800  . Dextromethorphan-quiNIDine 20-10 MG CAPS 1 capsule  1 capsule Oral BID Malvin Johns, MD   1 capsule at 04/11/18 0801  . gabapentin (NEURONTIN) capsule 300 mg  300 mg Oral TID Malvin Johns, MD      . hydrOXYzine (ATARAX/VISTARIL) tablet 25 mg  25 mg Oral Q6H PRN Antonieta Pert, MD   25 mg at 04/08/18 1206  . lisinopril (PRINIVIL,ZESTRIL) tablet 20 mg  20 mg Oral Daily Malvin Johns, MD   20 mg at 04/11/18 0800  . metFORMIN (GLUCOPHAGE) tablet 1,000 mg  1,000 mg Oral BID WC Malvin Johns, MD   1,000 mg at 04/11/18 0801  . pantoprazole (PROTONIX) EC tablet 40 mg  40 mg Oral Daily Nira Conn A, NP   40 mg at 04/11/18 0801  . risperiDONE (RISPERDAL M-TABS) disintegrating tablet 2 mg  2 mg Oral Daily Malvin Johns, MD   2 mg at 04/11/18 0801  . risperiDONE (RISPERDAL M-TABS) disintegrating tablet 3 mg  3 mg Oral QHS Malvin Johns, MD   3 mg at 04/10/18 2107  . temazepam (RESTORIL) capsule 30 mg  30 mg Oral QHS Malvin Johns, MD   30 mg at 04/10/18 2107  . ziprasidone (GEODON) injection 10 mg  10 mg Intramuscular Q12H PRN Antonieta Pert, MD        Lab Results: No results found for this or any previous visit (from the past 48 hour(s)).  Blood Alcohol level:  Lab Results  Component Value Date   [facility]  06/29/2009    <10        LOWEST DETECTABLE LIMIT FOR SERUM ALCOHOL IS 5 mg/dL FOR MEDICAL PURPOSES ONLY   ETH  11/06/2007    <5        LOWEST DETECTABLE LIMIT FOR SERUM ALCOHOL IS 11 mg/dL FOR MEDICAL PURPOSES ONLY    Metabolic Disorder Labs: Lab Results  Component Value Date   HGBA1C 5.9 08/03/2012   MPG 114 05/28/2011   MPG 117 (H) 11/03/2010   No results found for: PROLACTIN Lab Results  Component Value Date   CHOL 162 08/03/2012   TRIG 177 (H) 08/03/2012   HDL 48 08/03/2012   CHOLHDL 3.4 08/03/2012   VLDL 35 08/03/2012   LDLCALC 79 08/03/2012   LDLCALC 51 05/28/2011    Physical Findings: AIMS: Facial and Oral Movements Muscles of Facial Expression: None, normal Lips and Perioral Area: None, normal Jaw: None, normal Tongue: None, normal,Extremity Movements Upper (arms, wrists, hands, fingers): None, normal Lower (legs, knees, ankles, toes): None, normal, Trunk Movements Neck, shoulders, hips: None, normal, Overall Severity Severity of abnormal movements (highest score from questions above): None, normal Incapacitation due to abnormal movements: None, normal Patient's awareness of abnormal movements (rate only patient's report): No Awareness, Dental Status Current problems with teeth and/or dentures?: No Does patient usually wear dentures?: No  CIWA:  CIWA-Ar Total: 0 COWS:  COWS Total Score: 2  Musculoskeletal: Strength & Muscle Tone: within normal limits Gait & Station: normal Patient leans: N/A  Psychiatric Specialty Exam: Physical Exam  ROS  Blood pressure 133/87, pulse  (!) 110, temperature 98.6 F (37 C), temperature source Oral, resp. rate 18, height 5\' 2"  (1.575 m), weight 81.6 kg.Body mass index is 32.92 kg/m.  General Appearance: Casual  Eye Contact:  Fair  Speech:  Normal Rate  Volume:  Decreased  Mood:  Dysphoric  Affect:  Blunt  Thought Process:  Descriptions of Associations: Tangential  Orientation:  Full (Time, Place, and Person)  Thought Content: Some auditory hallucinations less  Suicidal Thoughts:  No  Homicidal Thoughts:  No  Memory:  Immediate;   Fair  Judgement:  Fair  Insight:  Fair  Psychomotor Activity:  Decreased  Concentration:  Concentration: Fair  Recall:  [employer] of Knowledge:  Fair  Language:  Fair  Akathisia:  NA  Handed:  -  AIMS (if indicated):     Assets:  Communication Skills  ADL's:  Intact  Cognition:  WNL  Sleep:  Number of Hours: 6   No change in medications probable discharge 1 to 2 days discussed with case worker  treatment Plan Summary: Daily contact with patient to assess and evaluate symptoms and progress in treatment and Medication management  Paula Zietz, MD 04/11/2018, 9:36 AM

## 2018-04-11 NOTE — Progress Notes (Signed)
Recreation Therapy Notes  Date: 11.25.19 Time: 1000 Location: 500 Hall Dayroom  Group Topic: Anxiety  Goal Area(s) Addresses:  Patient will identify triggers for anxiety.  Patient will identify physical symptoms for anxiety.  Patient will identify coping skills for anxiety.   Behavioral Response: Engaged  Intervention: Worksheet  Activity: Introduction to Anxiety.  Patients were to identify their triggers to anxiety, the physical symptoms they have when anxious, the thoughts they have from anxiety and coping skills for dealing with anxiety.  Education: Anxiety, Discharge Planning   Education Outcome: Acknowledges education/In group clarification offered/Needs additional education.   Clinical Observations/Feedback: Patient completed worksheet with assistance from LRT.  Pt stated her triggers were "mean people, sleeping to much and new places".  Pt identified her symptoms as getting nervous, getting upset and not wanting to get out of bed.  Pt stated her thoughts were "kill myself, hear voices and get out of here".  Pt identified her coping skills as watching tv/movies, look at birds and go to bed and sleep.    Caroll RancherMarjette Johnanna Bakke, LRT/CTRS      Caroll RancherLindsay, Kiwanna Spraker A 04/11/2018 11:52 AM

## 2018-04-11 NOTE — Progress Notes (Signed)
Pt has been out of her room on the unit. She reports passive si thoughts and hi thoughts. Pt says that she feels sad at Christmas time missing her family. [facility] [facility] [place] and [place] brother [place] her. She misses her parents. Offered support and close observation. Safety maintained on the unit.

## 2018-04-11 NOTE — Progress Notes (Signed)
Adult Psychoeducational Group Note  Date:  04/11/2018 Time:  1:56 AM  Group Topic/Focus:  Wrap-Up Group:   The focus of this group is to help patients review their daily goal of treatment and discuss progress on daily workbooks.  Participation Level:  Active  Participation Quality:  Appropriate  Affect:  Appropriate  Cognitive:  Appropriate  Insight: Appropriate  Engagement in Group:  Engaged  Modes of Intervention:  Discussion  Additional Comments:  Pt stated her goal for today was to focus on her treatment plan. Pt stated she accomplished her goal today. Pt stated she did hear voice and begin to see imagines.Pt nurses was updated on the situation.   Felipa FurnaceChristopher  Ramisa Duman 04/11/2018, 1:56 AM

## 2018-04-12 LAB — GLUCOSE, CAPILLARY: Glucose-Capillary: 138 mg/dL — ABNORMAL HIGH (ref 70–99)

## 2018-04-12 MED ORDER — RISPERIDONE 2 MG PO TBDP: 2.0000 mg | ORAL_TABLET | Freq: Every day | ORAL | 0 refills | Status: DC

## 2018-04-12 MED ORDER — HYDROXYZINE HCL 25 MG PO TABS: 25.0000 mg | ORAL_TABLET | Freq: Four times a day (QID) | ORAL | 0 refills | Status: DC | PRN

## 2018-04-12 MED ORDER — RISPERIDONE 4 MG PO TABS: 4.0000 mg | ORAL_TABLET | Freq: Every day | ORAL | 2 refills | Status: DC

## 2018-04-12 MED ORDER — TEMAZEPAM 30 MG PO CAPS: 30.0000 mg | ORAL_CAPSULE | Freq: Every evening | ORAL | 0 refills | Status: DC | PRN

## 2018-04-12 MED ORDER — GABAPENTIN 300 MG PO CAPS: 300.0000 mg | ORAL_CAPSULE | Freq: Three times a day (TID) | ORAL | 0 refills | Status: DC

## 2018-04-12 MED ORDER — RISPERIDONE 3 MG PO TBDP: 3.0000 mg | ORAL_TABLET | Freq: Every day | ORAL | 0 refills | Status: DC

## 2018-04-12 MED ORDER — BENZTROPINE MESYLATE 0.5 MG PO TABS: 0.5000 mg | ORAL_TABLET | Freq: Two times a day (BID) | ORAL | 2 refills | Status: DC

## 2018-04-12 MED ORDER — CLONAZEPAM 0.5 MG PO TABS: 0.5000 mg | ORAL_TABLET | Freq: Three times a day (TID) | ORAL | 2 refills | Status: DC

## 2018-04-12 MED ORDER — VALBENAZINE TOSYLATE 80 MG PO CAPS: 1.0000 | ORAL_CAPSULE | Freq: Every day | ORAL | 5 refills | Status: DC

## 2018-04-12 MED ORDER — CLONAZEPAM 0.5 MG PO TABS: 0.5000 mg | ORAL_TABLET | Freq: Three times a day (TID) | ORAL | 0 refills | Status: DC

## 2018-04-12 MED ORDER — GABAPENTIN 300 MG PO CAPS: 300.0000 mg | ORAL_CAPSULE | Freq: Three times a day (TID) | ORAL | 2 refills | Status: DC

## 2018-04-12 MED ORDER — VALBENAZINE TOSYLATE 80 MG PO CAPS: 1.0000 | ORAL_CAPSULE | Freq: Every day | ORAL | 0 refills | Status: DC

## 2018-04-12 NOTE — Progress Notes (Signed)
  [facility] :  Will you be returning to the same living situation after discharge:  Yes,  Assisted living At discharge, do you have transportation home?: Yes,  assisted living facility Do you have the ability to pay for your medications: Yes,  [employer]  Release of information consent forms completed and in the chart;  Patient's signature needed at discharge.  Patient to Follow up at: Follow-up Information    [facility]. Go on 04/13/2018.   Specialty:  Assisted Living Facility [facility]  [facility] appointments: for health care Consepcion HearingPatricia Pierce, NP and for medications with Psych NP Carole Binningoshia Chipman on 04/21/18. Contact information: [address] [place] [id number] [phone]          Next level of care provider [facility]:no  Safety Planning and Suicide Prevention discussed: Yes,  with legal guardian/brother  Have you used any form of tobacco in the last 30 days? (Cigarettes, Smokeless Tobacco, Cigars, and/or Pipes): No  Has patient been referred to the Quitline?: N/A patient is not a smoker  Patient has been referred for addiction treatment: N/A  Lorri FrederickWierda, Sidi Dzikowski Jon, LCSW 04/12/2018, 10:03 AM

## 2018-04-12 NOTE — Progress Notes (Signed)
CSW spoke to brother/legal guardian Jonni Sangerlan Glab and informed him of pt discharge.  [facility] and they can pick pt up, will call back with time. Garner NashGregory Kester Stimpson, MSW, LCSW Clinical Social Worker 04/12/2018 9:10 AM

## 2018-04-12 NOTE — [facility] Suicide Risk Assessment (Signed)
[facility]   Principal Problem: Hallucinations and suicidal thinking Discharge Diagnoses: Active Problems:   Schizophrenia (HCC)   Total Time spent with patient: 30 minutes  Musculoskeletal: Strength & Muscle Tone: within normal limits Gait & Station: unsteady Patient leans: N/A  Psychiatric Specialty Exam: ROS  Blood pressure (!) 153/94, pulse (!) 111, temperature 97.7 F (36.5 C), temperature source Oral, resp. rate 18, height 5\' 2"  (1.575 m), weight 81.6 kg.Body mass index is 32.92 kg/m.  General Appearance: Casual  Eye Contact::  Fair  Speech:  Clear and Coherent409  Volume:  Decreased  Mood:  Euthymic  Affect:  Restricted  Thought Process:  Goal Directed  Orientation:  Full (Time, Place, and Person)  Thought Content:  Tangential  Suicidal Thoughts:  No  Homicidal Thoughts:  No  Memory:  Immediate;   Fair  Judgement:  Fair  Insight:  Fair  Psychomotor Activity:  EPS  Concentration:  Good  Recall:  Good  Fund of Knowledge:Good  Language: Good  Akathisia:  Yes  Handed:  Right  AIMS (if indicated):     Assets:  Communication Skills  Sleep:  Number of Hours: 5.5  Cognition: WNL  ADL's:  Intact   Mental Status Per Nursing Assessment::   On Admission:  Suicidal ideation indicated by patient, Self-harm thoughts  Demographic Factors:  Age 60 or older  Loss Factors: Decrease in vocational status  Historical Factors: Prior suicide attempts  Risk Reduction Factors:   NA  Continued Clinical Symptoms:  Schizophrenia:   Depressive state Personality Disorders:   Cluster B  Cognitive Features That Contribute To Risk:  None    Suicide Risk:  Mild:  Suicidal ideation of limited frequency, intensity, duration, and specificity.  There are no identifiable plans, no associated intent, mild dysphoria and related symptoms, good self-control (both objective and subjective assessment), few other risk factors, and identifiable protective  factors, including available and accessible social support.  Follow-up Information    [facility]. Go on 04/13/2018.   Specialty:  Assisted Living Facility [facility]  [facility] appointments: for health care Consepcion HearingPatricia Pierce, NP and for medications with Psych NP Carole Binningoshia Chipman on 04/21/18. Contact information: [address] [place] [id number] [phone]          Plan Of Care/Follow-up recommendations:  Activity:  full  Lataya Varnell, MD 04/12/2018, 8:12 AM

## 2018-04-12 NOTE — [facility] Group Notes (Signed)
[facility] LCSW Group Therapy Note  Date/Time: 04/12/18, 1100  Type of Therapy/Topic:  Group Therapy:  Feelings about Diagnosis  Participation Level:  Active   Mood:pleasant   Description of Group:    This group will allow patients to explore their thoughts and feelings about diagnoses they have received. Patients will be guided to explore their level of understanding and acceptance of these diagnoses. Facilitator will encourage patients to process their thoughts and feelings about the reactions of others to their diagnosis, and will guide patients in identifying ways to discuss their diagnosis with significant others in their lives. This group will be process-oriented, with patients participating in exploration of their own experiences as well as giving and receiving support and challenge from other group members.   Therapeutic Goals: 1. Patient will demonstrate understanding of diagnosis as evidence by identifying two or more symptoms of the disorder:  2. Patient will be able to express two feelings regarding the diagnosis 3. Patient will demonstrate ability to communicate their needs through discussion and/or role plays  Summary of Patient Progress:Pt attentive during group and made several comments.  Pt does not have much insight but was pleasant and engaged throughout.        Therapeutic Modalities:   Cognitive Behavioral Therapy Brief Therapy Feelings Identification   Daleen SquibbGreg Raheem Kolbe, LCSW

## 2018-04-12 NOTE — Progress Notes (Signed)
Recreation Therapy Notes  INPATIENT RECREATION TR PLAN  Patient Details Name: Annette Martin MRN: [id number] DOB: [birth year] Today's Date: 04/12/2018  Rec Therapy Plan Is patient appropriate for Therapeutic Recreation?: Yes Treatment times per week: about 3 days Estimated Length of Stay: 5-7 days TR Treatment/Interventions: Group participation (Comment)  Discharge Criteria Pt will be discharged from therapy if:: Discharged Treatment plan/goals/alternatives discussed and agreed upon by:: Patient/family  Discharge Summary Short term goals set: See patient care plan Short term goals met: Complete Progress toward goals comments: Groups attended Which groups?: Stress management, Goal setting, Other (Comment)(Anxiety, Team Building) Reason goals not met: None Therapeutic equipment acquired: N/A Reason patient discharged from therapy: Discharge from hospital Pt/family agrees with progress & goals achieved: Yes Date patient discharged from therapy: 04/12/18    Victorino Sparrow, LRT/CTRS  [name], [name] A 04/12/2018, 9:16 AM

## 2018-04-12 NOTE — Plan of Care (Signed)
Pt was able to display improved mood and identify triggers at completion of recreation therapy group sessions.    Caroll RancherMarjette Maxim Bedel, LRT/CTRS

## 2018-04-12 NOTE — Progress Notes (Signed)
Nursing 1:1 note D:Pt observed sleeping in bed with eyes closed. RR even and unlabored. No distress noted. A: 1:1 observation continues for safety  R: pt remains safe  

## 2018-04-12 NOTE — Progress Notes (Signed)
  D:Pt observed sleeping in bed with eyes closed. RR even and unlabored. No distress noted.Pt used the bathroom at 2300  A: 1:1  Close observation continues for safety  R: pt remains safe

## 2018-04-12 NOTE — Progress Notes (Signed)
  D:Pt observed sitting in dayroom watching TV. RR even and unlabored. No distress noted.Pt used the bathroom at 2300  A: 1:1 close  observation continues for safety  R: pt remains safe

## 2018-04-12 NOTE — Progress Notes (Signed)
Patient discharged to lobby. Patient was stable and appreciative at that time. All papers and prescriptions were given and valuables returned. Verbal understanding expressed. Denies SI/HI and A/VH. Patient given opportunity to express concerns and ask questions.  Suicide prevention talk line leaflet explained and given to patient upon discharge.

## 2018-04-12 NOTE — Progress Notes (Signed)
Recreation Therapy Notes  Date: 11.26.19 Time: 1000 Location: 500 Hall Dayroom  Group Topic: Leisure Education  Goal Area(s) Addresses:  Patient will identify positive leisure activities.  Patient will identify one positive benefit of participation in leisure activities.   Behavioral Response: Engaged  Intervention: Leisure Group Game  Activity: 5 Second Barrister's clerkule Game.  LRT pulled Martin card from the deck and ask patients the question on the card.  Once question was read, LRT would flip over the timer.  Patients had five seconds to give as many answers as possible.    Education:  Leisure Education, Building control surveyorDischarge Planning  Education Outcome: Acknowledges education/In group clarification offered/Needs additional education  Clinical Observations/Feedback: Patient was engaged with the activity.  Pt some of her answers didn't match the questions but she was active.  Pt was bright and social with peers during activity.    Annette RancherMarjette Kimari Martin, LRT/CTRS      Annette AbedLindsay, Annette Martin 04/12/2018 11:11 AM

## 2018-04-12 NOTE — Discharge Summary (Signed)
Physician Discharge Summary Note  Patient:  Annette Martin is an 62 y.o., female  MRN:  [id number]  DOB:  04/23/1956  Patient phone:  [phone] (home)   Patient address:   [address] [place] [address],   Total Time spent with patient: Greater than 30 minutes  Date of Admission:  04/05/2018  Date of Discharge: 04-12-18  Reason for Admission: Worsening depression, auditory hallucinations, & suicidal/homicidal thoughts.   Principal Problem: Schizophrenia [facility])  Discharge Diagnoses: Principal Problem:   Schizophrenia [facility])  Past Psychiatric History: Schizophrenia  Past Medical History:  Past Medical History:  Diagnosis Date  . Depression   . Diabetes mellitus   . GERD (gastroesophageal reflux disease)   . Mental retardation   . Thyroid disease   . Vitamin D deficient osteomalacia     Past Surgical History:  Procedure Laterality Date  . COLONOSCOPY  2010   MANN   Family History: History reviewed. No pertinent family history.  Family Psychiatric  History: See H&P.  Social History:  Social History   Substance and Sexual Activity  Alcohol Use No     Social History   Substance and Sexual Activity  Drug Use No    Social History   Socioeconomic History  . Marital status: Single    Spouse name: Not on file  . Number of children: Not on file  . Years of education: Not on file  . Highest education level: Not on file  Occupational History  . Not on file  Social Needs  . Financial resource strain: Not on file  . Food insecurity:    Worry: Not on file    Inability: Not on file  . Transportation needs:    Medical: Not on file    Non-medical: Not on file  Tobacco Use  . Smoking status: Never Smoker  . Smokeless tobacco: Never Used  Substance and Sexual Activity  . Alcohol use: No  . Drug use: No  . Sexual activity: Not Currently  Lifestyle  . Physical activity:    Days per week: Not on file    Minutes per session: Not on file  .  Stress: Not on file  Relationships  . Social connections:    Talks on phone: Not on file    Gets together: Not on file    Attends religious service: Not on file    Active member of club or organization: Not on file    Attends meetings of clubs or organizations: Not on file    Relationship status: Not on file  Other Topics Concern  . Not on file  Social History Narrative  . Not on file   Hospital Course: (Per Md's admission evaluation): Annette Martin is 62 years of age admitted to the [facility] system, she acknowledges depression, reported auditory hallucinations, and suicidal thoughts.  She has an extensive psychiatric history and numerous prior admissions. She is a resident at [facility] assisted living facility & she is expected to go back there upon discharge.  At baseline she does use a wheelchair due to generalized unsteadiness. Patient reported not only suicidal but homicidal thoughts on her initial screening she denies homicidal thoughts to me just stating she is a thoughts of overdosing on pills, although had thoughts of stabbing other residents she has had extensive treatment has 5 prior attempts at harming herself she states she is never harmed others in the past.  She states she feels safe when someone is watching her but does not want  to be unsupervised at the present time.  She has marked akathisia she is on Haldol at baseline.  Annette Martin was admitted to the [facility] adult unit for worsening symptoms of Schizophrenia  & depression. He presented to the hospital psychotic (having auditory hallucinations & suicidal ideations). She has hx of of extensive & multiple psychiatric admissions in the past. She is residing in an ALF. She was in need of mood stabilization treatment.  After evaluation of her presenting symptoms, the medication regimen targeting those symptoms were discussed & initiated. She received & was stabilized on; Cogentin 0.5 mg for EPS, Klonopin 0.5 mg for severe anxiety,  Gabapentin 300 mg for agitation/diabetic neuropathy, Restoril 30 mg for insomnia, Risperdal 2 mg daily, Risperdal 3 mg at bedtime & Valbenazine 80 mg for Tardive dyskinesia. She was oriented to the unit and encouraged to participate in the unit programming. She presented other significant pre-existing medical problems that required treatments. She was resumed & discharged on all her pertinent home medications for those health issues. She tolerated her treatment regimen without any adverse effects or reactions reported. She was enrolled & participated in the group counseling sessions being offered & held on this unit for coping skills. She was on 1:1 supervision for safety for fall risks due to lower extremity weakness & suicidal threats.  During the course of her hospital stay, Annette Martin was evaluated daily by a clinical provider to ascertain her response to her treatment regimen. As the days go by, improvement was noted as evidenced by her reports of decreasing symptoms, improved, mood, affect & participation in the unit programming. She was required on daily basis with assistance to complete a self-inventory asssessment noting mood, mental status, any new symptoms, anxiety or concerns. Her symptoms responded well to her treatment regimen, being in a therapeutic and supportive environment also assisted in his mood stability.   On this day of her hospital discharge, Annette Martin was in much improved condition than upon admission. Her symptoms were reported as significantly decreased or resolved completely. Upon discharge, she denies SI/HI and voiced no AVH. She was motivated to continue taking medication with a goal of continued improvement in mental health. She is discharged to follow-up mental health care & medication management on an outpatient basis as noted below. She is provided with all the necessary information needed to make this appointment without problems. She left [facility] in no apparent distress with all  personal belongings.  Physical Findings: AIMS: Facial and Oral Movements Muscles of Facial Expression: None, normal Lips and Perioral Area: None, normal Jaw: None, normal Tongue: None, normal,Extremity Movements Upper (arms, wrists, hands, fingers): None, normal Lower (legs, knees, ankles, toes): None, normal, Trunk Movements Neck, shoulders, hips: None, normal, Overall Severity Severity of abnormal movements (highest score from questions above): None, normal Incapacitation due to abnormal movements: None, normal Patient's awareness of abnormal movements (rate only patient's report): No Awareness, Dental Status Current problems with teeth and/or dentures?: No Does patient usually wear dentures?: No  CIWA:  CIWA-Ar Total: 0 COWS:  COWS Total Score: 2  Musculoskeletal: Strength & Muscle Tone: decreased Gait & Station: unsteady Patient leans: N/A  Psychiatric Specialty Exam: Physical Exam  Nursing note and vitals reviewed.   Review of Systems  Constitutional: Negative.   HENT: Negative.   Eyes: Negative.   Respiratory: Negative.  Negative for cough and shortness of breath.   Cardiovascular: Negative for chest pain and palpitations.       Hx. HTN  Gastrointestinal: Negative.  Negative for abdominal  pain, heartburn, nausea and vomiting.  Musculoskeletal: Falls: Risks for fall.  Skin: Negative.   Neurological: Negative.  Negative for dizziness.  Endo/Heme/Allergies: Negative.   Psychiatric/Behavioral: Positive for depression (Stable) and hallucinations (Hx. psychosis (stable)). Negative for memory loss (Hx. mental retardation), substance abuse and suicidal ideas. The patient has insomnia (Stable). The patient is not nervous/anxious (Stable).     Blood pressure (!) 153/94, pulse (!) 111, temperature 97.7 F (36.5 C), temperature source Oral, resp. rate 18, height 5\' 2"  (1.575 m), weight 81.6 kg.Body mass index is 32.92 kg/m.  See Md's discharge SRA   Have you used any form of  tobacco in the last 30 days? (Cigarettes, Smokeless Tobacco, Cigars, and/or Pipes): No  Has this patient used any form of tobacco in the last 30 days? (Cigarettes, Smokeless Tobacco, Cigars, and/or Pipes): N/A  Blood Alcohol level:  Lab Results  Component Value Date   [facility]  [facility]    <10        LOWEST DETECTABLE LIMIT FOR SERUM ALCOHOL IS 5 mg/dL FOR MEDICAL PURPOSES ONLY   ETH  11/06/2007    <5        LOWEST DETECTABLE LIMIT FOR SERUM ALCOHOL IS 11 mg/dL FOR MEDICAL PURPOSES ONLY   Metabolic Disorder Labs:  Lab Results  Component Value Date   HGBA1C 5.9 08/03/2012   MPG 114 05/28/2011   MPG 117 (H) 11/03/2010   No results found for: PROLACTIN Lab Results  Component Value Date   CHOL 162 08/03/2012   TRIG 177 (H) 08/03/2012   HDL 48 08/03/2012   CHOLHDL 3.4 08/03/2012   VLDL 35 08/03/2012   LDLCALC 79 08/03/2012   LDLCALC 51 05/28/2011   See Psychiatric Specialty Exam and Suicide Risk Assessment completed by Attending Physician prior to discharge.  Discharge destination:  Home  Is patient on multiple antipsychotic therapies at discharge:  No   Has Patient had three or more failed trials of antipsychotic monotherapy by history:  No  Recommended Plan for Multiple Antipsychotic Therapies: NA  Allergies as of 04/12/2018   No Known Allergies     Medication List    STOP taking these medications   haloperidol 5 MG tablet Commonly known as:  HALDOL     TAKE these medications     Indication  atorvastatin 20 MG tablet Commonly known as:  LIPITOR Take 1 tablet (20 mg total) by mouth daily.  Indication:  Heart Failure   benztropine 0.5 MG tablet Commonly known as:  COGENTIN Take 1 tablet (0.5 mg total) by mouth 2 (two) times daily. For prevention  Indication:  Extrapyramidal Reaction caused by Medications   bethanechol 25 MG tablet Commonly known as:  URECHOLINE Take 25 mg by mouth 3 (three) times daily.  Indication:  Distended Abdomen   clonazePAM 0.5  MG tablet Commonly known as:  KLONOPIN Take 1 tablet (0.5 mg total) by mouth 3 (three) times daily.  Indication:  Panic Disorder   gabapentin 300 MG capsule Commonly known as:  NEURONTIN Take 1 capsule (300 mg total) by mouth 3 (three) times daily. For agitation  Indication:  Diabetes with Nerve Disease, Agitation   hydrOXYzine 25 MG tablet Commonly known as:  ATARAX/VISTARIL Take 1 tablet (25 mg total) by mouth every 6 (six) hours as needed for anxiety.  Indication:  Feeling Anxious   lisinopril 20 MG tablet Commonly known as:  PRINIVIL,ZESTRIL Take 20 mg by mouth daily.  Indication:  High Blood Pressure Disorder   metFORMIN 1000 MG tablet Commonly  known as:  GLUCOPHAGE Take 1,000 mg by mouth 2 (two) times daily with a meal.  Indication:  Type 2 Diabetes   metoCLOPramide 5 MG tablet Commonly known as:  REGLAN Take 5 mg by mouth 3 (three) times daily before meals.  Indication:  Diabetic Gastroparesis   NUEDEXTA 20-10 MG Caps Generic drug:  Dextromethorphan-quiNIDine Take 1 capsule by mouth 2 (two) times daily.  Indication:  Pseudobulbar Affect   ondansetron 4 MG disintegrating tablet Commonly known as:  ZOFRAN-ODT Take 4 mg by mouth every 6 (six) hours as needed for nausea or vomiting.  Indication:  Nausea and Vomiting   pantoprazole 40 MG tablet Commonly known as:  PROTONIX Take 40 mg by mouth 2 (two) times daily.  Indication:  Esophagus Inflammation with Erosion   polyethylene glycol packet Commonly known as:  MIRALAX / GLYCOLAX Take 17 g by mouth daily.  Indication:  Constipation   risperidone 4 MG tablet Commonly known as:  RISPERDAL Take 1 tablet (4 mg total) by mouth at bedtime. For mood control  Indication:  Manic-Depression   temazepam 30 MG capsule Commonly known as:  RESTORIL Take 1 capsule (30 mg total) by mouth at bedtime as needed for sleep.  Indication:  Trouble Sleeping   tolterodine 4 MG 24 hr capsule Commonly known as:  DETROL LA Take 4  mg by mouth 2 (two) times daily.  Indication:  Overactive Bladder   TOVIAZ 4 MG Tb24 tablet Generic drug:  fesoterodine Take 4 mg by mouth at bedtime.  Indication:  Urinary Incontinence   Valbenazine Tosylate 80 MG Caps Take 1 tablet by mouth daily. For tardive dyskinesia  Indication:  Tardive Dyskinesia      Follow-up Information    [facility]. Go on 04/13/2018.   Specialty:  Assisted Living Facility Why:  at [facility] onsite appointments: for health care Consepcion Hearing, NP and for medications with Psych NP Carole Binning on 04/21/18. Contact information: [address] [place] [address] [phone]         Follow-up recommendations: Activity:  As tolerated Diet: As recommended by your primary care doctor. Keep all scheduled follow-up appointments as recommended.    Comments: Patient is instructed prior to discharge to: Take all medications as prescribed by his/her mental healthcare provider. Report any adverse effects and or reactions from the medicines to his/her outpatient provider promptly. Patient has been instructed & cautioned: To not engage in alcohol and or illegal drug use while on prescription medicines. In the event of worsening symptoms, patient is instructed to call the crisis hotline, 911 and or go to the nearest ED for appropriate evaluation and treatment of symptoms. To follow-up with his/her primary care provider for your other medical issues, concerns and or health care needs.   Signed: Armandina Stammer, NP, PMHNP, FNP-BC 04/12/2018, 1:06 PM

## 2019-02-13 DIAGNOSIS — I10 Essential (primary) hypertension: Secondary | ICD-10-CM

## 2019-02-13 DIAGNOSIS — G210 Malignant neuroleptic syndrome: Secondary | ICD-10-CM

## 2019-02-13 DIAGNOSIS — E1169 Type 2 diabetes mellitus with other specified complication: Secondary | ICD-10-CM

## 2019-02-13 DIAGNOSIS — R6250 Unspecified lack of expected normal physiological development in childhood: Secondary | ICD-10-CM

## 2019-02-13 DIAGNOSIS — E785 Hyperlipidemia, unspecified: Secondary | ICD-10-CM

## 2019-02-13 DIAGNOSIS — F209 Schizophrenia, unspecified: Secondary | ICD-10-CM

## 2019-02-13 DIAGNOSIS — M6282 Rhabdomyolysis: Secondary | ICD-10-CM

## 2019-02-13 DIAGNOSIS — D649 Anemia, unspecified: Secondary | ICD-10-CM

## 2019-02-13 DIAGNOSIS — R0902 Hypoxemia: Secondary | ICD-10-CM

## 2019-02-14 DIAGNOSIS — R0902 Hypoxemia: Secondary | ICD-10-CM | POA: Diagnosis not present

## 2019-02-14 DIAGNOSIS — G210 Malignant neuroleptic syndrome: Secondary | ICD-10-CM | POA: Diagnosis not present

## 2019-02-14 DIAGNOSIS — R6250 Unspecified lack of expected normal physiological development in childhood: Secondary | ICD-10-CM | POA: Diagnosis not present

## 2019-02-14 DIAGNOSIS — M6282 Rhabdomyolysis: Secondary | ICD-10-CM | POA: Diagnosis not present

## 2019-02-15 DIAGNOSIS — M6282 Rhabdomyolysis: Secondary | ICD-10-CM | POA: Diagnosis not present

## 2019-02-15 DIAGNOSIS — G210 Malignant neuroleptic syndrome: Secondary | ICD-10-CM | POA: Diagnosis not present

## 2019-02-15 DIAGNOSIS — R0902 Hypoxemia: Secondary | ICD-10-CM | POA: Diagnosis not present

## 2019-02-15 DIAGNOSIS — R6250 Unspecified lack of expected normal physiological development in childhood: Secondary | ICD-10-CM | POA: Diagnosis not present

## 2019-02-16 DIAGNOSIS — M6282 Rhabdomyolysis: Secondary | ICD-10-CM | POA: Diagnosis not present

## 2019-02-16 DIAGNOSIS — G210 Malignant neuroleptic syndrome: Secondary | ICD-10-CM | POA: Diagnosis not present

## 2019-02-16 DIAGNOSIS — R6250 Unspecified lack of expected normal physiological development in childhood: Secondary | ICD-10-CM | POA: Diagnosis not present

## 2019-02-16 DIAGNOSIS — R0902 Hypoxemia: Secondary | ICD-10-CM | POA: Diagnosis not present

## 2019-02-17 DIAGNOSIS — G210 Malignant neuroleptic syndrome: Secondary | ICD-10-CM | POA: Diagnosis not present

## 2019-02-17 DIAGNOSIS — R0902 Hypoxemia: Secondary | ICD-10-CM | POA: Diagnosis not present

## 2019-02-17 DIAGNOSIS — R6250 Unspecified lack of expected normal physiological development in childhood: Secondary | ICD-10-CM | POA: Diagnosis not present

## 2019-02-17 DIAGNOSIS — M6282 Rhabdomyolysis: Secondary | ICD-10-CM | POA: Diagnosis not present

## 2019-02-18 DIAGNOSIS — R0902 Hypoxemia: Secondary | ICD-10-CM | POA: Diagnosis not present

## 2019-02-18 DIAGNOSIS — M6282 Rhabdomyolysis: Secondary | ICD-10-CM | POA: Diagnosis not present

## 2019-02-18 DIAGNOSIS — R6250 Unspecified lack of expected normal physiological development in childhood: Secondary | ICD-10-CM | POA: Diagnosis not present

## 2019-02-18 DIAGNOSIS — G210 Malignant neuroleptic syndrome: Secondary | ICD-10-CM | POA: Diagnosis not present

## 2019-02-19 DIAGNOSIS — R0902 Hypoxemia: Secondary | ICD-10-CM | POA: Diagnosis not present

## 2019-02-19 DIAGNOSIS — G210 Malignant neuroleptic syndrome: Secondary | ICD-10-CM | POA: Diagnosis not present

## 2019-02-19 DIAGNOSIS — M6282 Rhabdomyolysis: Secondary | ICD-10-CM | POA: Diagnosis not present

## 2019-02-19 DIAGNOSIS — R6250 Unspecified lack of expected normal physiological development in childhood: Secondary | ICD-10-CM | POA: Diagnosis not present

## 2019-02-20 DIAGNOSIS — R0902 Hypoxemia: Secondary | ICD-10-CM | POA: Diagnosis not present

## 2019-02-20 DIAGNOSIS — G210 Malignant neuroleptic syndrome: Secondary | ICD-10-CM | POA: Diagnosis not present

## 2019-02-20 DIAGNOSIS — R6250 Unspecified lack of expected normal physiological development in childhood: Secondary | ICD-10-CM | POA: Diagnosis not present

## 2019-02-20 DIAGNOSIS — M6282 Rhabdomyolysis: Secondary | ICD-10-CM | POA: Diagnosis not present

## 2019-02-21 DIAGNOSIS — R6250 Unspecified lack of expected normal physiological development in childhood: Secondary | ICD-10-CM | POA: Diagnosis not present

## 2019-02-21 DIAGNOSIS — R0902 Hypoxemia: Secondary | ICD-10-CM | POA: Diagnosis not present

## 2019-02-21 DIAGNOSIS — M6282 Rhabdomyolysis: Secondary | ICD-10-CM | POA: Diagnosis not present

## 2019-02-21 DIAGNOSIS — G210 Malignant neuroleptic syndrome: Secondary | ICD-10-CM | POA: Diagnosis not present

## 2019-02-22 DIAGNOSIS — R6250 Unspecified lack of expected normal physiological development in childhood: Secondary | ICD-10-CM | POA: Diagnosis not present

## 2019-02-22 DIAGNOSIS — M6282 Rhabdomyolysis: Secondary | ICD-10-CM | POA: Diagnosis not present

## 2019-02-22 DIAGNOSIS — G210 Malignant neuroleptic syndrome: Secondary | ICD-10-CM | POA: Diagnosis not present

## 2019-02-22 DIAGNOSIS — R0902 Hypoxemia: Secondary | ICD-10-CM | POA: Diagnosis not present

## 2019-03-15 ENCOUNTER — Telehealth: Payer: Self-pay | Admitting: Neurology

## 2019-03-15 NOTE — Telephone Encounter (Signed)
FYI-Pt's brother called stating that the pt no longer lives with him and that we would need to call [facility] with the appt changes. [phone]

## 2019-03-15 NOTE — Telephone Encounter (Signed)
lvm to r/s 11/24 appt °

## 2019-04-06 ENCOUNTER — Encounter: Payer: Self-pay | Admitting: Neurology

## 2019-04-11 ENCOUNTER — Ambulatory Visit: Payer: Medicare PPO | Admitting: Neurology

## 2019-04-18 ENCOUNTER — Ambulatory Visit: Payer: Medicare PPO | Admitting: Neurology

## 2019-04-18 ENCOUNTER — Encounter: Payer: Self-pay | Admitting: Neurology

## 2019-04-24 NOTE — Progress Notes (Unsigned)
Patient no show for appointment on 04-18-2019. Please only reschedule if we have notes from primary care MD (  Either provided through PCP or through referring provider). I need to have access to last 12 month of lab results, medication history and past medical history .

## 2019-06-08 ENCOUNTER — Ambulatory Visit: Payer: Medicare PPO | Admitting: Neurology

## 2019-07-12 ENCOUNTER — Encounter: Payer: Self-pay | Admitting: *Deleted

## 2019-07-12 ENCOUNTER — Other Ambulatory Visit: Payer: Self-pay | Admitting: *Deleted

## 2019-07-17 ENCOUNTER — Encounter: Payer: Self-pay | Admitting: Diagnostic Neuroimaging

## 2019-07-17 ENCOUNTER — Ambulatory Visit: Payer: Medicare PPO | Admitting: Diagnostic Neuroimaging

## 2023-02-14 ENCOUNTER — Emergency Department (HOSPITAL_COMMUNITY): Payer: Medicare PPO

## 2023-02-14 ENCOUNTER — Encounter (HOSPITAL_COMMUNITY): Payer: Self-pay | Admitting: Emergency Medicine

## 2023-02-14 ENCOUNTER — Observation Stay (HOSPITAL_COMMUNITY)
Admission: EM | Admit: 2023-02-14 | Discharge: 2023-02-19 | Disposition: A | Discharge status: Skilled Nursing Facility | Payer: Medicare PPO | Attending: Infectious Diseases | Admitting: Infectious Diseases

## 2023-02-14 ENCOUNTER — Other Ambulatory Visit: Payer: Self-pay

## 2023-02-14 DIAGNOSIS — Z79899 Other long term (current) drug therapy: Secondary | ICD-10-CM | POA: Insufficient documentation

## 2023-02-14 DIAGNOSIS — D649 Anemia, unspecified: Principal | ICD-10-CM | POA: Insufficient documentation

## 2023-02-14 DIAGNOSIS — R531 Weakness: Secondary | ICD-10-CM | POA: Diagnosis present

## 2023-02-14 DIAGNOSIS — Z7984 Long term (current) use of oral hypoglycemic drugs: Secondary | ICD-10-CM | POA: Insufficient documentation

## 2023-02-14 DIAGNOSIS — R569 Unspecified convulsions: Secondary | ICD-10-CM | POA: Diagnosis not present

## 2023-02-14 DIAGNOSIS — E119 Type 2 diabetes mellitus without complications: Secondary | ICD-10-CM | POA: Insufficient documentation

## 2023-02-14 DIAGNOSIS — Z1152 Encounter for screening for COVID-19: Secondary | ICD-10-CM | POA: Insufficient documentation

## 2023-02-14 DIAGNOSIS — I10 Essential (primary) hypertension: Secondary | ICD-10-CM

## 2023-02-14 DIAGNOSIS — R29810 Facial weakness: Secondary | ICD-10-CM | POA: Diagnosis not present

## 2023-02-14 DIAGNOSIS — E1149 Type 2 diabetes mellitus with other diabetic neurological complication: Secondary | ICD-10-CM

## 2023-02-14 DIAGNOSIS — R4182 Altered mental status, unspecified: Secondary | ICD-10-CM | POA: Insufficient documentation

## 2023-02-14 DIAGNOSIS — M545 Low back pain, unspecified: Secondary | ICD-10-CM | POA: Insufficient documentation

## 2023-02-14 DIAGNOSIS — R251 Tremor, unspecified: Secondary | ICD-10-CM

## 2023-02-14 DIAGNOSIS — G9340 Encephalopathy, unspecified: Secondary | ICD-10-CM | POA: Diagnosis not present

## 2023-02-14 DIAGNOSIS — E039 Hypothyroidism, unspecified: Secondary | ICD-10-CM | POA: Diagnosis not present

## 2023-02-14 DIAGNOSIS — E785 Hyperlipidemia, unspecified: Secondary | ICD-10-CM | POA: Insufficient documentation

## 2023-02-14 DIAGNOSIS — R4781 Slurred speech: Secondary | ICD-10-CM | POA: Insufficient documentation

## 2023-02-14 DIAGNOSIS — E1169 Type 2 diabetes mellitus with other specified complication: Secondary | ICD-10-CM

## 2023-02-14 LAB — CBC WITH DIFFERENTIAL/PLATELET
Abs Immature Granulocytes: 0.02 10*3/uL (ref 0.00–0.07)
Basophils Absolute: 0 10*3/uL (ref 0.0–0.1)
Basophils Relative: 0 %
Eosinophils Absolute: 0.1 10*3/uL (ref 0.0–0.5)
Eosinophils Relative: 2 %
HCT: 32.5 % — ABNORMAL LOW (ref 36.0–46.0)
Hemoglobin: 9.4 g/dL — ABNORMAL LOW (ref 12.0–15.0)
Immature Granulocytes: 0 %
Lymphocytes Relative: 16 %
Lymphs Abs: 0.8 10*3/uL (ref 0.7–4.0)
MCH: 23.2 pg — ABNORMAL LOW (ref 26.0–34.0)
MCHC: 28.9 g/dL — ABNORMAL LOW (ref 30.0–36.0)
MCV: 80 fL (ref 80.0–100.0)
Monocytes Absolute: 0.5 10*3/uL (ref 0.1–1.0)
Monocytes Relative: 10 %
Neutro Abs: 3.7 10*3/uL (ref 1.7–7.7)
Neutrophils Relative %: 72 %
Platelets: 212 10*3/uL (ref 150–400)
RBC: 4.06 MIL/uL (ref 3.87–5.11)
RDW: 16 % — ABNORMAL HIGH (ref 11.5–15.5)
WBC: 5.1 10*3/uL (ref 4.0–10.5)
nRBC: 0 % (ref 0.0–0.2)

## 2023-02-14 LAB — PROTIME-INR
INR: 1 (ref 0.8–1.2)
Prothrombin Time: 13.3 s (ref 11.4–15.2)

## 2023-02-14 LAB — URINALYSIS, ROUTINE W REFLEX MICROSCOPIC
Bilirubin Urine: NEGATIVE
Glucose, UA: 150 mg/dL — AB
Hgb urine dipstick: NEGATIVE
Ketones, ur: 5 mg/dL — AB
Leukocytes,Ua: NEGATIVE
Nitrite: NEGATIVE
Protein, ur: NEGATIVE mg/dL
Specific Gravity, Urine: >1.046 — ABNORMAL HIGH (ref 1.005–1.030)
pH: 6 (ref 5.0–8.0)

## 2023-02-14 LAB — POC OCCULT BLOOD, ED: Fecal Occult Bld: NEGATIVE

## 2023-02-14 LAB — CBG MONITORING, ED
Glucose-Capillary: 264 mg/dL — ABNORMAL HIGH (ref 70–99)
Glucose-Capillary: 116 mg/dL — ABNORMAL HIGH (ref 70–99)
Glucose-Capillary: 125 mg/dL — ABNORMAL HIGH (ref 70–99)
Glucose-Capillary: 175 mg/dL — ABNORMAL HIGH (ref 70–99)

## 2023-02-14 LAB — I-STAT CHEM 8, ED
BUN: 19 mg/dL (ref 8–23)
BUN: 14 mg/dL (ref 8–23)
Calcium, Ion: 0.99 mmol/L — ABNORMAL LOW (ref 1.15–1.40)
Calcium, Ion: 1.08 mmol/L — ABNORMAL LOW (ref 1.15–1.40)
Chloride: 103 mmol/L (ref 98–111)
Chloride: 102 mmol/L (ref 98–111)
Creatinine, Ser: 0.8 mg/dL (ref 0.44–1.00)
Creatinine, Ser: 0.7 mg/dL (ref 0.44–1.00)
Glucose, Bld: 274 mg/dL — ABNORMAL HIGH (ref 70–99)
Glucose, Bld: 239 mg/dL — ABNORMAL HIGH (ref 70–99)
HCT: 30 % — ABNORMAL LOW (ref 36.0–46.0)
HCT: 30 % — ABNORMAL LOW (ref 36.0–46.0)
Hemoglobin: 10.2 g/dL — ABNORMAL LOW (ref 12.0–15.0)
Hemoglobin: 10.2 g/dL — ABNORMAL LOW (ref 12.0–15.0)
Potassium: 7.4 mmol/L (ref 3.5–5.1)
Potassium: 4.2 mmol/L (ref 3.5–5.1)
Sodium: 135 mmol/L (ref 135–145)
Sodium: 140 mmol/L (ref 135–145)
TCO2: 26 mmol/L (ref 22–32)
TCO2: 24 mmol/L (ref 22–32)

## 2023-02-14 LAB — IRON AND TIBC
Iron: 24 ug/dL — ABNORMAL LOW (ref 28–170)
Saturation Ratios: 5 % — ABNORMAL LOW (ref 10.4–31.8)
TIBC: 514 ug/dL — ABNORMAL HIGH (ref 250–450)
UIBC: 490 ug/dL

## 2023-02-14 LAB — ETHANOL: Alcohol, Ethyl (B): <10 mg/dL (ref <10)

## 2023-02-14 LAB — COMPREHENSIVE METABOLIC PANEL
ALT: 19 U/L (ref 0–44)
AST: 18 U/L (ref 15–41)
Albumin: 3.2 g/dL — ABNORMAL LOW (ref 3.5–5.0)
Alkaline Phosphatase: 86 U/L (ref 38–126)
Anion gap: 17 — ABNORMAL HIGH (ref 5–15)
BUN: 13 mg/dL (ref 8–23)
CO2: 23 mmol/L (ref 22–32)
Calcium: 9.1 mg/dL (ref 8.9–10.3)
Chloride: 98 mmol/L (ref 98–111)
Creatinine, Ser: 0.83 mg/dL (ref 0.44–1.00)
GFR, Estimated: >60 mL/min (ref >60)
Glucose, Bld: 235 mg/dL — ABNORMAL HIGH (ref 70–99)
Potassium: 4 mmol/L (ref 3.5–5.1)
Sodium: 138 mmol/L (ref 135–145)
Total Bilirubin: 0.3 mg/dL (ref 0.3–1.2)
Total Protein: 6.4 g/dL — ABNORMAL LOW (ref 6.5–8.1)

## 2023-02-14 LAB — HIV ANTIBODY (ROUTINE TESTING W REFLEX): HIV Screen 4th Generation wRfx: NONREACTIVE

## 2023-02-14 LAB — RETICULOCYTES
Immature Retic Fract: 21.7 % — ABNORMAL HIGH (ref 2.3–15.9)
RBC.: 4.15 MIL/uL (ref 3.87–5.11)
Retic Count, Absolute: 61 10*3/uL (ref 19.0–186.0)
Retic Ct Pct: 1.5 % (ref 0.4–3.1)

## 2023-02-14 LAB — CK: Total CK: 178 U/L (ref 38–234)

## 2023-02-14 LAB — FERRITIN: Ferritin: 8 ng/mL — ABNORMAL LOW (ref 11–307)

## 2023-02-14 LAB — VALPROIC ACID LEVEL: Valproic Acid Lvl: 36 ug/mL — ABNORMAL LOW (ref 50.0–100.0)

## 2023-02-14 LAB — VITAMIN B12: Vitamin B-12: 403 pg/mL (ref 180–914)

## 2023-02-14 LAB — SARS CORONAVIRUS 2 BY RT PCR: SARS Coronavirus 2 by RT PCR: NEGATIVE

## 2023-02-14 LAB — APTT: aPTT: 21 s — ABNORMAL LOW (ref 24–36)

## 2023-02-14 LAB — AMMONIA: Ammonia: 23 umol/L (ref 9–35)

## 2023-02-14 LAB — FOLATE: Folate: 7.4 ng/mL (ref >5.9)

## 2023-02-14 MED ORDER — FERROUS SULFATE 325 (65 FE) MG PO TABS
325.0000 mg | ORAL_TABLET | Freq: Every day | ORAL | Status: DC
  Administered 2023-02-15 – 2023-02-19 (×5): 325 mg via ORAL
  Filled 2023-02-14 (×5): qty 1

## 2023-02-14 MED ORDER — SODIUM CHLORIDE 0.9 % IV BOLUS
1000.0000 mL | Freq: Once | INTRAVENOUS | Status: AC
  Administered 2023-02-14: 1000 mL via INTRAVENOUS

## 2023-02-14 MED ORDER — ENOXAPARIN SODIUM 40 MG/0.4ML IJ SOSY
40.0000 mg | PREFILLED_SYRINGE | INTRAMUSCULAR | Status: DC
  Administered 2023-02-14 – 2023-02-18 (×5): 40 mg via SUBCUTANEOUS
  Filled 2023-02-14 (×5): qty 0.4

## 2023-02-14 MED ORDER — HALOPERIDOL 5 MG PO TABS: 7.5000 mg | ORAL_TABLET | Freq: Three times a day (TID) | ORAL | Status: DC

## 2023-02-14 MED ORDER — SODIUM CHLORIDE 0.9% FLUSH
3.0000 mL | Freq: Once | INTRAVENOUS | Status: AC
  Administered 2023-02-14: 3 mL via INTRAVENOUS

## 2023-02-14 MED ORDER — INSULIN ASPART 100 UNIT/ML IJ SOLN: 0.0000 [IU] | Freq: Every day | INTRAMUSCULAR | Status: DC

## 2023-02-14 MED ORDER — PANTOPRAZOLE SODIUM 40 MG PO TBEC
40.0000 mg | DELAYED_RELEASE_TABLET | Freq: Two times a day (BID) | ORAL | Status: DC
  Administered 2023-02-14 – 2023-02-19 (×10): 40 mg via ORAL
  Filled 2023-02-14 (×11): qty 1

## 2023-02-14 MED ORDER — SERTRALINE HCL 100 MG PO TABS
200.0000 mg | ORAL_TABLET | Freq: Every day | ORAL | Status: DC
  Administered 2023-02-15 – 2023-02-19 (×5): 200 mg via ORAL
  Filled 2023-02-14 (×5): qty 2

## 2023-02-14 MED ORDER — ACETAMINOPHEN 650 MG RE SUPP: 650.0000 mg | Freq: Four times a day (QID) | RECTAL | Status: DC | PRN

## 2023-02-14 MED ORDER — IOHEXOL 350 MG/ML SOLN
100.0000 mL | Freq: Once | INTRAVENOUS | Status: AC | PRN
  Administered 2023-02-14: 100 mL via INTRAVENOUS

## 2023-02-14 MED ORDER — ACETAMINOPHEN 325 MG PO TABS
650.0000 mg | ORAL_TABLET | Freq: Four times a day (QID) | ORAL | Status: DC | PRN
  Administered 2023-02-15 – 2023-02-19 (×5): 650 mg via ORAL
  Filled 2023-02-14 (×5): qty 2

## 2023-02-14 MED ORDER — DIVALPROEX SODIUM 250 MG PO DR TAB
250.0000 mg | DELAYED_RELEASE_TABLET | Freq: Three times a day (TID) | ORAL | Status: DC
  Administered 2023-02-14 – 2023-02-19 (×15): 250 mg via ORAL
  Filled 2023-02-14 (×15): qty 1

## 2023-02-14 MED ORDER — SENNOSIDES-DOCUSATE SODIUM 8.6-50 MG PO TABS
1.0000 | ORAL_TABLET | Freq: Every evening | ORAL | Status: DC | PRN
  Filled 2023-02-14: qty 1

## 2023-02-14 MED ORDER — INSULIN ASPART 100 UNIT/ML IJ SOLN
0.0000 [IU] | Freq: Three times a day (TID) | INTRAMUSCULAR | Status: DC
  Administered 2023-02-15: 2 [IU] via SUBCUTANEOUS
  Administered 2023-02-15: 3 [IU] via SUBCUTANEOUS
  Administered 2023-02-15: 2 [IU] via SUBCUTANEOUS
  Administered 2023-02-16 – 2023-02-19 (×9): 3 [IU] via SUBCUTANEOUS

## 2023-02-14 NOTE — ED Notes (Signed)
Pt back from MRI at this time. Pt placed back on cardiac monitor

## 2023-02-14 NOTE — Code Documentation (Signed)
Stroke Response Nurse Documentation Code Documentation  CORETHA CRESWELL is a 67 y.o. female arriving to [facility]  via [facility] on 02/14/23 with past medical hx of MR, schizophrenia, GERD, neuroleptic malignant syndrome, rhabdomylosis, and HTN. On No antithrombotic. Code stroke was activated by EMS.   Patient from SNF where she was LKW at 1800 yesterday 02/13/23 and now complaining of Left sided weakness, facial droop and slurred speech. Pt from [facility] where she was last seen normal from staff at 1800 yesterday. She is now c/o L sided weakness, facial droop and slurred speech.   Stroke team at the bedside . Labs drawn and patient cleared for CT. Patient to CT with team. NIHSS 7, see documentation for details and code stroke times. The following imaging was completed:  CT Head, CTA, and CTP. Patient is not a candidate for IV Thrombolytic due to outside window. Patient is not a candidate for IR due to no LVO on imaging.   Care Plan: q2h neuro checks and vital signs.   Bedside handoff with ED RN Seward Grater.    Mordecai Rasmussen  Stroke Response RN

## 2023-02-14 NOTE — ED Triage Notes (Signed)
Pt arrives via [facility] from [facility] with LSN 1800 last night. Pt with facial droop, left sided weakness and lethargy.

## 2023-02-14 NOTE — Progress Notes (Signed)
Spoke with RN via secure chat. Pt is headed to MRI will try back for EEG later.

## 2023-02-14 NOTE — Consult Note (Addendum)
Stroke Neurology Consultation Note  Consult Requested by: Ginnie Smart, MD  Reason for Consult: Stroke  Consult Date: 02/14/23   The history was obtained from the pt and EMS.  During history and examination.   History of Present Illness:  Annette Martin is a 67 y.o. Caucasian female with PMH of MR, schizophrenia, GERD, neuroleptic malignant syndrome, rhabdomylosis, and HTN presenting with left sided weakness and AMS.   She did receive some of her medications this morning but not all of them.  She does have a cough.  She is from [facility].   LSN: 1800 last night. tPA Given: No: out of window.  mRS:   Past Medical History:  Diagnosis Date   Depression    Diabetes mellitus    type 2   GERD (gastroesophageal reflux disease)    Hypertension    Hypoxemia    Mental retardation    Neuroleptic malignant syndrome    Rhabdomyolysis    Schizophrenia (HCC)    Thyroid disease    Vitamin D deficient osteomalacia     Past Surgical History:  Procedure Laterality Date   COLONOSCOPY  2010   MANN    History reviewed. No pertinent family history.  Social History:  reports that she has never smoked. She has never used smokeless tobacco. She reports that she does not drink alcohol and does not use drugs.  Allergies: No Known Allergies  No current facility-administered medications on file prior to encounter.   Current Outpatient Medications on File Prior to Encounter  Medication Sig Dispense Refill   amantadine (SYMMETREL) 100 MG capsule Take 100 mg by mouth 2 (two) times daily.     atorvastatin (LIPITOR) 10 MG tablet Take 10 mg by mouth at bedtime.     bethanechol (URECHOLINE) 25 MG tablet Take 25 mg by mouth 3 (three) times daily.     clonazePAM (KLONOPIN) 0.5 MG tablet Take 1 tablet (0.5 mg total) by mouth 3 (three) times daily. For anxiety 15 tablet 0   Dextromethorphan-quiNIDine (NUEDEXTA) 20-10 MG CAPS Take 1 capsule by mouth 2 (two) times daily.     divalproex  (DEPAKOTE) 250 MG DR tablet Take 250 mg by mouth 3 (three) times daily.     docusate sodium (COLACE) 100 MG capsule Take 200 mg by mouth at bedtime.     fesoterodine (TOVIAZ) 4 MG TB24 tablet Take 4 mg by mouth every evening.     gabapentin (NEURONTIN) 300 MG capsule Take 300 mg by mouth 3 (three) times daily.     haloperidol (HALDOL) 5 MG tablet Take 7.5 mg by mouth 3 (three) times daily.     haloperidol (HALDOL) 5 MG tablet Take 5 mg by mouth every 12 (twelve) hours as needed for agitation.     lactulose (CHRONULAC) 10 GM/15ML solution Take 20 g by mouth daily.     magnesium oxide (MAG-OX) 400 (240 Mg) MG tablet Take 400 mg by mouth 2 (two) times daily. Lunch and dinner     meclizine (ANTIVERT) 25 MG tablet Take 25 mg by mouth 3 (three) times daily as needed for dizziness.     meloxicam (MOBIC) 7.5 MG tablet Take 7.5 mg by mouth daily.     metFORMIN (GLUCOPHAGE) 1000 MG tablet Take 500 mg by mouth 2 (two) times daily with a meal.     ondansetron (ZOFRAN) 4 MG tablet Take 4 mg by mouth every 6 (six) hours as needed for nausea or vomiting.     pantoprazole (PROTONIX) 40  MG tablet Take 40 mg by mouth 2 (two) times daily.     sertraline (ZOLOFT) 100 MG tablet Take 200 mg by mouth daily.      Review of Systems: A full ROS was attempted today and was able to be performed.  Systems assessed include - Constitutional, Eyes, HENT, Respiratory, Cardiovascular, Gastrointestinal, Genitourinary, Integument/breast, Hematologic/lymphatic, Musculoskeletal, Neurological, Behavioral/Psych, Endocrine, Allergic/Immunologic - with pertinent responses as per HPI.  Physical Examination: Temp:  [95.6 F (35.3 C)-97.5 F (36.4 C)] 97.5 F (36.4 C) (09/29 1545) Pulse Rate:  [84-90] 88 (09/29 1545) Resp:  [13-22] 19 (09/29 1545) BP: (126-178)/(49-96) 140/61 (09/29 1530) SpO2:  [98 %-100 %] 100 % (09/29 1545) Weight:  [99.4 kg] 99.4 kg (09/29 0959)  General - well nourished, well developed, in no apparent  distress.  On exam she is drowsy, needs repeated stimulation to awaken.   Ophthalmologic - fundi not visualized due to noncooperation.    Cardiovascular - regular rhythm and rate  Mental Status -  Drowsy, oriented to self, incorrect age.  Language including expression, naming, repetition, comprehension, reading, and writing was assessed and found intact. Poor attention span and concentration  Cranial Nerves II - XII - II - Vision intact OU. III, IV, VI - Extraocular movements intact. V - Facial sensation is intact VII - Slight facial asymmetry at rest, equal movement with smiling  VIII - Hearing & vestibular intact bilaterally. X - Palate elevates symmetrically. XI - Chin turning & shoulder shrug intact bilaterally. XII - Tongue protrusion intact.  Motor Strength - Mild drift on the left. Hand grasp equal   Motor Tone & Bulk - Muscle tone was assessed at the neck and appendages and was normal.  Bulk was normal and fasciculations were absent.   Reflexes - The patient's reflexes were normal in all extremities and she had no pathological reflexes.  Sensory - Light touch, temperature/pinprick were assessed and were normal.  Endorses pain in bilateral upper extremities  Coordination - The patient had normal movements in the hands and feet with no ataxia or dysmetria.  Tremor was absent.  Gait and Station - deferred  1a Level of Conscious.: 1 1b LOC Questions: 2 1c LOC Commands: 0 2 Best Gaze: 0 3 Visual: 0 4 Facial Palsy: 1 5a Motor Arm - left: 1 5b Motor Arm - Right: 0 6a Motor Leg - Left: 1 6b Motor Leg - Right: 0 7 Limb Ataxia: 0 8 Sensory: 0 9 Best Language: 0 10 Dysarthria: 1 11 Extinct. and Inatten.: 0 TOTAL: 7  Data Reviewed: MR BRAIN WO CONTRAST  Result Date: 02/14/2023 CLINICAL DATA:  67 year old female code stroke presentation this morning with facial droop, left side weakness, lethargy. Last seen normal 1800 hours. EXAM: MRI HEAD WITHOUT CONTRAST TECHNIQUE:  Multiplanar, multiecho pulse sequences of the brain and surrounding structures were obtained without intravenous contrast. COMPARISON:  CT head, CTA, CTP today.  Brain MRI 10/01/2022. FINDINGS: Brain: No restricted diffusion or evidence of acute infarction. Chronic nonspecific ventriculomegaly is stable since May. No transependymal edema. There is some evidence of hyperdynamic flow at the cerebral aqueduct. No midline shift, mass effect, evidence of mass lesion, extra-axial collection or acute intracranial hemorrhage. Cervicomedullary junction and pituitary are within normal limits. Stable gray and white matter signal throughout the brain. Scattered mild for age subcortical white matter T2 and FLAIR hyperintensity in a nonspecific configuration. No cortical encephalomalacia or chronic cerebral blood products identified. Vascular: Major intracranial vascular flow voids are stable. Skull and upper cervical spine:  Negative. Visualized bone marrow signal is within normal limits. Sinuses/Orbits: Stable, negative. Other: Visible internal auditory structures appear normal. Negative visible scalp and face. IMPRESSION: 1. No acute intracranial abnormality. 2. Chronic nonspecific ventriculomegaly. Consider NPH in the appropriate clinical setting. 3. Mild for age nonspecific white matter signal changes. Electronically Signed   By: Odessa Fleming M.D.   On: 02/14/2023 12:07   DG Chest Portable 1 View  Result Date: 02/14/2023 CLINICAL DATA:  Altered mental status EXAM: PORTABLE CHEST - 1 VIEW COMPARISON:  12/21/2022 FINDINGS: Mild coarse interstitial opacities in the lung bases. Heart size and mediastinal contours are within normal limits. No effusion. Visualized bones unremarkable. IMPRESSION: Mild bibasilar interstitial opacities. Electronically Signed   By: Corlis Leak M.D.   On: 02/14/2023 10:59   CT ANGIO HEAD NECK W WO CM W PERF (CODE STROKE)  Result Date: 02/14/2023 CLINICAL DATA:  67 year old female code stroke  presentation. Altered mental status and slurred speech. EXAM: CT ANGIOGRAPHY HEAD AND NECK CT PERFUSION BRAIN TECHNIQUE: Multidetector CT imaging of the head and neck was performed using the standard protocol during bolus administration of intravenous contrast. Multiplanar CT image reconstructions and MIPs were obtained to evaluate the vascular anatomy. Carotid stenosis measurements (when applicable) are obtained utilizing NASCET criteria, using the distal internal carotid diameter as the denominator. Multiphase CT imaging of the brain was performed following IV bolus contrast injection. Subsequent parametric perfusion maps were calculated using RAPID software. RADIATION DOSE REDUCTION: This exam was performed according to the departmental dose-optimization program which includes automated exposure control, adjustment of the mA and/or kV according to patient size and/or use of iterative reconstruction technique. CONTRAST:  OMNIPAQUE IOHEXOL 350 MG/ML SOLN COMPARISON:  Head CT 0954 hours today.  Brain MRI 10/01/2022. FINDINGS: CT Brain Perfusion Findings: ASPECTS: 10 CBF (<30%) Volume: 0mL.  No parameter abnormality. Perfusion (Tmax>6.0s) volume: 0mL.  No parameter abnormality. Mismatch Volume: Not applicable Infarction Location:Not applicable CTA NECK Skeleton: Right TMJ degeneration. Mild for age cervical spine degeneration. No acute osseous abnormality identified. Upper chest: Mild respiratory motion, otherwise negative. Other neck: No acute finding. Aortic arch: Mildly bovine arch configuration. No arch atherosclerosis. Right carotid system: Negative brachiocephalic artery. Tortuous proximal right CCA. Negative right carotid bifurcation. Tortuous cervical right ICA but no plaque or stenosis. Left carotid system: Mildly tortuous left CCA. Negative left carotid bifurcation. No plaque or stenosis. Vertebral arteries: Proximal right subclavian artery is normal. The right vertebral artery has a shared origin  with the thyrocervical trunk (series 9, image 273. Right vertebral artery is patent and within normal limits to the skull base. Normal proximal left subclavian artery. Normal left vertebral artery origin. Dominant left vertebral artery. Tortuous left V1 segment. Left vertebral artery is patent and normal to the skull base. CTA HEAD Posterior circulation: Patent distal vertebral arteries and vertebrobasilar junction with no plaque or stenosis. Dominant left V4 segment. Normal PICA origins. Patent basilar artery without stenosis. Normal SCA and PCA origins. Small posterior communicating arteries. Bilateral PCA branches are within normal limits, up to mild P2 segment irregularity. Anterior circulation: Both ICA siphons are patent. Minimal if any ICA siphon plaque and no stenosis. Normal posterior communicating artery origins. Patent carotid termini. Normal MCA and ACA origins. Mild A1 tortuosity. Normal anterior communicating artery and bilateral ACA branches. Left MCA M1 segment bifurcates early without stenosis. Mild crossing venous vessel artifact at the left MCA bifurcation. Right MCA M1 segment and bifurcation are patent without stenosis. Bilateral MCA branches are within normal limits. Venous  sinuses: Patent. Anatomic variants: Dominant left vertebral artery. Review of the MIP images confirms the above findings IMPRESSION: Negative CT Perfusion. And CTA is negative for LVO, atherosclerosis, or stenosis. Mild arterial tortuosity in the head and neck. These results were communicated to Dr. Lovett Sox at 10:28 am on 02/14/2023 by text page via the [facility] messaging system. Electronically Signed   By: Odessa Fleming M.D.   On: 02/14/2023 10:29   CT HEAD CODE STROKE WO CONTRAST  Result Date: 02/14/2023 CLINICAL DATA:  Code stroke. 67 year old female neurologic deficit (not otherwise specified at the time of this report). EXAM: CT HEAD WITHOUT CONTRAST TECHNIQUE: Contiguous axial images were obtained from the base of the  skull through the vertex without intravenous contrast. RADIATION DOSE REDUCTION: This exam was performed according to the departmental dose-optimization program which includes automated exposure control, adjustment of the mA and/or kV according to patient size and/or use of iterative reconstruction technique. COMPARISON:  Brain MRI 10/01/2022 head CT 12/23/2022. FINDINGS: Brain: Stable cerebral volume. Stable nonspecific ventriculomegaly. No evidence of transependymal edema. No midline shift, mass effect, evidence of mass lesion, intracranial hemorrhage or evidence of cortically based acute infarction. Vascular: No suspicious intracranial vascular hyperdensity. Skull: Osteopenia.  No acute osseous abnormality identified. Sinuses/Orbits: Visualized paranasal sinuses and mastoids are stable and well aerated. Other: No gaze deviation, acute orbit or scalp soft tissue finding. ASPECTS [facility] Stroke Program Early CT Score) Total score (0-10 with 10 being normal): 10 IMPRESSION: 1. No acute cortically based infarct or acute intracranial hemorrhage identified. ASPECTS 10. 2. Stable chronic nonspecific ventriculomegaly. 3. These results were communicated to Dr. Viviann Spare at 9:58 am on 02/14/2023 by text page via the [facility] messaging system. Electronically Signed   By: Odessa Fleming M.D.   On: 02/14/2023 09:59    Assessment: 67 y.o. female presenting with altered mental status and mild left-sided weakness.  MRI is negative for any acute infarct.  Chest x-ray shows mild bibasilar interstitial opacities and UA is negative.  CT Head- No acute cortically based infarct or acute intracranial hemorrhage identified. ASPECTS 10. Stable chronic nonspecific ventriculomegaly. CT Angio Head and Neck- Negative CT Perfusion. And CTA is negative for LVO, atherosclerosis, or stenosis. Mild arterial tortuosity in the head and neck. MRI-negative on personal review  Plan: -EEG -Infectious workup per ED -[facility] patient appointment with [facility] scheduled for April 05, 2023  Thank you for this consultation and allowing [place] to participate in the care of this patient.   ATTENDING ATTESTATION:  67 y/o with psych history on neuropsych meds with possible missed meds. Likely either metabolic/infectious or medication related encephalopathy.  MRI neg for stroke. Recommend metabolic/infectious workup. Consider psych consult.    Code stroke cancelled.   EEG is neg, Neurology will sign off.   Dr. Viviann Spare evaluated pt independently, reviewed imaging, chart, labs. Discussed and formulated plan with the Resident/APP. Changes were made to the note where appropriate. Please see APP/resident note above for details.     MDM: High. Pertinent labs, imaging results reviewed by me and considered in my decision making. Independently reviewed imaging. Medical records reviewed. Discussed the patient with another medical provider/personnel. Obtained history from someone other than the patient.      Mckell Riecke,MD

## 2023-02-14 NOTE — Progress Notes (Signed)
EEG complete - results pending 

## 2023-02-14 NOTE — ED Notes (Signed)
PT is crying intermittently and screaming out "help me"

## 2023-02-14 NOTE — H&P (Incomplete)
Internal Medicine Teaching Service Attending Note Date: 02/14/2023  Patient name: Annette Martin  Medical record number: [id number]  Date of birth: 05/05/1956   I have seen and evaluated Annette Martin and discussed their care with the Residency Team.   67 yo F with hx of Schizophrenia, DM2, hyperlipidemia, brought to ED from [facility] with hx of falling from bed, c/o weakness, facial droop, slurred speech. In ED she complains of headache, L facial numbness. CT head and CTA head both negative.  In ED she underwent MRI head-  1. No acute intracranial abnormality. 2. Chronic nonspecific ventriculomegaly. Consider NPH in the appropriate clinical setting. 3. Mild for age nonspecific white matter signal changes. She denies any changes in bowels or urine.   Physical Exam: Blood pressure (!) 149/69, pulse 84, temperature (!) 97.2 F (36.2 C), temperature source Oral, resp. rate 17, height 5\' 2"  (1.575 m), weight 99.4 kg, SpO2 100%. General appearance: fatigued, no distress, and morbidly obese Eyes: negative findings: pupils equal, round, reactive to light and accomodation Throat: normal findings: oropharynx pink & moist without lesions or evidence of thrush Neck: supple, symmetrical, trachea midline Cardio: regular rate and rhythm GI: normal findings: bowel sounds normal, soft, non-tender, and states she is tender on exam but this is not reproducible.  Extremities: edema none and 3+ dorsalis pedis pulses Neuro- decreased tone on left but some limitation from effort.   Lab results: Results for orders placed or performed during the hospital encounter of 02/14/23 (from the past 24 hour(s))  CBG monitoring, ED     Status: Abnormal   Collection Time: 02/14/23  9:41 AM  Result Value Ref Range   Glucose-Capillary 264 (H) 70 - 99 mg/dL   Comment 1 Notify RN    Comment 2 Document in Chart   Ethanol     Status: None   Collection Time: 02/14/23 10:03 AM  Result Value Ref Range   Alcohol, Ethyl (B)  <10 <10 mg/dL  I-stat chem 8, ED     Status: Abnormal   Collection Time: 02/14/23 10:04 AM  Result Value Ref Range   Sodium 135 135 - 145 mmol/L   Potassium 7.4 (HH) 3.5 - 5.1 mmol/L   Chloride 103 98 - 111 mmol/L   BUN 19 8 - 23 mg/dL   Creatinine, Ser 4.09 0.44 - 1.00 mg/dL   Glucose, Bld 811 (H) 70 - 99 mg/dL   Calcium, Ion 9.14 (L) 1.15 - 1.40 mmol/L   TCO2 26 22 - 32 mmol/L   Hemoglobin 10.2 (L) 12.0 - 15.0 g/dL   HCT 78.2 (L) 95.6 - 21.3 %   Comment NOTIFIED PHYSICIAN   SARS Coronavirus 2 by RT PCR (hospital order, performed in [facility] hospital lab) *cepheid single result test* Anterior Nasal Swab     Status: None   Collection Time: 02/14/23 10:25 AM   Specimen: Anterior Nasal Swab  Result Value Ref Range   SARS Coronavirus 2 by RT PCR NEGATIVE NEGATIVE  Urinalysis, Routine w reflex microscopic -Urine, Clean Catch     Status: Abnormal   Collection Time: 02/14/23 10:31 AM  Result Value Ref Range   Color, Urine YELLOW YELLOW   APPearance CLEAR CLEAR   Specific Gravity, Urine >1.046 (H) 1.005 - 1.030   pH 6.0 5.0 - 8.0   Glucose, UA 150 (A) NEGATIVE mg/dL   Hgb urine dipstick NEGATIVE NEGATIVE   Bilirubin Urine NEGATIVE NEGATIVE   Ketones, ur 5 (A) NEGATIVE mg/dL   Protein, ur NEGATIVE NEGATIVE  mg/dL   Nitrite NEGATIVE NEGATIVE   Leukocytes,Ua NEGATIVE NEGATIVE  CK     Status: None   Collection Time: 02/14/23 10:31 AM  Result Value Ref Range   Total CK 178 38 - 234 U/L  Protime-INR     Status: None   Collection Time: 02/14/23 10:31 AM  Result Value Ref Range   Prothrombin Time 13.3 11.4 - 15.2 seconds   INR 1.0 0.8 - 1.2  APTT     Status: Abnormal   Collection Time: 02/14/23 10:31 AM  Result Value Ref Range   aPTT 21 (L) 24 - 36 seconds  CBC with Differential/Platelet     Status: Abnormal   Collection Time: 02/14/23 10:31 AM  Result Value Ref Range   WBC 5.1 4.0 - 10.5 K/uL   RBC 4.06 3.87 - 5.11 MIL/uL   Hemoglobin 9.4 (L) 12.0 - 15.0 g/dL   HCT 47.4 (L)  25.9 - 46.0 %   MCV 80.0 80.0 - 100.0 fL   MCH 23.2 (L) 26.0 - 34.0 pg   MCHC 28.9 (L) 30.0 - 36.0 g/dL   RDW 56.3 (H) 87.5 - 64.3 %   Platelets 212 150 - 400 K/uL   nRBC 0.0 0.0 - 0.2 %   Neutrophils Relative % 72 %   Neutro Abs 3.7 1.7 - 7.7 K/uL   Lymphocytes Relative 16 %   Lymphs Abs 0.8 0.7 - 4.0 K/uL   Monocytes Relative 10 %   Monocytes Absolute 0.5 0.1 - 1.0 K/uL   Eosinophils Relative 2 %   Eosinophils Absolute 0.1 0.0 - 0.5 K/uL   Basophils Relative 0 %   Basophils Absolute 0.0 0.0 - 0.1 K/uL   Immature Granulocytes 0 %   Abs Immature Granulocytes 0.02 0.00 - 0.07 K/uL  Comprehensive metabolic panel     Status: Abnormal   Collection Time: 02/14/23 10:31 AM  Result Value Ref Range   Sodium 138 135 - 145 mmol/L   Potassium 4.0 3.5 - 5.1 mmol/L   Chloride 98 98 - 111 mmol/L   CO2 23 22 - 32 mmol/L   Glucose, Bld 235 (H) 70 - 99 mg/dL   BUN 13 8 - 23 mg/dL   Creatinine, Ser 3.29 0.44 - 1.00 mg/dL   Calcium 9.1 8.9 - 51.8 mg/dL   Total Protein 6.4 (L) 6.5 - 8.1 g/dL   Albumin 3.2 (L) 3.5 - 5.0 g/dL   AST 18 15 - 41 U/L   ALT 19 0 - 44 U/L   Alkaline Phosphatase 86 38 - 126 U/L   Total Bilirubin 0.3 0.3 - 1.2 mg/dL   GFR, Estimated >84 >16 mL/min   Anion gap 17 (H) 5 - 15  I-stat chem 8, ed     Status: Abnormal   Collection Time: 02/14/23 10:42 AM  Result Value Ref Range   Sodium 140 135 - 145 mmol/L   Potassium 4.2 3.5 - 5.1 mmol/L   Chloride 102 98 - 111 mmol/L   BUN 14 8 - 23 mg/dL   Creatinine, Ser 6.06 0.44 - 1.00 mg/dL   Glucose, Bld 301 (H) 70 - 99 mg/dL   Calcium, Ion 6.01 (L) 1.15 - 1.40 mmol/L   TCO2 24 22 - 32 mmol/L   Hemoglobin 10.2 (L) 12.0 - 15.0 g/dL   HCT 09.3 (L) 23.5 - 57.3 %  POC occult blood, ED Provider will collect     Status: None   Collection Time: 02/14/23 12:07 PM  Result Value Ref Range  Fecal Occult Bld NEGATIVE NEGATIVE  Vitamin B12     Status: None   Collection Time: 02/14/23 12:21 PM  Result Value Ref Range   Vitamin B-12  403 180 - 914 pg/mL  Folate     Status: None   Collection Time: 02/14/23 12:21 PM  Result Value Ref Range   Folate 7.4 >5.9 ng/mL  Iron and TIBC     Status: Abnormal   Collection Time: 02/14/23 12:21 PM  Result Value Ref Range   Iron 24 (L) 28 - 170 ug/dL   TIBC 914 (H) 782 - 956 ug/dL   Saturation Ratios 5 (L) 10.4 - 31.8 %   UIBC 490 ug/dL  Ferritin     Status: Abnormal   Collection Time: 02/14/23 12:21 PM  Result Value Ref Range   Ferritin 8 (L) 11 - 307 ng/mL  Reticulocytes     Status: Abnormal   Collection Time: 02/14/23 12:21 PM  Result Value Ref Range   Retic Ct Pct 1.5 0.4 - 3.1 %   RBC. 4.15 3.87 - 5.11 MIL/uL   Retic Count, Absolute 61.0 19.0 - 186.0 K/uL   Immature Retic Fract 21.7 (H) 2.3 - 15.9 %  Ammonia     Status: None   Collection Time: 02/14/23 12:40 PM  Result Value Ref Range   Ammonia 23 9 - 35 umol/L  Valproic acid level     Status: Abnormal   Collection Time: 02/14/23 12:40 PM  Result Value Ref Range   Valproic Acid Lvl 36 (L) 50.0 - 100.0 ug/mL    Imaging results:  MR BRAIN WO CONTRAST  Result Date: 02/14/2023 CLINICAL DATA:  67 year old female code stroke presentation this morning with facial droop, left side weakness, lethargy. Last seen normal 1800 hours. EXAM: MRI HEAD WITHOUT CONTRAST TECHNIQUE: Multiplanar, multiecho pulse sequences of the brain and surrounding structures were obtained without intravenous contrast. COMPARISON:  CT head, CTA, CTP today.  Brain MRI 10/01/2022. FINDINGS: Brain: No restricted diffusion or evidence of acute infarction. Chronic nonspecific ventriculomegaly is stable since May. No transependymal edema. There is some evidence of hyperdynamic flow at the cerebral aqueduct. No midline shift, mass effect, evidence of mass lesion, extra-axial collection or acute intracranial hemorrhage. Cervicomedullary junction and pituitary are within normal limits. Stable gray and white matter signal throughout the brain. Scattered mild for age  subcortical white matter T2 and FLAIR hyperintensity in a nonspecific configuration. No cortical encephalomalacia or chronic cerebral blood products identified. Vascular: Major intracranial vascular flow voids are stable. Skull and upper cervical spine: Negative. Visualized bone marrow signal is within normal limits. Sinuses/Orbits: Stable, negative. Other: Visible internal auditory structures appear normal. Negative visible scalp and face. IMPRESSION: 1. No acute intracranial abnormality. 2. Chronic nonspecific ventriculomegaly. Consider NPH in the appropriate clinical setting. 3. Mild for age nonspecific white matter signal changes. Electronically Signed   By: Odessa Fleming M.D.   On: 02/14/2023 12:07   DG Chest Portable 1 View  Result Date: 02/14/2023 CLINICAL DATA:  Altered mental status EXAM: PORTABLE CHEST - 1 VIEW COMPARISON:  12/21/2022 FINDINGS: Mild coarse interstitial opacities in the lung bases. Heart size and mediastinal contours are within normal limits. No effusion. Visualized bones unremarkable. IMPRESSION: Mild bibasilar interstitial opacities. Electronically Signed   By: Corlis Leak M.D.   On: 02/14/2023 10:59   CT ANGIO HEAD NECK W WO CM W PERF (CODE STROKE)  Result Date: 02/14/2023 CLINICAL DATA:  67 year old female code stroke presentation. Altered mental status and slurred speech. EXAM: CT  ANGIOGRAPHY HEAD AND NECK CT PERFUSION BRAIN TECHNIQUE: Multidetector CT imaging of the head and neck was performed using the standard protocol during bolus administration of intravenous contrast. Multiplanar CT image reconstructions and MIPs were obtained to evaluate the vascular anatomy. Carotid stenosis measurements (when applicable) are obtained utilizing NASCET criteria, using the distal internal carotid diameter as the denominator. Multiphase CT imaging of the brain was performed following IV bolus contrast injection. Subsequent parametric perfusion maps were calculated using RAPID software. RADIATION  DOSE REDUCTION: This exam was performed according to the departmental dose-optimization program which includes automated exposure control, adjustment of the mA and/or kV according to patient size and/or use of iterative reconstruction technique. CONTRAST:  OMNIPAQUE IOHEXOL 350 MG/ML SOLN COMPARISON:  Head CT 0954 hours today.  Brain MRI 10/01/2022. FINDINGS: CT Brain Perfusion Findings: ASPECTS: 10 CBF (<30%) Volume: 0mL.  No parameter abnormality. Perfusion (Tmax>6.0s) volume: 0mL.  No parameter abnormality. Mismatch Volume: Not applicable Infarction Location:Not applicable CTA NECK Skeleton: Right TMJ degeneration. Mild for age cervical spine degeneration. No acute osseous abnormality identified. Upper chest: Mild respiratory motion, otherwise negative. Other neck: No acute finding. Aortic arch: Mildly bovine arch configuration. No arch atherosclerosis. Right carotid system: Negative brachiocephalic artery. Tortuous proximal right CCA. Negative right carotid bifurcation. Tortuous cervical right ICA but no plaque or stenosis. Left carotid system: Mildly tortuous left CCA. Negative left carotid bifurcation. No plaque or stenosis. Vertebral arteries: Proximal right subclavian artery is normal. The right vertebral artery has a shared origin with the thyrocervical trunk (series 9, image 273. Right vertebral artery is patent and within normal limits to the skull base. Normal proximal left subclavian artery. Normal left vertebral artery origin. Dominant left vertebral artery. Tortuous left V1 segment. Left vertebral artery is patent and normal to the skull base. CTA HEAD Posterior circulation: Patent distal vertebral arteries and vertebrobasilar junction with no plaque or stenosis. Dominant left V4 segment. Normal PICA origins. Patent basilar artery without stenosis. Normal SCA and PCA origins. Small posterior communicating arteries. Bilateral PCA branches are within normal limits, up to mild P2 segment  irregularity. Anterior circulation: Both ICA siphons are patent. Minimal if any ICA siphon plaque and no stenosis. Normal posterior communicating artery origins. Patent carotid termini. Normal MCA and ACA origins. Mild A1 tortuosity. Normal anterior communicating artery and bilateral ACA branches. Left MCA M1 segment bifurcates early without stenosis. Mild crossing venous vessel artifact at the left MCA bifurcation. Right MCA M1 segment and bifurcation are patent without stenosis. Bilateral MCA branches are within normal limits. Venous sinuses: Patent. Anatomic variants: Dominant left vertebral artery. Review of the MIP images confirms the above findings IMPRESSION: Negative CT Perfusion. And CTA is negative for LVO, atherosclerosis, or stenosis. Mild arterial tortuosity in the head and neck. These results were communicated to Dr. Lovett Sox at 10:28 am on 02/14/2023 by text page via the [facility] messaging system. Electronically Signed   By: Odessa Fleming M.D.   On: 02/14/2023 10:29   CT HEAD CODE STROKE WO CONTRAST  Result Date: 02/14/2023 CLINICAL DATA:  Code stroke. 67 year old female neurologic deficit (not otherwise specified at the time of this report). EXAM: CT HEAD WITHOUT CONTRAST TECHNIQUE: Contiguous axial images were obtained from the base of the skull through the vertex without intravenous contrast. RADIATION DOSE REDUCTION: This exam was performed according to the departmental dose-optimization program which includes automated exposure control, adjustment of the mA and/or kV according to patient size and/or use of iterative reconstruction technique. COMPARISON:  Brain MRI 10/01/2022  head CT 12/23/2022. FINDINGS: Brain: Stable cerebral volume. Stable nonspecific ventriculomegaly. No evidence of transependymal edema. No midline shift, mass effect, evidence of mass lesion, intracranial hemorrhage or evidence of cortically based acute infarction. Vascular: No suspicious intracranial vascular hyperdensity. Skull:  Osteopenia.  No acute osseous abnormality identified. Sinuses/Orbits: Visualized paranasal sinuses and mastoids are stable and well aerated. Other: No gaze deviation, acute orbit or scalp soft tissue finding. ASPECTS [facility] Stroke Program Early CT Score) Total score (0-10 with 10 being normal): 10 IMPRESSION: 1. No acute cortically based infarct or acute intracranial hemorrhage identified. ASPECTS 10. 2. Stable chronic nonspecific ventriculomegaly. 3. These results were communicated to Dr. Viviann Spare at 9:58 am on 02/14/2023 by text page via the [facility] messaging system. Electronically Signed   By: Odessa Fleming M.D.   On: 02/14/2023 09:59    Assessment and Plan: I agree with the formulated Assessment and Plan with the following changes:   Weakness Etiology is unclear She has a elevated Glc (200s) and a UA which shows LE/Nitr (-).  Appreciate neuro eval (although her CT/MRI do not show CVA). Could this be NPH? No hx of incontinence EEG result pending.  Check lipids.   DM2 FSG/Watch glc, SSI

## 2023-02-14 NOTE — H&P (Cosign Needed Addendum)
Date: 02/14/2023               Patient Name:  Annette Martin MRN: [id number]  DOB: May 04, 1956 Age / Sex: 67 y.o., female   PCP: Galvin Proffer, MD         Medical Service: Internal Medicine Teaching Service         Attending Physician: Dr. Johny Sax, MD    First Contact: Dr. Philomena Doheny, MD Pager: [phone]   Second Contact: Dr. Gwenevere Abbot, MD Pager: [phone]        After Hours (After 5p/  First Contact Pager: [phone]  weekends / holidays): Second Contact Pager: [phone]   Chief Complaint: AMS, left sided weakness   History of Present Illness:   Patient is a 67 yo patient with a history of depression, T2DM, schizophrenia, hypothyroidism, HLD, GERD, urinary incontinence, and mild MR who presents from her assisted living facility with left sided weakness and AMS. She was admitted to the Internal Medicine Service for further workup.   History obtained from patient as well as staff from her assisted living facility [facility]). Patient alert and oriented, was able to answer simple yes/no questions, as well as questions facilitated by multiple choice.   - Patient altered since 1800 9/28. Complained of diffuse weakness, especially on left side. Also endorses a worsening, diffuse headache that started today with associated sensitivity to light.  - Patient noted left facial numbness, which she says began yesterday.  - Patient also endorsing some pain with urination that started today, denies hematuria. Last BM today, denies hematochezia.  - Patient denies any sick contacts, says she sneezes and coughs all the time so not new, denies fevers, does endorse some episodes of vomiting at night during the past week.  - Reports she fell from her bed yesterday, unknown time. Stated she fell directly on her front, not one specific side. Having some pain on her right foot but described it as nerve pain.  - Also endorses taking all of her medications recently with no  missed doses.  - Requested food and for lights to be turned off at the end of the interview.  - Per facility (spoke to [facility], patient tech), patient's speech is understandable and is able to use complete sentences to communicate her wants and needs. Patient was noted to be leaning towards her left side, drooling, and 'not acting right' since yesterday evening. Unaware of possible fall, nothing was noted about that yesterday. Patient wears a pull-up, needs assistance to use the bathroom. Prefer assistance with most tasks.  - Confirmed Full Code with facility, legal guardian is brother Freida Busman, may be out of the country at this time. Facility will contact him to update him on patient.   ED course:  Code stroke initiated when patient arrived to the ED and neurology was consulted. Vitals include BP 162/74, HR 90, afebrile, on room air. CBG 264.  Potassium noted to be 7.4 on admission, likely spurious lab. Hgb, 10.2, Cr. 0.80. FOBT and ethanol screen negative. EKG NSR, HR 89, prolonged Qtc 485. CT head, CT angio, MR brain not concerning for acute ischemic or hemorrhaging stroke, CXR with mild bibasilar interstitial opacities. COVID PCR negative. Repeat K 4.0, 4.2. Given Na 1000 mL bolus.   Meds:  Home meds per skilled nursing facility list:  Fesoterodine 4 mg daily (for urinary incontinence)  Lactulose 30 ml solution daily  Meloxicam 7.5 mg daily  Setraline 100 mg BID daily (for mood)  Amantadine 100 mg BID (for parkinsonian tremors)  Metformin 500 mg BID (for DM)  Nuedexta 20-10 mg BID (for anxiety)  Pantoprazole 40 mg BID (for GERD)  ABH 2/25/2 gel TID  Bethanechol 25 mg TID (for urinary incontinence)  Clonazepam 0.5 mg TID (for anxiety)  Divalproex 250 mg TID (for mood)  Gabapentin 300 mg TID (for neuropathy)  Haloperidol 5 mg, take 1.5 tablets for 7.5 mg daily  Atorvastatin 10 mg at bedtime (for HLD) Docusate 100 mg x 2 at bedtime (for constipation)  Lithium carbonate 150 mg- discontinue   Magoxide 400 mg take daily at lunch and dinner  Current Meds  Medication Sig   amantadine (SYMMETREL) 100 MG capsule Take 100 mg by mouth 2 (two) times daily.   atorvastatin (LIPITOR) 10 MG tablet Take 10 mg by mouth at bedtime.   bethanechol (URECHOLINE) 25 MG tablet Take 25 mg by mouth 3 (three) times daily.   clonazePAM (KLONOPIN) 0.5 MG tablet Take 1 tablet (0.5 mg total) by mouth 3 (three) times daily. For anxiety   Dextromethorphan-quiNIDine (NUEDEXTA) 20-10 MG CAPS Take 1 capsule by mouth 2 (two) times daily.   divalproex (DEPAKOTE) 250 MG DR tablet Take 250 mg by mouth 3 (three) times daily.   docusate sodium (COLACE) 100 MG capsule Take 200 mg by mouth at bedtime.   fesoterodine (TOVIAZ) 4 MG TB24 tablet Take 4 mg by mouth every evening.   gabapentin (NEURONTIN) 300 MG capsule Take 300 mg by mouth 3 (three) times daily.   haloperidol (HALDOL) 5 MG tablet Take 7.5 mg by mouth 3 (three) times daily.   haloperidol (HALDOL) 5 MG tablet Take 5 mg by mouth every 12 (twelve) hours as needed for agitation.   lactulose (CHRONULAC) 10 GM/15ML solution Take 20 g by mouth daily.   magnesium oxide (MAG-OX) 400 (240 Mg) MG tablet Take 400 mg by mouth 2 (two) times daily. Lunch and dinner   meclizine (ANTIVERT) 25 MG tablet Take 25 mg by mouth 3 (three) times daily as needed for dizziness.   meloxicam (MOBIC) 7.5 MG tablet Take 7.5 mg by mouth daily.   metFORMIN (GLUCOPHAGE) 1000 MG tablet Take 500 mg by mouth 2 (two) times daily with a meal.   ondansetron (ZOFRAN) 4 MG tablet Take 4 mg by mouth every 6 (six) hours as needed for nausea or vomiting.   pantoprazole (PROTONIX) 40 MG tablet Take 40 mg by mouth 2 (two) times daily.   sertraline (ZOLOFT) 100 MG tablet Take 200 mg by mouth daily.    Allergies: dust, mold Allergies as of 02/14/2023   (No Known Allergies)   Past Medical History:  Diagnosis Date   Depression    Diabetes mellitus    type 2   GERD (gastroesophageal reflux disease)     Hypertension    Hypoxemia    Mental retardation    Neuroleptic malignant syndrome    Rhabdomyolysis    Schizophrenia (HCC)    Thyroid disease    Vitamin D deficient osteomalacia    Family History:  Family history of lung cancer.   Social History:  [facility] Patient ambulates with wheelchair  PCP Dr. Ardelle Park ([facility])  Review of Systems: A complete ROS was negative except as per HPI.   Physical Exam: Blood pressure (!) 163/68, pulse 84, temperature (!) 97.2 F (36.2 C), temperature source Oral, resp. rate 18, height 5\' 2"  (1.575 m), weight 99.4 kg, SpO2 100%. General: Patient resting in bed in no acute  distress. Mostly eyes closed, endorsed light sensitivity. Able to answer simple questions appropriately, slow to answer.  CV: RRR, no r/m/g, trace b/l lower leg edema Pulmonary: wet cough on exam, some upper airway congestion, normal respiratory effort on RA Abdominal: soft, non-distended, diffusely tender on palpation, positive bowel sounds Neuro: patient with observable left facial droop, drool pooling on side of mouth, speaking at a slow rate with dysarthric speech, unsure if this is baseline. Decreased sensation on left along V2, V3 distribution. Patient was able to follow simple prompts with verbal cues and repetitions. Patient able to spontaneously lift and move R and L lower extremities and R upper extremity. Unable to lift left arm against gravity without assistance, observed wiggling R hand fingers. Difficult to tell if patient's strength and ROM are at baseline. R foot pedal pulse intact, cool to touch, no wounds or overt bleeding observed.  Psych: Normal mood, flat affect  Labs    Latest Ref Rng & Units 02/14/2023   10:42 AM 02/14/2023   10:31 AM 02/14/2023   10:04 AM  CBC  WBC 4.0 - 10.5 K/uL  5.1    Hemoglobin 12.0 - 15.0 g/dL 16.1  9.4  09.6   Hematocrit 36.0 - 46.0 % 30.0  32.5  30.0   Platelets 150 - 400 K/uL  212          Latest Ref Rng & Units 02/14/2023   10:42 AM 02/14/2023   10:31 AM 02/14/2023   10:04 AM  CMP  Glucose 70 - 99 mg/dL 045  409  811   BUN 8 - 23 mg/dL 14  13  19    Creatinine 0.44 - 1.00 mg/dL 9.14  7.82  9.56   Sodium 135 - 145 mmol/L 140  138  135   Potassium 3.5 - 5.1 mmol/L 4.2  4.0  7.4   Chloride 98 - 111 mmol/L 102  98  103   CO2 22 - 32 mmol/L  23    Calcium 8.9 - 10.3 mg/dL  9.1    Total Protein 6.5 - 8.1 g/dL  6.4    Total Bilirubin 0.3 - 1.2 mg/dL  0.3    Alkaline Phos 38 - 126 U/L  86    AST 15 - 41 U/L  18    ALT 0 - 44 U/L  19      UA: pH 6, glucose 150, ketones present, negative for proteins, nitrite, or leukocytes CK 178 WNL PT/INR WNL  aPTT 21 Vitamin B12 WNL  Folate WNL  Iron studies: Iron 24 (low), TIBC 514 (elevated), saturation 5% (low), ferritin 8 (low); reticulocytes  21.7 (elevated) Ammonia 23 WNL  Valproic acid 36 low (normal 50-100) HIV pending   Imaging EKG: personally reviewed my interpretation is NSR, HR 89, prolonged Qtc 485  CXR: personally reviewed my interpretation is mild bibasilar interstitial opacities.   CT head 9/29:  IMPRESSION: 1. No acute cortically based infarct or acute intracranial hemorrhage identified. ASPECTS 10. 2. Stable chronic nonspecific ventriculomegaly. 3. These results were communicated to Dr. Viviann Spare at 9:58 am on 02/14/2023 by text page via the [facility] messaging system.  CT angio 9/29:  IMPRESSION: Negative CT Perfusion. And CTA is negative for LVO, atherosclerosis, or stenosis. Mild arterial tortuosity in the head and neck.  MR brain 9/29:  IMPRESSION: 1. No acute intracranial abnormality. 2. Chronic nonspecific ventriculomegaly. Consider NPH in the appropriate clinical setting. 3. Mild for age nonspecific white matter signal changes.  Assessment & Plan by Problem: Principal Problem:  Acute left-sided weakness Active Problems:   Hyperlipidemia  AMS with associated left sided weakness of unclear etiology.  Ddx on admission included stroke vs infectious. Last known normal 18:00 9/28. No evidence of CVA on imaging. Per patient, left sided weakness and left facial numbness new. Possibly TIA, will monitor to see if patient returns to baseline < 24 hours. Will rule out new seizure with EEG, history of taking valproate but for mood not seizures. Neurology following, appreciate their recommendations.   Low suspicion for infectious etiology. CXR with mild bibasilar interstitial opacities. Wet cough on exam otherwise normal respiratory effort on room air, negative respiratory panel and COVID screening. Will perform bedside swallow study to help determine if patient is aspirating and/or can tolerate PO. Denies sick contacts. Patient does endorse dyruria with urination, history of urinary incontinence, however UA unremarkable for infection. Will follow up on urine culture.   Etiology less likely to be trauma related as patient reports recent fall from her bed at the assisted living facility, but no evidence of overt bleeding or bruising on physical exam, or of hemorrhage on imaging.    Patient is on a lot of medications, AMS could be medication induced. Per patient has not missed any doses, takes all medications given to her by the facility. Will hold off on restarting medications until cleared to swallow.   Plan:  - f/u EEG and neurology recommendations, patient with outpatient appointment with [facility] scheduled for 11/18 - f/u bedside swallow, add SLP eval if needed - hold PO medications until able to swallow safely     #Anemia Patient with Hgb 10.2 on admission, repeat 9.4. No recent reference to establish baseline. Per patient, history of recent fall. No overt bleeding on exam, FOBT negative. Iron studies showing iron deficiency. Iron deficit calculated at 1358 mg.  - Plan to start PO iron tablet daily after patient passes swallow study - Monitor daily CBC  #Urinary  incontinence History of urinary incontinence, on home fesoterodine and bethanecol. UA unremarkable, ordered urine culture. Patient reporting dysuria that started today. External urinary catheter ordered.  - F/u post void bladder scan  - F/u urin culture - Resume PO home meds after swallow study if appropriate   Chronic medications #T2DM Chronic. No recent A1C. Takes metformin 500 mg BID at assisted living facility. Will monitor CBGs and place patient on moderate SSI. Follow up on A1C.  #Mood disorders History of schizophrenia, depression, and anxiety. Medications include sertraline, haloperidol, and depakote. Will start sertraline and depakote when patient is able to pass bedside swallow study. Holding off on clonazepam and haloperidol.  #GERD Chronic, will resume pantoprazole 40 mg BID when safe for patient to swallow.   Diet: NPO  DVT prophylaxis: lovenox 40 mg  Code status: Full   Dispo: Admit patient to Inpatient with expected length of stay greater than 2 midnights.  Signed: Philomena Doheny, MD 02/14/2023, 3:39 PM  After 5pm on weekdays and 1pm on weekends: On Call pager: [phone]

## 2023-02-14 NOTE — Procedures (Signed)
Patient Name: Annette Martin  MRN: [id number]  Epilepsy Attending: Charlsie Quest  Referring Physician/Provider: Elmer Picker, NP  Date: 02/14/2023 Duration: 23.52 mins  Patient history: 68yo F with facial droop, left sided weakness and lethargy. EEG to evaluate for seizure  Level of alertness: Awake  AEDs during EEG study: None  Technical aspects: This EEG study was done with scalp electrodes positioned according to the 10-20 International system of electrode placement. Electrical activity was reviewed with band pass filter of 1-70Hz , sensitivity of 7 uV/mm, display speed of 26mm/sec with a 60Hz  notched filter applied as appropriate. EEG data were recorded continuously and digitally stored.  Video monitoring was available and reviewed as appropriate.  Description: The posterior dominant rhythm consists of 8 Hz activity of moderate voltage (25-35 uV) seen predominantly in posterior head regions, symmetric and reactive to eye opening and eye closing. Physiologic photic driving was seen during photic stimulation. Hyperventilation and photic stimulation were not performed.     IMPRESSION: This study is within normal limits. No seizures or epileptiform discharges were seen throughout the recording.  A normal interictal EEG does not exclude  the diagnosis of epilepsy.  [name]

## 2023-02-14 NOTE — ED Notes (Signed)
Patient transported to MRI 

## 2023-02-14 NOTE — ED Notes (Signed)
Spoke with Alece in lab, clotted PT/PTT and CBC. To recollect by this RN

## 2023-02-14 NOTE — ED Provider Notes (Signed)
[place] EMERGENCY DEPARTMENT AT [facility] Provider Note   CSN: [id number] Arrival date & time: 02/14/23  9562  An emergency department physician performed an initial assessment on this suspected stroke patient at 0940.  History  Chief Complaint  Patient presents with   Code Stroke    Annette Martin is a 67 y.o. female.  Pt is a 67 yo female with pmhx significant for depression, hypothyroidism, gerd, dm2, schizophrenia, htn, and mr.  Pt has AMS since last night on 1800.  EMS did call a code stroke en route because she seemed to have some left sided weakness.  Pt is a very poor historian.       Home Medications Prior to Admission medications   Medication Sig Start Date End Date Taking? Authorizing Provider  amantadine (SYMMETREL) 100 MG capsule Take 100 mg by mouth 2 (two) times daily.   Yes [provider]  atorvastatin (LIPITOR) 10 MG tablet Take 10 mg by mouth at bedtime.   Yes [provider]  bethanechol (URECHOLINE) 25 MG tablet Take 25 mg by mouth 3 (three) times daily.   Yes [provider]  clonazePAM (KLONOPIN) 0.5 MG tablet Take 1 tablet (0.5 mg total) by mouth 3 (three) times daily. For anxiety 04/12/18  Yes Nwoko, Nicole Kindred I, NP  Dextromethorphan-quiNIDine (NUEDEXTA) 20-10 MG CAPS Take 1 capsule by mouth 2 (two) times daily.   Yes [provider]  divalproex (DEPAKOTE) 250 MG DR tablet Take 250 mg by mouth 3 (three) times daily.   Yes [provider]  docusate sodium (COLACE) 100 MG capsule Take 200 mg by mouth at bedtime.   Yes [provider]  fesoterodine (TOVIAZ) 4 MG TB24 tablet Take 4 mg by mouth every evening.   Yes [provider]  gabapentin (NEURONTIN) 300 MG capsule Take 300 mg by mouth 3 (three) times daily.   Yes [provider]  haloperidol (HALDOL) 5 MG tablet Take 7.5 mg by mouth 3 (three) times daily. 05/29/19  Yes [provider]  haloperidol (HALDOL) 5 MG  tablet Take 5 mg by mouth every 12 (twelve) hours as needed for agitation.   Yes [provider]  lactulose (CHRONULAC) 10 GM/15ML solution Take 20 g by mouth daily.   Yes [provider]  magnesium oxide (MAG-OX) 400 (240 Mg) MG tablet Take 400 mg by mouth 2 (two) times daily. Lunch and dinner   Yes [provider]  meclizine (ANTIVERT) 25 MG tablet Take 25 mg by mouth 3 (three) times daily as needed for dizziness.   Yes [provider]  meloxicam (MOBIC) 7.5 MG tablet Take 7.5 mg by mouth daily.   Yes [provider]  metFORMIN (GLUCOPHAGE) 1000 MG tablet Take 500 mg by mouth 2 (two) times daily with a meal.   Yes [provider]  ondansetron (ZOFRAN) 4 MG tablet Take 4 mg by mouth every 6 (six) hours as needed for nausea or vomiting.   Yes [provider]  pantoprazole (PROTONIX) 40 MG tablet Take 40 mg by mouth 2 (two) times daily.   Yes [provider]  sertraline (ZOLOFT) 100 MG tablet Take 200 mg by mouth daily.   Yes [provider]      Allergies    Patient has no known allergies.    Review of Systems   Review of Systems  Neurological:  Positive for weakness.  All other systems reviewed and are negative.   Physical Exam Updated Vital Signs  BP (!) 149/69   Pulse 84   Temp (!) 97.2 F (36.2 C) (Oral)   Resp 17   Ht 5\' 2"  (1.575 m)   Wt 99.4 kg   SpO2 100%   BMI 40.08 kg/m  Physical Exam Vitals and nursing note reviewed.  Constitutional:      Appearance: Normal appearance.  HENT:     Head: Normocephalic and atraumatic.     Right Ear: External ear normal.     Left Ear: External ear normal.     Nose: Nose normal.     Mouth/Throat:     Mouth: Mucous membranes are dry.     Pharynx: Oropharynx is clear.  Eyes:     Extraocular Movements: Extraocular movements intact.     Conjunctiva/sclera: Conjunctivae normal.     Pupils: Pupils are equal, round, and reactive to light.  Cardiovascular:      Rate and Rhythm: Normal rate and regular rhythm.     Pulses: Normal pulses.     Heart sounds: Normal heart sounds.  Pulmonary:     Effort: Pulmonary effort is normal.     Breath sounds: Normal breath sounds.  Abdominal:     General: Abdomen is flat. Bowel sounds are normal.     Palpations: Abdomen is soft.  Genitourinary:    Rectum: Guaiac result negative.  Musculoskeletal:        General: Normal range of motion.     Cervical back: Normal range of motion and neck supple.  Skin:    General: Skin is warm.     Capillary Refill: Capillary refill takes less than 2 seconds.  Neurological:     Mental Status: She is easily aroused. She is disoriented.     Comments: Unclear mental baseline.  She is moving all 4 extremities.  ? Drift on left (?effort)  Psychiatric:        Mood and Affect: Mood normal.        Behavior: Behavior normal.     ED Results / Procedures / Treatments   Labs (all labs ordered are listed, but only abnormal results are displayed) Labs Reviewed  URINALYSIS, ROUTINE W REFLEX MICROSCOPIC - Abnormal; Notable for the following components:      Result Value   Specific Gravity, Urine >1.046 (*)    Glucose, UA 150 (*)    Ketones, ur 5 (*)    All other components within normal limits  APTT - Abnormal; Notable for the following components:   aPTT 21 (*)    All other components within normal limits  CBC WITH DIFFERENTIAL/PLATELET - Abnormal; Notable for the following components:   Hemoglobin 9.4 (*)    HCT 32.5 (*)    MCH 23.2 (*)    MCHC 28.9 (*)    RDW 16.0 (*)    All other components within normal limits  COMPREHENSIVE METABOLIC PANEL - Abnormal; Notable for the following components:   Glucose, Bld 235 (*)    Total Protein 6.4 (*)    Albumin 3.2 (*)    Anion gap 17 (*)    All other components within normal limits  RETICULOCYTES - Abnormal; Notable for the following components:   Immature Retic Fract 21.7 (*)    All other components within normal limits   I-STAT CHEM 8, ED - Abnormal; Notable for the following components:   Potassium 7.4 (*)    Glucose, Bld 274 (*)    Calcium, Ion 0.99 (*)    Hemoglobin 10.2 (*)    HCT 30.0 (*)  All other components within normal limits  CBG MONITORING, ED - Abnormal; Notable for the following components:   Glucose-Capillary 264 (*)    All other components within normal limits  I-STAT CHEM 8, ED - Abnormal; Notable for the following components:   Glucose, Bld 239 (*)    Calcium, Ion 1.08 (*)    Hemoglobin 10.2 (*)    HCT 30.0 (*)    All other components within normal limits  SARS CORONAVIRUS 2 BY RT PCR  ETHANOL  CK  PROTIME-INR  VITAMIN B12  FOLATE  IRON AND TIBC  FERRITIN  AMMONIA  VALPROIC ACID LEVEL  POC OCCULT BLOOD, ED    EKG EKG Interpretation Date/Time:  Sunday February 14 2023 10:12:36 EDT Ventricular Rate:  89 PR Interval:  153 QRS Duration:  82 QT Interval:  398 QTC Calculation: 485 R Axis:   11  Text Interpretation: Sinus rhythm No significant change since last tracing Confirmed by Jacalyn Lefevre [phone]) on 02/14/2023 10:27:35 AM  Radiology MR BRAIN WO CONTRAST  Result Date: 02/14/2023 CLINICAL DATA:  67 year old female code stroke presentation this morning with facial droop, left side weakness, lethargy. Last seen normal 1800 hours. EXAM: MRI HEAD WITHOUT CONTRAST TECHNIQUE: Multiplanar, multiecho pulse sequences of the brain and surrounding structures were obtained without intravenous contrast. COMPARISON:  CT head, CTA, CTP today.  Brain MRI 10/01/2022. FINDINGS: Brain: No restricted diffusion or evidence of acute infarction. Chronic nonspecific ventriculomegaly is stable since May. No transependymal edema. There is some evidence of hyperdynamic flow at the cerebral aqueduct. No midline shift, mass effect, evidence of mass lesion, extra-axial collection or acute intracranial hemorrhage. Cervicomedullary junction and pituitary are within normal limits. Stable gray and  white matter signal throughout the brain. Scattered mild for age subcortical white matter T2 and FLAIR hyperintensity in a nonspecific configuration. No cortical encephalomalacia or chronic cerebral blood products identified. Vascular: Major intracranial vascular flow voids are stable. Skull and upper cervical spine: Negative. Visualized bone marrow signal is within normal limits. Sinuses/Orbits: Stable, negative. Other: Visible internal auditory structures appear normal. Negative visible scalp and face. IMPRESSION: 1. No acute intracranial abnormality. 2. Chronic nonspecific ventriculomegaly. Consider NPH in the appropriate clinical setting. 3. Mild for age nonspecific white matter signal changes. Electronically Signed   By: Odessa Fleming M.D.   On: 02/14/2023 12:07   DG Chest Portable 1 View  Result Date: 02/14/2023 CLINICAL DATA:  Altered mental status EXAM: PORTABLE CHEST - 1 VIEW COMPARISON:  12/21/2022 FINDINGS: Mild coarse interstitial opacities in the lung bases. Heart size and mediastinal contours are within normal limits. No effusion. Visualized bones unremarkable. IMPRESSION: Mild bibasilar interstitial opacities. Electronically Signed   By: Corlis Leak M.D.   On: 02/14/2023 10:59   CT ANGIO HEAD NECK W WO CM W PERF (CODE STROKE)  Result Date: 02/14/2023 CLINICAL DATA:  67 year old female code stroke presentation. Altered mental status and slurred speech. EXAM: CT ANGIOGRAPHY HEAD AND NECK CT PERFUSION BRAIN TECHNIQUE: Multidetector CT imaging of the head and neck was performed using the standard protocol during bolus administration of intravenous contrast. Multiplanar CT image reconstructions and MIPs were obtained to evaluate the vascular anatomy. Carotid stenosis measurements (when applicable) are obtained utilizing NASCET criteria, using the distal internal carotid diameter as the denominator. Multiphase CT imaging of the brain was performed following IV bolus contrast injection. Subsequent  parametric perfusion maps were calculated using RAPID software. RADIATION DOSE REDUCTION: This exam was performed according to the departmental dose-optimization program which includes automated exposure  control, adjustment of the mA and/or kV according to patient size and/or use of iterative reconstruction technique. CONTRAST:  OMNIPAQUE IOHEXOL 350 MG/ML SOLN COMPARISON:  Head CT 0954 hours today.  Brain MRI 10/01/2022. FINDINGS: CT Brain Perfusion Findings: ASPECTS: 10 CBF (<30%) Volume: 0mL.  No parameter abnormality. Perfusion (Tmax>6.0s) volume: 0mL.  No parameter abnormality. Mismatch Volume: Not applicable Infarction Location:Not applicable CTA NECK Skeleton: Right TMJ degeneration. Mild for age cervical spine degeneration. No acute osseous abnormality identified. Upper chest: Mild respiratory motion, otherwise negative. Other neck: No acute finding. Aortic arch: Mildly bovine arch configuration. No arch atherosclerosis. Right carotid system: Negative brachiocephalic artery. Tortuous proximal right CCA. Negative right carotid bifurcation. Tortuous cervical right ICA but no plaque or stenosis. Left carotid system: Mildly tortuous left CCA. Negative left carotid bifurcation. No plaque or stenosis. Vertebral arteries: Proximal right subclavian artery is normal. The right vertebral artery has a shared origin with the thyrocervical trunk (series 9, image 273. Right vertebral artery is patent and within normal limits to the skull base. Normal proximal left subclavian artery. Normal left vertebral artery origin. Dominant left vertebral artery. Tortuous left V1 segment. Left vertebral artery is patent and normal to the skull base. CTA HEAD Posterior circulation: Patent distal vertebral arteries and vertebrobasilar junction with no plaque or stenosis. Dominant left V4 segment. Normal PICA origins. Patent basilar artery without stenosis. Normal SCA and PCA origins. Small posterior communicating arteries.  Bilateral PCA branches are within normal limits, up to mild P2 segment irregularity. Anterior circulation: Both ICA siphons are patent. Minimal if any ICA siphon plaque and no stenosis. Normal posterior communicating artery origins. Patent carotid termini. Normal MCA and ACA origins. Mild A1 tortuosity. Normal anterior communicating artery and bilateral ACA branches. Left MCA M1 segment bifurcates early without stenosis. Mild crossing venous vessel artifact at the left MCA bifurcation. Right MCA M1 segment and bifurcation are patent without stenosis. Bilateral MCA branches are within normal limits. Venous sinuses: Patent. Anatomic variants: Dominant left vertebral artery. Review of the MIP images confirms the above findings IMPRESSION: Negative CT Perfusion. And CTA is negative for LVO, atherosclerosis, or stenosis. Mild arterial tortuosity in the head and neck. These results were communicated to Dr. Lovett Sox at 10:28 am on 02/14/2023 by text page via the [facility] messaging system. Electronically Signed   By: Odessa Fleming M.D.   On: 02/14/2023 10:29   CT HEAD CODE STROKE WO CONTRAST  Result Date: 02/14/2023 CLINICAL DATA:  Code stroke. 67 year old female neurologic deficit (not otherwise specified at the time of this report). EXAM: CT HEAD WITHOUT CONTRAST TECHNIQUE: Contiguous axial images were obtained from the base of the skull through the vertex without intravenous contrast. RADIATION DOSE REDUCTION: This exam was performed according to the departmental dose-optimization program which includes automated exposure control, adjustment of the mA and/or kV according to patient size and/or use of iterative reconstruction technique. COMPARISON:  Brain MRI 10/01/2022 head CT 12/23/2022. FINDINGS: Brain: Stable cerebral volume. Stable nonspecific ventriculomegaly. No evidence of transependymal edema. No midline shift, mass effect, evidence of mass lesion, intracranial hemorrhage or evidence of cortically based acute  infarction. Vascular: No suspicious intracranial vascular hyperdensity. Skull: Osteopenia.  No acute osseous abnormality identified. Sinuses/Orbits: Visualized paranasal sinuses and mastoids are stable and well aerated. Other: No gaze deviation, acute orbit or scalp soft tissue finding. ASPECTS [facility] Stroke Program Early CT Score) Total score (0-10 with 10 being normal): 10 IMPRESSION: 1. No acute cortically based infarct or acute intracranial hemorrhage identified. ASPECTS 10.  2. Stable chronic nonspecific ventriculomegaly. 3. These results were communicated to Dr. Viviann Spare at 9:58 am on 02/14/2023 by text page via the [facility] messaging system. Electronically Signed   By: Odessa Fleming M.D.   On: 02/14/2023 09:59    Procedures Procedures    Medications Ordered in ED Medications  sodium chloride flush (NS) 0.9 % injection 3 mL (3 mLs Intravenous Given 02/14/23 1039)  iohexol (OMNIPAQUE) 350 MG/ML injection 100 mL (100 mLs Intravenous Contrast Given 02/14/23 1008)  sodium chloride 0.9 % bolus 1,000 mL (1,000 mLs Intravenous New Bag/Given 02/14/23 1157)    ED Course/ Medical Decision Making/ A&P                                 Medical Decision Making Amount and/or Complexity of Data Reviewed Labs: ordered. Radiology: ordered.  Risk Decision regarding hospitalization.   This patient presents to the ED for concern of ams, this involves an extensive number of treatment options, and is a complaint that carries with it a high risk of complications and morbidity.  The differential diagnosis includes cva, infection   Co morbidities that complicate the patient evaluation   depression, hypothyroidism, gerd, dm2, schizophrenia, htn, and mr.   Additional history obtained:  Additional history obtained from epic chart review External records from outside source obtained and reviewed including EMS report   Lab Tests:  I Ordered, and personally interpreted labs.  The pertinent results include:  cbc  with hgb low at 9.4 (10.8 on 8/5); ua + glucose and ketones, etoh neg, dk nl, covid neg   Imaging Studies ordered:  I ordered imaging studies including cxr, ct head, ct angio head  I independently visualized and interpreted imaging which showed  CXR: Mild bibasilar interstitial opacities.  CT head: . No acute cortically based infarct or acute intracranial  hemorrhage identified. ASPECTS 10.  2. Stable chronic nonspecific ventriculomegaly.   CTA head: Negative CT Perfusion. And CTA is negative for LVO, atherosclerosis,  or stenosis. Mild arterial tortuosity in the head and neck.  MRI brain: No acute intracranial abnormality.  2. Chronic nonspecific ventriculomegaly. Consider NPH in the  appropriate clinical setting.  3. Mild for age nonspecific white matter signal changes.   I agree with the radiologist interpretation   Cardiac Monitoring:  The patient was maintained on a cardiac monitor.  I personally viewed and interpreted the cardiac monitored which showed an underlying rhythm of: nsr   Medicines ordered and prescription drug management:  I ordered medication including ivfs  for sx  Reevaluation of the patient after these medicines showed that the patient improved I have reviewed the patients home medicines and have made adjustments as needed   Test Considered:  Ct/mri   Critical Interventions:  Code stroke   Consultations Obtained:  I requested consultation with the neurologist (Dr. Viviann Spare),  and discussed lab and imaging findings as well as pertinent plan - pt does not meet criteria for tnk.   Pt d/w IMTS for admission   Problem List / ED Course:  Ams:  unclear etiology.  No evidence of CVA.  She has a cough on exam, but no fever, pna or covid.  Pt is on a lot of medications, so it could be medication induced.   Reevaluation:  After the interventions noted above, I reevaluated the patient and found that they have :improved   Social Determinants of  Health:  Lives in [place]  Dispostion:  After consideration of the diagnostic results and the patients response to treatment, I feel that the patent would benefit from admission.    CRITICAL CARE Performed by: Jacalyn Lefevre   Total critical care time: 30 minutes  Critical care time was exclusive of separately billable procedures and treating other patients.  Critical care was necessary to treat or prevent imminent or life-threatening deterioration.  Critical care was time spent personally by me on the following activities: development of treatment plan with patient and/or surrogate as well as nursing, discussions with consultants, evaluation of patient's response to treatment, examination of patient, obtaining history from patient or surrogate, ordering and performing treatments and interventions, ordering and review of laboratory studies, ordering and review of radiographic studies, pulse oximetry and re-evaluation of patient's condition.        Final Clinical Impression(s) / ED Diagnoses Final diagnoses:  Anemia, unspecified type  Encephalopathy acute    Rx / DC Orders ED Discharge Orders     None         Jacalyn Lefevre, MD 02/14/23 1309

## 2023-02-15 DIAGNOSIS — D649 Anemia, unspecified: Secondary | ICD-10-CM | POA: Diagnosis not present

## 2023-02-15 DIAGNOSIS — R531 Weakness: Secondary | ICD-10-CM | POA: Diagnosis not present

## 2023-02-15 LAB — CBC
HCT: 32 % — ABNORMAL LOW (ref 36.0–46.0)
Hemoglobin: 9.6 g/dL — ABNORMAL LOW (ref 12.0–15.0)
MCH: 24 pg — ABNORMAL LOW (ref 26.0–34.0)
MCHC: 30 g/dL (ref 30.0–36.0)
MCV: 80 fL (ref 80.0–100.0)
Platelets: 229 10*3/uL (ref 150–400)
RBC: 4 MIL/uL (ref 3.87–5.11)
RDW: 16.5 % — ABNORMAL HIGH (ref 11.5–15.5)
WBC: 6.1 10*3/uL (ref 4.0–10.5)
nRBC: 0 % (ref 0.0–0.2)

## 2023-02-15 LAB — GLUCOSE, CAPILLARY
Glucose-Capillary: 128 mg/dL — ABNORMAL HIGH (ref 70–99)
Glucose-Capillary: 143 mg/dL — ABNORMAL HIGH (ref 70–99)
Glucose-Capillary: 168 mg/dL — ABNORMAL HIGH (ref 70–99)

## 2023-02-15 LAB — BASIC METABOLIC PANEL
Anion gap: 14 (ref 5–15)
BUN: 14 mg/dL (ref 8–23)
CO2: 28 mmol/L (ref 22–32)
Calcium: 9 mg/dL (ref 8.9–10.3)
Chloride: 99 mmol/L (ref 98–111)
Creatinine, Ser: 0.86 mg/dL (ref 0.44–1.00)
GFR, Estimated: >60 mL/min (ref >60)
Glucose, Bld: 176 mg/dL — ABNORMAL HIGH (ref 70–99)
Potassium: 4 mmol/L (ref 3.5–5.1)
Sodium: 141 mmol/L (ref 135–145)

## 2023-02-15 LAB — HEMOGLOBIN A1C
Hgb A1c MFr Bld: 7.6 % — ABNORMAL HIGH (ref 4.8–5.6)
Mean Plasma Glucose: 171.42 mg/dL

## 2023-02-15 LAB — CBG MONITORING, ED: Glucose-Capillary: 155 mg/dL — ABNORMAL HIGH (ref 70–99)

## 2023-02-15 MED ORDER — CLONAZEPAM 0.25 MG PO TBDP
0.2500 mg | ORAL_TABLET | Freq: Three times a day (TID) | ORAL | Status: DC
  Administered 2023-02-15 – 2023-02-19 (×13): 0.25 mg via ORAL
  Filled 2023-02-15 (×13): qty 1

## 2023-02-15 MED ORDER — INFLUENZA VAC A&B SURF ANT ADJ 0.5 ML IM SUSY
0.5000 mL | PREFILLED_SYRINGE | INTRAMUSCULAR | Status: DC
  Filled 2023-02-15: qty 0.5

## 2023-02-15 MED ORDER — ATORVASTATIN CALCIUM 10 MG PO TABS
10.0000 mg | ORAL_TABLET | Freq: Every day | ORAL | Status: DC
  Administered 2023-02-15 – 2023-02-19 (×5): 10 mg via ORAL
  Filled 2023-02-15 (×5): qty 1

## 2023-02-15 MED ORDER — GABAPENTIN 100 MG PO CAPS
100.0000 mg | ORAL_CAPSULE | Freq: Two times a day (BID) | ORAL | Status: DC
  Administered 2023-02-15 – 2023-02-19 (×9): 100 mg via ORAL
  Filled 2023-02-15 (×9): qty 1

## 2023-02-15 NOTE — Care Management Obs Status (Signed)
MEDICARE OBSERVATION STATUS NOTIFICATION   Patient Details  Name: Annette Martin MRN: [id number] Date of Birth: [birth year]   Medicare Observation Status Notification Given:  Yes    Baldemar Lenis, LCSW 02/15/2023, 3:56 PM

## 2023-02-15 NOTE — Progress Notes (Signed)
Summary: Patient is a 67 yo patient with a history of depression, T2DM, schizophrenia, hypothyroidism, HLD, GERD, urinary incontinence, and mild MR who presents from her assisted living facility with left sided weakness and AMS. She was admitted to the Internal Medicine Service on 9/29 for further workup.   Subjective:  Per nursing notes patient screamed out at night; her assisted living facility had made a similar comment when discussing baseline. Of note held some of patient's antipsychotics last night due to patient coming in with AMS. Patient noted to have faster speech fluency, responding to questions appropriately. Patient endorsing headache similar to the one she was admitted with. Will let nurse know for PRN Tylenol. Patient denying any CP, SOB, N/V, diarrhea, or problems with urination.   Objective:  Vital signs in last 24 hours: Vitals:   02/15/23 0421 02/15/23 0424 02/15/23 0425 02/15/23 0731  BP: (!) 178/70   (!) 163/80  Pulse: 89 85 83 85  Resp: 19 19  19   Temp:    97.8 F (36.6 C)  TempSrc:    Oral  SpO2: 94% 99% 98% 96%  Weight:      Height:          Latest Ref Rng & Units 02/15/2023    3:56 AM 02/14/2023   10:42 AM 02/14/2023   10:31 AM  CMP  Glucose 70 - 99 mg/dL 161  096  045   BUN 8 - 23 mg/dL 14  14  13    Creatinine 0.44 - 1.00 mg/dL 4.09  8.11  9.14   Sodium 135 - 145 mmol/L 141  140  138   Potassium 3.5 - 5.1 mmol/L 4.0  4.2  4.0   Chloride 98 - 111 mmol/L 99  102  98   CO2 22 - 32 mmol/L 28   23   Calcium 8.9 - 10.3 mg/dL 9.0   9.1   Total Protein 6.5 - 8.1 g/dL   6.4   Total Bilirubin 0.3 - 1.2 mg/dL   0.3   Alkaline Phos 38 - 126 U/L   86   AST 15 - 41 U/L   18   ALT 0 - 44 U/L   19        Latest Ref Rng & Units 02/15/2023    3:56 AM 02/14/2023   10:42 AM 02/14/2023   10:31 AM  CBC  WBC 4.0 - 10.5 K/uL 6.1   5.1   Hemoglobin 12.0 - 15.0 g/dL 9.6  78.2  9.4   Hematocrit 36.0 - 46.0 % 32.0  30.0  32.5   Platelets 150 - 400 K/uL 229   212       Physical Exam Constitutional: Patient is resting in bed in no acute distress, answering questions appropriately using full sentences CV: RRR, no r/m/g, noted darkened fingertips (Reynaud's) and cold hands Pulmonary/Respiratory: Normal respiratory effort on room air Abdominal: Soft, non-tender, non-distended Neuro: Oriented to self, place, situation; seems to have facial symmetry today and no left facial droop. Of note, patient with inconsistent facial sensation (patient would often indicate the wrong side was touched). Continues to move all limbs spontaneously, with improvement on the left side compared to yesterday.  Psych: Normal mood, flat affect  Assessment/Plan:  Principal Problem:   Acute left-sided weakness Active Problems:   Hyperlipidemia  Patient is a 67 yo patient with a history of depression, T2DM, schizophrenia, hypothyroidism, HLD, GERD, urinary incontinence, and mild MR who presents from her assisted living facility with left sided weakness  and AMS. Per the patient's facility, patient has been requesting more assistance out of preference with tasks she was previously able to perform on her own.    Patient's left sided facial droop and weakness seem improved on exam today. PT/OT eval pending. Etiology may be due to TIA, as imaging ruled out acute stroke, structural causes, infectious workup was negative, and patient did not have any injuries suggesting trauma that may have led to her symptoms. No seizures or epileptiform discharges were seen on the spot EEG. Per the patient's facility, patient takes all her medications as indicated and has had no recent changes. Patient passed a repeat bedside swallow evaluation and oral medications were resumed today.  Plan:  - Monitor for continued improvement - Follow up PT/OT reccs - Patient with outpatient appointment with [facility] scheduled for 11/18    #Anemia Patient with Hgb 10.2 on admission, 9.6 today. No  overt bleeding on today's exam. Iron studies showing iron deficiency, started on oral iron replacement today.  - Monitor daily CBC   #Urinary incontinence History of urinary incontinence, however on home fesoterodine and bethanecol. UA unremarkable, denied dysuria today. External urinary catheter in place. - Monitor    #Raynaud's Observed in both hands, both cold to touch. Per patient, chronic. Will include in discharge for outpatient follow up as patient has polypharmacy and would like to minimize number of medications she is taking if Raynaud's is not bothersome.   Chronic medications #T2DM Chronic. No recent A1C. Takes metformin 500 mg BID at assisted living facility. Will monitor CBGs and place patient on moderate SSI. A1C 7.6, previous A1C 10 years ago. Will make recommendations for outpatient follow up.  #Mood disorders History of schizophrenia, depression, and anxiety. Medications include sertraline, haloperidol, and depakote. Will start sertraline and depakote when patient is able to pass bedside swallow study. Held off on clonazepam and haloperidol on admission, will plan to wean off and decrease dose with recommendations for discharge. Started clonazepam 0.25 mg TID (decreased dose) today.  #GERD Chronic, resumed pantoprazole 40 mg BID.   Diet: CM  DVT prophylaxis: lovenox 40 mg  Code status: Full   Prior to Admission Living Arrangement: [facility]  Anticipated Discharge Location: [facility]  Barriers to Discharge: PT/OT eval  Dispo: Anticipated discharge in approximately 1 day(s).   Philomena Doheny, MD, PGY-1 02/15/2023, 8:22 AM Pager: [phone] After 5pm on weekdays and 1pm on weekends: On Call pager [phone]

## 2023-02-15 NOTE — Care Management CC44 (Signed)
Condition Code 44 Documentation Completed  Patient Details  Name: Annette Martin MRN: [id number] Date of Birth: July 30, 1955   Condition Code 44 given:  Yes Patient signature on Condition Code 44 notice:  Yes Documentation of 2 MD's agreement:  Yes Code 44 added to claim:  Yes    Baldemar Lenis, LCSW 02/15/2023, 3:56 PM

## 2023-02-15 NOTE — TOC Initial Note (Signed)
Transition of Care [facility]) - Initial/Assessment Note    Patient Details  Name: Annette Martin MRN: [id number] Date of Birth: August 15, 1955  Transition of Care [facility]) CM/SW Contact:    Baldemar Lenis, LCSW Phone Number: 02/15/2023, 3:57 PM  Clinical Narrative:      CSW spoke with Hessie Diener, patient's legal guardian, to confirm plan to return to [facility] at discharge. Per Hessie Diener, patient is "extremely lazy" and prefers not to have to get up. She can use her legs, but will not even propel herself in her wheelchair and prefers to have someone push her around. CSW discussed recommendation for home health and Hessie Diener does not believe that the patient would participate, does not want to set it up. CSW to follow.             Expected Discharge Plan: Assisted Living Barriers to Discharge: Continued Medical Work up   Patient Goals and CMS Choice Patient states their goals for this hospitalization and ongoing recovery are:: patient unable to participate in goal setting, not fully oriented CMS Medicare.gov Compare Post Acute Care list provided to:: Legal Guardian Choice offered to / list presented to : [facility] POA / Guardian [employer] ownership interest in [facility].provided to:: [facility] POA / Guardian    Expected Discharge Plan and Services     Post Acute Care Choice: Nursing Home Living arrangements for the past 2 months: Assisted Living Facility                                      Prior Living Arrangements/Services Living arrangements for the past 2 months: Assisted Living Facility Lives with:: Facility Resident Patient language and need for interpreter reviewed:: No Do you feel safe going back to the place where you live?: Yes      Need for Family Participation in Patient Care: Yes (Comment) Care giver support system in place?: Yes (comment)   Criminal Activity/Legal Involvement Pertinent to Current Situation/Hospitalization: No - Comment as needed  Activities of Daily  Living   ADL Screening (condition at time of admission) Does the patient have a NEW difficulty with bathing/dressing/toileting/self-feeding that is expected to last >3 days?: Yes (Initiates electronic notice to provider for possible OT consult) Does the patient have a NEW difficulty with getting in/out of bed, walking, or climbing stairs that is expected to last >3 days?: Yes (Initiates electronic notice to provider for possible PT consult) Does the patient have a NEW difficulty with communication that is expected to last >3 days?: Yes (Initiates electronic notice to provider for possible SLP consult) Is the patient deaf or have difficulty hearing?: No Does the patient have difficulty seeing, even when wearing glasses/contacts?: No Does the patient have difficulty concentrating, remembering, or making decisions?: Yes  Permission Sought/Granted Permission sought to share information with : Facility Medical sales representative, Family Supports Permission granted to share information with : Yes, Verbal Permission Granted  Share Information with NAME: Hessie Diener  Permission granted to share info w AGENCY: [employer]  Permission granted to share info w Relationship: Legal guardian     Emotional Assessment   Attitude/Demeanor/Rapport: Unable to Assess Affect (typically observed): Unable to Assess Orientation: : Oriented to Self Alcohol / Substance Use: Not Applicable Psych Involvement: Outpatient Provider  Admission diagnosis:  Encephalopathy acute [G93.40] Acute left-sided weakness [R53.1] Anemia, unspecified type [D64.9] Patient Active Problem List   Diagnosis Date Noted   Anemia 02/15/2023  Acute left-sided weakness 02/14/2023   Schizophrenia (HCC) 04/05/2018   Mental retardation 12/05/2012   Diabetes mellitus (HCC) 05/28/2011   Hypothyroidism 05/28/2011   Hyperlipidemia 05/28/2011   PCP:  Galvin Proffer, MD Pharmacy:   [facility] [address] [place] - [address] [place] [address] Phone: [phone] Fax: [phone]  [facility] - [place], [place] - [address] [place] [address] Phone: [phone] Fax: [phone]  [facility] - [address] [place], [place] - [address] [place] [address] Phone: [phone] Fax: [phone]  [facility] [phone] - [place], [place] - [address] AT [facility] [address] [place]  [address] Phone: [phone] Fax: [phone]     Social Determinants of Health (SDOH) Social History: SDOH Screenings   Food Insecurity: Food Insecurity Present (02/15/2023)  Housing: Low Risk  (02/15/2023)  Transportation Needs: No Transportation Needs (02/15/2023)  Utilities: Not At Risk (02/15/2023)  Alcohol Screen: Low Risk  (04/05/2018)  Tobacco Use: Low Risk  (02/14/2023)   SDOH Interventions:     Readmission Risk Interventions     No data to display

## 2023-02-15 NOTE — Evaluation (Signed)
Occupational Therapy Evaluation Patient Details Name: Annette Martin MRN: [id number] DOB: May 30, 1955 Today's Date: 02/15/2023   History of Present Illness 67yo female who presented to the ED from her ALF with L sided weakness and AMS, also had fall out of bed the day before admission. Imaging negative for acute CVA. Admitted for further w/u. PMH DM, HTN, hypoxemia, mild MR, neuroleptic malignant syndrome, rhabdomyolysis, schizophrenia, thyroid disease   Clinical Impression   Pt needing assist at baseline with ADLs and uses w/c for mobility, pt from [facility], reports was working with therapy. Pt needing min-max A for ADLs, min-mod A +2 for bed mobility and sit to stand transfer with 2 person HHA. Pt becoming dizzy and needing to return to supine. Pt presenting with impairments listed below, will follow acutely. Recommend HHOT at ALF upon d/c.       If plan is discharge home, recommend the following: Two people to help with walking and/or transfers;A lot of help with bathing/dressing/bathroom;Assistance with cooking/housework;Assistance with feeding;Assist for transportation;Supervision due to cognitive status;Help with stairs or ramp for entrance;Direct supervision/assist for medications management;Direct supervision/assist for financial management    Functional Status Assessment  Patient has had a recent decline in their functional status and demonstrates the ability to make significant improvements in function in a reasonable and predictable amount of time.  Equipment Recommendations  Other (comment) (defer)    Recommendations for Other Services PT consult     Precautions / Restrictions Precautions Precautions: Fall;Other (comment) Precaution Comments: L weakness Restrictions Weight Bearing Restrictions: No      Mobility Bed Mobility Overal bed mobility: Needs Assistance Bed Mobility: Supine to Sit, Sit to Supine     Supine to sit: Mod assist, HOB elevated, +2 for  physical assistance Sit to supine: +2 for safety/equipment, Max assist   General bed mobility comments: assist with use of bed pad to scoot to EOB    Transfers Overall transfer level: Needs assistance Equipment used: 2 person hand held assist Transfers: Sit to/from Stand Sit to Stand: Mod assist, Min assist, +2 physical assistance                  Balance Overall balance assessment: History of Falls, Needs assistance Sitting-balance support: Feet supported, Bilateral upper extremity supported Sitting balance-Leahy Scale: Poor Sitting balance - Comments: L lean but able to self correct with cues Postural control: Left lateral lean Standing balance support: Bilateral upper extremity supported, During functional activity Standing balance-Leahy Scale: Poor                             ADL either performed or assessed with clinical judgement   ADL Overall ADL's : Needs assistance/impaired Eating/Feeding: Minimal assistance   Grooming: Sitting;Minimal assistance   Upper Body Bathing: Moderate assistance   Lower Body Bathing: Maximal assistance   Upper Body Dressing : Moderate assistance   Lower Body Dressing: Maximal assistance   Toilet Transfer: Minimal assistance;Moderate assistance;+2 for physical assistance   Toileting- Clothing Manipulation and Hygiene: Maximal assistance       Functional mobility during ADLs: Minimal assistance;Moderate assistance;+2 for physical assistance       Vision   Additional Comments: will further assess, pt keeping eyes closed frequently, can perform finger to nose x3 with mild undershooting     Perception Perception: Not tested       Praxis Praxis: Not tested       Pertinent Vitals/Pain Pain Assessment Pain Assessment: Faces  Pain Location: BLEs Pain Descriptors / Indicators: Discomfort Pain Intervention(s): Limited activity within patient's tolerance, Monitored during session, Repositioned      Extremity/Trunk Assessment Upper Extremity Assessment Upper Extremity Assessment: Generalized weakness (mild L weakness but functional for BADL)   Lower Extremity Assessment Lower Extremity Assessment: Defer to PT evaluation   Cervical / Trunk Assessment Cervical / Trunk Assessment: Normal   Communication Communication Communication: No apparent difficulties Cueing Techniques: Verbal cues;Tactile cues   Cognition Arousal: Lethargic Behavior During Therapy: Flat affect, WFL for tasks assessed/performed Overall Cognitive Status: History of cognitive impairments - at baseline                                 General Comments: cognitive deficits at baseline, follows commands with incr time, aware she is at the hospital but is unable to state why     General Comments  reports dizziness and needing to return to supine, dizziness improved once returned to supine    Exercises     Shoulder Instructions      Home Living Family/patient expects to be discharged to:: Assisted living                                 Additional Comments: [facility]      Prior Functioning/Environment Prior Level of Function : History of Falls (last six months);Patient poor historian/Family not available             Mobility Comments: usually uses a WC, "some people help me" unable to describe transfer method ADLs Comments: gets help from staff for ADLs but self feeds/brushes teeth        OT Problem List: Decreased strength;Decreased range of motion;Decreased activity tolerance;Impaired balance (sitting and/or standing);Decreased coordination;Decreased cognition;Decreased safety awareness;Cardiopulmonary status limiting activity      OT Treatment/Interventions: Self-care/ADL training;Therapeutic exercise;Energy conservation;DME and/or AE instruction;Therapeutic activities;Patient/family education;Visual/perceptual remediation/compensation;Balance  training;Cognitive remediation/compensation    OT Goals(Current goals can be found in the care plan section) Acute Rehab OT Goals Patient Stated Goal: none stated OT Goal Formulation: With patient Time For Goal Achievement: 03/01/23 Potential to Achieve Goals: Good ADL Goals Pt Will Perform Upper Body Dressing: with min assist;sitting Pt Will Perform Lower Body Dressing: with min assist;sit to/from stand;bed level;sitting/lateral leans Pt Will Transfer to Toilet: with min assist;ambulating;regular height toilet Additional ADL Goal #1: pt will perform bed mobility with CGA in prep for ADLs  OT Frequency: Min 1X/week    Co-evaluation PT/OT/SLP Co-Evaluation/Treatment: Yes Reason for Co-Treatment: Complexity of the patient's impairments (multi-system involvement);To address functional/ADL transfers;For patient/therapist safety PT goals addressed during session: Mobility/safety with mobility OT goals addressed during session: ADL's and self-care      AM-PAC OT "6 Clicks" Daily Activity     Outcome Measure Help from another person eating meals?: A Little Help from another person taking care of personal grooming?: A Little Help from another person toileting, which includes using toliet, bedpan, or urinal?: A Lot Help from another person bathing (including washing, rinsing, drying)?: A Lot Help from another person to put on and taking off regular upper body clothing?: A Lot Help from another person to put on and taking off regular lower body clothing?: A Lot 6 Click Score: 14   End of Session Nurse Communication: Mobility status  Activity Tolerance: Patient tolerated treatment well Patient left: in bed;with call bell/phone within reach;with bed alarm  set  OT Visit Diagnosis: Unsteadiness on feet (R26.81);Other abnormalities of gait and mobility (R26.89);Muscle weakness (generalized) (M62.81)                Time: 6578-4696 OT Time Calculation (min): 14 min Charges:  OT General  Charges $OT Visit: 1 Visit OT Evaluation $OT Eval Moderate Complexity: 1 Mod  Jovee Dettinger K, OTD, OTR/L SecureChat Preferred Acute Rehab [phone]   Carver Fila Koonce 02/15/2023, 3:14 PM

## 2023-02-15 NOTE — ED Notes (Signed)
ED TO INPATIENT HANDOFF REPORT  ED Nurse Name and Phone #: [phone]  S Name/Age/Gender Annette Martin 67 y.o. female Room/Bed: 042C/042C  Code Status   Code Status: Full Code  Home/SNF/Other Assistant Living Patient oriented to: self, place, time, and situation Is this baseline? Yes   Triage Complete: Triage complete  Chief Complaint Acute left-sided weakness [R53.1]  Triage Note Pt arrives via [facility] from [place] [facility] [place] with LSN 1800 last night. Pt with facial droop, left sided weakness and lethargy.    Allergies No Known Allergies  Level of Care/Admitting Diagnosis ED Disposition     ED Disposition  Admit   Condition  --   Comment  Hospital Area: [facility] [[id number]]  Level of Care: Med-Surg [16]  May admit patient to [facility] or [facility] if equivalent level of care is available:: No  Covid Evaluation: Asymptomatic - no recent exposure (last 10 days) testing not required  Diagnosis: Acute left-sided weakness [360757]  Admitting Physician: Gwenevere Abbot [[id number]]  Attending Physician: Ginnie Smart [[id number]]  Certification:: I certify this patient will need inpatient services for at least 2 midnights  Expected Medical Readiness: 02/16/2023          B Medical/Surgery History Past Medical History:  Diagnosis Date   Depression    Diabetes mellitus    type 2   GERD (gastroesophageal reflux disease)    Hypertension    Hypoxemia    Mental retardation    Neuroleptic malignant syndrome    Rhabdomyolysis    Schizophrenia (HCC)    Thyroid disease    Vitamin D deficient osteomalacia    Past Surgical History:  Procedure Laterality Date   COLONOSCOPY  2010   MANN     A IV Location/Drains/Wounds Patient Lines/Drains/Airways Status     Active Line/Drains/Airways     Name Placement date Placement time Site Days   Peripheral IV 02/14/23 20 G Anterior;Left Forearm 02/14/23  1037  Forearm  1   Peripheral  IV 02/14/23 20 G Anterior;Right Forearm 02/14/23  1220  Forearm  1            Intake/Output Last 24 hours No intake or output data in the 24 hours ending 02/15/23 0735  Labs/Imaging Results for orders placed or performed during the hospital encounter of 02/14/23 (from the past 48 hour(s))  CBG monitoring, ED     Status: Abnormal   Collection Time: 02/14/23  9:41 AM  Result Value Ref Range   Glucose-Capillary 264 (H) 70 - 99 mg/dL    Comment: Glucose reference range applies only to samples taken after fasting for at least 8 hours.   Comment 1 Notify RN    Comment 2 Document in Chart   Ethanol     Status: None   Collection Time: 02/14/23 10:03 AM  Result Value Ref Range   Alcohol, Ethyl (B) <10 <10 mg/dL    Comment: (NOTE) Lowest detectable limit for serum alcohol is 10 mg/dL.  For medical purposes only. Performed at [facility], [address], [place], [place] [address]   I-stat chem 8, ED     Status: Abnormal   Collection Time: 02/14/23 10:04 AM  Result Value Ref Range   Sodium 135 135 - 145 mmol/L   Potassium 7.4 (HH) 3.5 - 5.1 mmol/L   Chloride 103 98 - 111 mmol/L   BUN 19 8 - 23 mg/dL   Creatinine, Ser 8.46 0.44 - 1.00 mg/dL   Glucose,  Bld 274 (H) 70 - 99 mg/dL    Comment: Glucose reference range applies only to samples taken after fasting for at least 8 hours.   Calcium, Ion 0.99 (L) 1.15 - 1.40 mmol/L   TCO2 26 22 - 32 mmol/L   Hemoglobin 10.2 (L) 12.0 - 15.0 g/dL   HCT 57.8 (L) 46.9 - 62.9 %   Comment NOTIFIED PHYSICIAN   SARS Coronavirus 2 by RT PCR (hospital order, performed in [facility] hospital lab) *cepheid single result test* Anterior Nasal Swab     Status: None   Collection Time: 02/14/23 10:25 AM   Specimen: Anterior Nasal Swab  Result Value Ref Range   SARS Coronavirus 2 by RT PCR NEGATIVE NEGATIVE    Comment: Performed at [facility], [address], [place], [place] [address]  Urinalysis, Routine w reflex microscopic -Urine, Clean  Catch     Status: Abnormal   Collection Time: 02/14/23 10:31 AM  Result Value Ref Range   Color, Urine YELLOW YELLOW   APPearance CLEAR CLEAR   Specific Gravity, Urine >1.046 (H) 1.005 - 1.030   pH 6.0 5.0 - 8.0   Glucose, UA 150 (A) NEGATIVE mg/dL   Hgb urine dipstick NEGATIVE NEGATIVE   Bilirubin Urine NEGATIVE NEGATIVE   Ketones, ur 5 (A) NEGATIVE mg/dL   Protein, ur NEGATIVE NEGATIVE mg/dL   Nitrite NEGATIVE NEGATIVE   Leukocytes,Ua NEGATIVE NEGATIVE    Comment: Performed at [facility], [address] [place], [place] [address]  CK     Status: None   Collection Time: 02/14/23 10:31 AM  Result Value Ref Range   Total CK 178 38 - 234 U/L    Comment: Performed at [facility], [address] [place], [place] [address]  Protime-INR     Status: None   Collection Time: 02/14/23 10:31 AM  Result Value Ref Range   Prothrombin Time 13.3 11.4 - 15.2 seconds   INR 1.0 0.8 - 1.2    Comment: (NOTE) INR goal varies based on device and disease states. Performed at [facility], [address] [place], [place] [address]   APTT     Status: Abnormal   Collection Time: 02/14/23 10:31 AM  Result Value Ref Range   aPTT 21 (L) 24 - 36 seconds    Comment: Performed at [facility], [address], [place], [place] [address]  CBC with Differential/Platelet     Status: Abnormal   Collection Time: 02/14/23 10:31 AM  Result Value Ref Range   WBC 5.1 4.0 - 10.5 K/uL   RBC 4.06 3.87 - 5.11 MIL/uL   Hemoglobin 9.4 (L) 12.0 - 15.0 g/dL   HCT 42.5 (L) 95.6 - 38.7 %   MCV 80.0 80.0 - 100.0 fL   MCH 23.2 (L) 26.0 - 34.0 pg   MCHC 28.9 (L) 30.0 - 36.0 g/dL   RDW 56.4 (H) 33.2 - 95.1 %   Platelets 212 150 - 400 K/uL   nRBC 0.0 0.0 - 0.2 %   Neutrophils Relative % 72 %   Neutro Abs 3.7 1.7 - 7.7 K/uL   Lymphocytes Relative 16 %   Lymphs Abs 0.8 0.7 - 4.0 K/uL   Monocytes Relative 10 %   Monocytes Absolute 0.5 0.1 - 1.0 K/uL   Eosinophils Relative 2 %   Eosinophils  Absolute 0.1 0.0 - 0.5 K/uL   Basophils Relative 0 %   Basophils Absolute 0.0 0.0 - 0.1 K/uL  Immature Granulocytes 0 %   Abs Immature Granulocytes 0.02 0.00 - 0.07 K/uL    Comment: Performed at [facility], [address] [place], [place] [address]  Comprehensive metabolic panel     Status: Abnormal   Collection Time: 02/14/23 10:31 AM  Result Value Ref Range   Sodium 138 135 - 145 mmol/L   Potassium 4.0 3.5 - 5.1 mmol/L   Chloride 98 98 - 111 mmol/L   CO2 23 22 - 32 mmol/L   Glucose, Bld 235 (H) 70 - 99 mg/dL    Comment: Glucose reference range applies only to samples taken after fasting for at least 8 hours.   BUN 13 8 - 23 mg/dL   Creatinine, Ser 4.69 0.44 - 1.00 mg/dL   Calcium 9.1 8.9 - 62.9 mg/dL   Total Protein 6.4 (L) 6.5 - 8.1 g/dL   Albumin 3.2 (L) 3.5 - 5.0 g/dL   AST 18 15 - 41 U/L   ALT 19 0 - 44 U/L   Alkaline Phosphatase 86 38 - 126 U/L   Total Bilirubin 0.3 0.3 - 1.2 mg/dL   GFR, Estimated >52 >84 mL/min    Comment: (NOTE) Calculated using the CKD-EPI Creatinine Equation (2021)    Anion gap 17 (H) 5 - 15    Comment: Performed at [facility], [address], [place], [place] [address]  I-stat chem 8, ed     Status: Abnormal   Collection Time: 02/14/23 10:42 AM  Result Value Ref Range   Sodium 140 135 - 145 mmol/L   Potassium 4.2 3.5 - 5.1 mmol/L   Chloride 102 98 - 111 mmol/L   BUN 14 8 - 23 mg/dL   Creatinine, Ser 0.10 0.44 - 1.00 mg/dL   Glucose, Bld 272 (H) 70 - 99 mg/dL    Comment: Glucose reference range applies only to samples taken after fasting for at least 8 hours.   Calcium, Ion 1.08 (L) 1.15 - 1.40 mmol/L   TCO2 24 22 - 32 mmol/L   Hemoglobin 10.2 (L) 12.0 - 15.0 g/dL   HCT 53.6 (L) 64.4 - 03.4 %  POC occult blood, ED Provider will collect     Status: None   Collection Time: 02/14/23 12:07 PM  Result Value Ref Range   Fecal Occult Bld NEGATIVE NEGATIVE  Vitamin B12     Status: None   Collection Time: 02/14/23 12:21 PM   Result Value Ref Range   Vitamin B-12 403 180 - 914 pg/mL    Comment: (NOTE) This assay is not validated for testing neonatal or myeloproliferative syndrome specimens for Vitamin B12 levels. Performed at [facility], [address], [place], [place] [address]   Folate     Status: None   Collection Time: 02/14/23 12:21 PM  Result Value Ref Range   Folate 7.4 >5.9 ng/mL    Comment: Performed at [facility], [address] [place], [place] [address]  Iron and TIBC     Status: Abnormal   Collection Time: 02/14/23 12:21 PM  Result Value Ref Range   Iron 24 (L) 28 - 170 ug/dL   TIBC 564 (H) 332 - 951 ug/dL   Saturation Ratios 5 (L) 10.4 - 31.8 %   UIBC 490 ug/dL    Comment: Performed at [facility], [address] [place], [place] [address]  Ferritin     Status: Abnormal   Collection Time: 02/14/23 12:21 PM  Result Value Ref  Range   Ferritin 8 (L) 11 - 307 ng/mL    Comment: Performed at [facility], [address], [place], [place] [address]  Reticulocytes     Status: Abnormal   Collection Time: 02/14/23 12:21 PM  Result Value Ref Range   Retic Ct Pct 1.5 0.4 - 3.1 %   RBC. 4.15 3.87 - 5.11 MIL/uL   Retic Count, Absolute 61.0 19.0 - 186.0 K/uL   Immature Retic Fract 21.7 (H) 2.3 - 15.9 %    Comment: Performed at [facility], [address], [place], [place] [address]  Ammonia     Status: None   Collection Time: 02/14/23 12:40 PM  Result Value Ref Range   Ammonia 23 9 - 35 umol/L    Comment: HEMOLYSIS AT THIS LEVEL MAY AFFECT RESULT Performed at [facility], [address], [place], [place] [address]   Valproic acid level     Status: Abnormal   Collection Time: 02/14/23 12:40 PM  Result Value Ref Range   Valproic Acid Lvl 36 (L) 50.0 - 100.0 ug/mL    Comment: Performed at [facility], [address] [place], [place] [address]  HIV Antibody (routine testing w rflx)     Status: None   Collection Time: 02/14/23  3:45 PM   Result Value Ref Range   HIV Screen 4th Generation wRfx Non Reactive Non Reactive    Comment: Performed at [facility], [address] [place], [place] [address]  CBG monitoring, ED     Status: Abnormal   Collection Time: 02/14/23  4:48 PM  Result Value Ref Range   Glucose-Capillary 116 (H) 70 - 99 mg/dL    Comment: Glucose reference range applies only to samples taken after fasting for at least 8 hours.  CBG monitoring, ED     Status: Abnormal   Collection Time: 02/14/23  6:18 PM  Result Value Ref Range   Glucose-Capillary 125 (H) 70 - 99 mg/dL    Comment: Glucose reference range applies only to samples taken after fasting for at least 8 hours.  CBG monitoring, ED     Status: Abnormal   Collection Time: 02/14/23 10:10 PM  Result Value Ref Range   Glucose-Capillary 175 (H) 70 - 99 mg/dL    Comment: Glucose reference range applies only to samples taken after fasting for at least 8 hours.  Hemoglobin A1c     Status: Abnormal   Collection Time: 02/15/23  3:56 AM  Result Value Ref Range   Hgb A1c MFr Bld 7.6 (H) 4.8 - 5.6 %    Comment: (NOTE) Pre diabetes:          5.7%-6.4%  Diabetes:              >6.4%  Glycemic control for   <7.0% adults with diabetes    Mean Plasma Glucose 171.42 mg/dL    Comment: Performed at [facility], [address] [place], [place] [address]  CBC     Status: Abnormal   Collection Time: 02/15/23  3:56 AM  Result Value Ref Range   WBC 6.1 4.0 - 10.5 K/uL   RBC 4.00 3.87 - 5.11 MIL/uL   Hemoglobin 9.6 (L) 12.0 - 15.0 g/dL   HCT 52.8 (L) 41.3 - 24.4 %   MCV 80.0 80.0 - 100.0 fL   MCH 24.0 (L) 26.0 - 34.0 pg   MCHC 30.0 30.0 - 36.0 g/dL   RDW 01.0 (H)  11.5 - 15.5 %   Platelets 229 150 - 400 K/uL   nRBC 0.0 0.0 - 0.2 %    Comment: Performed at [facility], [address] [place], [place] [address]  Basic metabolic panel     Status: Abnormal   Collection Time: 02/15/23  3:56 AM  Result Value Ref Range   Sodium 141 135 - 145  mmol/L   Potassium 4.0 3.5 - 5.1 mmol/L   Chloride 99 98 - 111 mmol/L   CO2 28 22 - 32 mmol/L   Glucose, Bld 176 (H) 70 - 99 mg/dL    Comment: Glucose reference range applies only to samples taken after fasting for at least 8 hours.   BUN 14 8 - 23 mg/dL   Creatinine, Ser 2.95 0.44 - 1.00 mg/dL   Calcium 9.0 8.9 - 28.4 mg/dL   GFR, Estimated >13 >24 mL/min    Comment: (NOTE) Calculated using the CKD-EPI Creatinine Equation (2021)    Anion gap 14 5 - 15    Comment: Performed at [facility], [address] [place], [place] [address]   MR BRAIN WO CONTRAST  Result Date: 02/14/2023 CLINICAL DATA:  67 year old female code stroke presentation this morning with facial droop, left side weakness, lethargy. Last seen normal 1800 hours. EXAM: MRI HEAD WITHOUT CONTRAST TECHNIQUE: Multiplanar, multiecho pulse sequences of the brain and surrounding structures were obtained without intravenous contrast. COMPARISON:  CT head, CTA, CTP today.  Brain MRI 10/01/2022. FINDINGS: Brain: No restricted diffusion or evidence of acute infarction. Chronic nonspecific ventriculomegaly is stable since May. No transependymal edema. There is some evidence of hyperdynamic flow at the cerebral aqueduct. No midline shift, mass effect, evidence of mass lesion, extra-axial collection or acute intracranial hemorrhage. Cervicomedullary junction and pituitary are within normal limits. Stable gray and white matter signal throughout the brain. Scattered mild for age subcortical white matter T2 and FLAIR hyperintensity in a nonspecific configuration. No cortical encephalomalacia or chronic cerebral blood products identified. Vascular: Major intracranial vascular flow voids are stable. Skull and upper cervical spine: Negative. Visualized bone marrow signal is within normal limits. Sinuses/Orbits: Stable, negative. Other: Visible internal auditory structures appear normal. Negative visible scalp and face. IMPRESSION: 1. No acute  intracranial abnormality. 2. Chronic nonspecific ventriculomegaly. Consider NPH in the appropriate clinical setting. 3. Mild for age nonspecific white matter signal changes. Electronically Signed   By: Odessa Fleming M.D.   On: 02/14/2023 12:07   DG Chest Portable 1 View  Result Date: 02/14/2023 CLINICAL DATA:  Altered mental status EXAM: PORTABLE CHEST - 1 VIEW COMPARISON:  12/21/2022 FINDINGS: Mild coarse interstitial opacities in the lung bases. Heart size and mediastinal contours are within normal limits. No effusion. Visualized bones unremarkable. IMPRESSION: Mild bibasilar interstitial opacities. Electronically Signed   By: Corlis Leak M.D.   On: 02/14/2023 10:59   CT ANGIO HEAD NECK W WO CM W PERF (CODE STROKE)  Result Date: 02/14/2023 CLINICAL DATA:  67 year old female code stroke presentation. Altered mental status and slurred speech. EXAM: CT ANGIOGRAPHY HEAD AND NECK CT PERFUSION BRAIN TECHNIQUE: Multidetector CT imaging of the head and neck was performed using the standard protocol during bolus administration of intravenous contrast. Multiplanar CT image reconstructions and MIPs were obtained to evaluate the vascular anatomy. Carotid stenosis measurements (when applicable) are obtained utilizing NASCET criteria, using the distal internal carotid diameter as the denominator. Multiphase CT imaging of the brain was performed following IV bolus contrast injection. Subsequent parametric perfusion maps were  calculated using RAPID software. RADIATION DOSE REDUCTION: This exam was performed according to the departmental dose-optimization program which includes automated exposure control, adjustment of the mA and/or kV according to patient size and/or use of iterative reconstruction technique. CONTRAST:  OMNIPAQUE IOHEXOL 350 MG/ML SOLN COMPARISON:  Head CT 0954 hours today.  Brain MRI 10/01/2022. FINDINGS: CT Brain Perfusion Findings: ASPECTS: 10 CBF (<30%) Volume: 0mL.  No parameter abnormality. Perfusion  (Tmax>6.0s) volume: 0mL.  No parameter abnormality. Mismatch Volume: Not applicable Infarction Location:Not applicable CTA NECK Skeleton: Right TMJ degeneration. Mild for age cervical spine degeneration. No acute osseous abnormality identified. Upper chest: Mild respiratory motion, otherwise negative. Other neck: No acute finding. Aortic arch: Mildly bovine arch configuration. No arch atherosclerosis. Right carotid system: Negative brachiocephalic artery. Tortuous proximal right CCA. Negative right carotid bifurcation. Tortuous cervical right ICA but no plaque or stenosis. Left carotid system: Mildly tortuous left CCA. Negative left carotid bifurcation. No plaque or stenosis. Vertebral arteries: Proximal right subclavian artery is normal. The right vertebral artery has a shared origin with the thyrocervical trunk (series 9, image 273. Right vertebral artery is patent and within normal limits to the skull base. Normal proximal left subclavian artery. Normal left vertebral artery origin. Dominant left vertebral artery. Tortuous left V1 segment. Left vertebral artery is patent and normal to the skull base. CTA HEAD Posterior circulation: Patent distal vertebral arteries and vertebrobasilar junction with no plaque or stenosis. Dominant left V4 segment. Normal PICA origins. Patent basilar artery without stenosis. Normal SCA and PCA origins. Small posterior communicating arteries. Bilateral PCA branches are within normal limits, up to mild P2 segment irregularity. Anterior circulation: Both ICA siphons are patent. Minimal if any ICA siphon plaque and no stenosis. Normal posterior communicating artery origins. Patent carotid termini. Normal MCA and ACA origins. Mild A1 tortuosity. Normal anterior communicating artery and bilateral ACA branches. Left MCA M1 segment bifurcates early without stenosis. Mild crossing venous vessel artifact at the left MCA bifurcation. Right MCA M1 segment and bifurcation are patent without  stenosis. Bilateral MCA branches are within normal limits. Venous sinuses: Patent. Anatomic variants: Dominant left vertebral artery. Review of the MIP images confirms the above findings IMPRESSION: Negative CT Perfusion. And CTA is negative for LVO, atherosclerosis, or stenosis. Mild arterial tortuosity in the head and neck. These results were communicated to Dr. Lovett Sox at 10:28 am on 02/14/2023 by text page via the [facility] messaging system. Electronically Signed   By: Odessa Fleming M.D.   On: 02/14/2023 10:29   EEG adult  Result Date: 02/14/2023 Charlsie Quest, MD     02/14/2023  4:27 PM Patient Name: Annette Martin MRN: [id number] Epilepsy Attending: Charlsie Quest Referring Physician/Provider: Elmer Picker, NP Date: 02/14/2023 Duration: 23.52 mins Patient history: 67yo F with facial droop, left sided weakness and lethargy. EEG to evaluate for seizure Level of alertness: Awake AEDs during EEG study: None Technical aspects: This EEG study was done with scalp electrodes positioned according to the 10-20 International system of electrode placement. Electrical activity was reviewed with band pass filter of 1-70Hz , sensitivity of 7 uV/mm, display speed of 55mm/sec with a 60Hz  notched filter applied as appropriate. EEG data were recorded continuously and digitally stored.  Video monitoring was available and reviewed as appropriate. Description: The posterior dominant rhythm consists of 8 Hz activity of moderate voltage (25-35 uV) seen predominantly in posterior head regions, symmetric and reactive to eye opening and eye closing. Physiologic photic driving was seen during photic stimulation. Hyperventilation  and photic stimulation were not performed.   IMPRESSION: This study is within normal limits. No seizures or epileptiform discharges were seen throughout the recording. A normal interictal EEG does not exclude  the diagnosis of epilepsy. Charlsie Quest   CT HEAD CODE STROKE WO CONTRAST  Result Date:  02/14/2023 CLINICAL DATA:  Code stroke. 67 year old female neurologic deficit (not otherwise specified at the time of this report). EXAM: CT HEAD WITHOUT CONTRAST TECHNIQUE: Contiguous axial images were obtained from the base of the skull through the vertex without intravenous contrast. RADIATION DOSE REDUCTION: This exam was performed according to the departmental dose-optimization program which includes automated exposure control, adjustment of the mA and/or kV according to patient size and/or use of iterative reconstruction technique. COMPARISON:  Brain MRI 10/01/2022 head CT 12/23/2022. FINDINGS: Brain: Stable cerebral volume. Stable nonspecific ventriculomegaly. No evidence of transependymal edema. No midline shift, mass effect, evidence of mass lesion, intracranial hemorrhage or evidence of cortically based acute infarction. Vascular: No suspicious intracranial vascular hyperdensity. Skull: Osteopenia.  No acute osseous abnormality identified. Sinuses/Orbits: Visualized paranasal sinuses and mastoids are stable and well aerated. Other: No gaze deviation, acute orbit or scalp soft tissue finding. ASPECTS [facility] Stroke Program Early CT Score) Total score (0-10 with 10 being normal): 10 IMPRESSION: 1. No acute cortically based infarct or acute intracranial hemorrhage identified. ASPECTS 10. 2. Stable chronic nonspecific ventriculomegaly. 3. These results were communicated to Dr. Viviann Spare at 9:58 am on 02/14/2023 by text page via the [facility] messaging system. Electronically Signed   By: Odessa Fleming M.D.   On: 02/14/2023 09:59    Pending Labs Unresulted Labs (From admission, onward)     Start     Ordered   02/14/23 1547  Urinalysis, w/ Reflex to Culture (Infection Suspected) -Urine, Clean Catch  (Urine Labs)  Once,   R       Question:  Specimen Source  Answer:  Urine, Clean Catch   02/14/23 1546            Vitals/Pain Today's Vitals   02/15/23 0425 02/15/23 0727 02/15/23 0730 02/15/23 0731  BP:    (!)  163/80  Pulse: 83   85  Resp:    19  Temp:    97.8 F (36.6 C)  TempSrc:    Oral  SpO2: 98%   96%  Weight:      Height:      PainSc:  0-No pain 0-No pain     Isolation Precautions No active isolations  Medications Medications  enoxaparin (LOVENOX) injection 40 mg (40 mg Subcutaneous Given 02/14/23 1544)  acetaminophen (TYLENOL) tablet 650 mg (has no administration in time range)    Or  acetaminophen (TYLENOL) suppository 650 mg (has no administration in time range)  senna-docusate (Senokot-S) tablet 1 tablet (has no administration in time range)  ferrous sulfate tablet 325 mg (has no administration in time range)  insulin aspart (novoLOG) injection 0-15 Units ( Subcutaneous Not Given 02/14/23 1657)  insulin aspart (novoLOG) injection 0-5 Units ( Subcutaneous Not Given 02/14/23 2221)  pantoprazole (PROTONIX) EC tablet 40 mg (40 mg Oral Given 02/14/23 2227)  divalproex (DEPAKOTE) DR tablet 250 mg (250 mg Oral Given 02/14/23 2227)  sertraline (ZOLOFT) tablet 200 mg (has no administration in time range)  sodium chloride flush (NS) 0.9 % injection 3 mL (3 mLs Intravenous Given 02/14/23 1039)  iohexol (OMNIPAQUE) 350 MG/ML injection 100 mL (100 mLs Intravenous Contrast Given 02/14/23 1008)  sodium chloride 0.9 % bolus 1,000 mL (0 mLs  Intravenous Stopped 02/14/23 1410)    Mobility Patient said she cannot walk, I did not get up for me and dont know, because of the stroke     Focused Assessments Neuro Assessment Handoff:  Swallow screen pass? Yes  Cardiac Rhythm: Normal sinus rhythm NIH Stroke Scale  Dizziness Present: Yes Headache Present: Yes Interval: Shift assessment Level of Consciousness (1a.)   : Not alert, but arousable by minor stimulation to obey, answer, or respond LOC Questions (1b. )   : Answers both questions correctly LOC Commands (1c. )   : Performs both tasks correctly Best Gaze (2. )  : Normal Visual (3. )  : No visual loss Facial Palsy (4. )    : Minor  paralysis Motor Arm, Left (5a. )   : No drift Motor Arm, Right (5b. ) : No drift Motor Leg, Left (6a. )  : No drift Motor Leg, Right (6b. ) : No drift Limb Ataxia (7. ): Absent Sensory (8. )  : Mild-to-moderate sensory loss, patient feels pinprick is less sharp or is dull on the affected side, or there is a loss of superficial pain with pinprick, but patient is aware of being touched Best Language (9. )  : No aphasia Dysarthria (10. ): Mild-to-moderate dysarthria, patient slurs at least some words and, at worst, can be understood with some difficulty Extinction/Inattention (11.)   : No Abnormality Complete NIHSS TOTAL: 4 Last date known well: 02/13/23 Last time known well: 1800 Neuro Assessment: Exceptions to WDL Neuro Checks:   Initial (02/14/23 1000)  Has TPA been given? No If patient is a Neuro Trauma and patient is going to OR before floor call report to 4N Charge nurse: [phone] or [phone]   R Recommendations: See Admitting Provider Note  Report given to:   Additional Notes: patient is weak and is a high fall risk. Neuro check every 2 hours on was done at 0727. NIH Scale was a 4

## 2023-02-15 NOTE — Evaluation (Signed)
Physical Therapy Evaluation Patient Details Name: Annette Martin MRN: [id number] DOB: March 11, 1956 Today's Date: 02/15/2023  History of Present Illness  66yo female who presented to the ED from her ALF with L sided weakness and AMS, also had fall out of bed the day before admission. Imaging negative for acute CVA. Admitted for further w/u. PMH DM, HTN, hypoxemia, mild MR, neuroleptic malignant syndrome, rhabdomyolysis, schizophrenia, thyroid disease  Clinical Impression  Pt received in bed, cooperative and appropriate with PT but a bit of a difficult historian- unable to really determine PLOF or prior of level of assist from staff at her ALF. Mobilized fairly with +2 assist today, kept eyes closed throughout mobility. Tolerated standing for less than a minute, then became dizzy and we returned to supine. Left in bed with all needs met and positioned to comfort. At this point recommend return to her ALF however if this is not possible for any reason, would certainly need SNF.         If plan is discharge home, recommend the following: Two people to help with walking and/or transfers;A lot of help with bathing/dressing/bathroom;Supervision due to cognitive status;A lot of help with walking and/or transfers;Two people to help with bathing/dressing/bathroom   Can travel by private vehicle        Equipment Recommendations Other (comment) (defer to next venue)  Recommendations for Other Services       Functional Status Assessment Patient has had a recent decline in their functional status and demonstrates the ability to make significant improvements in function in a reasonable and predictable amount of time.     Precautions / Restrictions Precautions Precautions: Fall;Other (comment) Precaution Comments: L weakness Restrictions Weight Bearing Restrictions: No      Mobility  Bed Mobility Overal bed mobility: Needs Assistance Bed Mobility: Supine to Sit, Sit to Supine     Supine to  sit: Mod assist, HOB elevated, +2 for physical assistance Sit to supine: +2 for safety/equipment, Max assist   General bed mobility comments: did initiate getting to EOB with cues from PT, ultimately needed ModA to help swing hips around and bring trunk to upright; MaxAx2 to control movement/manage trunk and LEs for return to bed    Transfers Overall transfer level: Needs assistance Equipment used: 2 person hand held assist Transfers: Sit to/from Stand Sit to Stand: Mod assist, Min assist           General transfer comment: ModA x1 and MinA of second person to come to standing position, able to tolerate standing for less than 1 minute before sitting back down    Ambulation/Gait               General Gait Details: unable at baseline, per pt and chart looks to have been non-ambulatory/transfers to [facility] only  Stairs            Wheelchair Mobility     Tilt Bed    Modified Rankin (Stroke Patients Only)       Balance Overall balance assessment: History of Falls, Needs assistance Sitting-balance support: Feet supported, Bilateral upper extremity supported Sitting balance-Leahy Scale: Poor Sitting balance - Comments: L lean but able to self correct with cues Postural control: Left lateral lean Standing balance support: Bilateral upper extremity supported, During functional activity Standing balance-Leahy Scale: Poor                               Pertinent Vitals/Pain Pain  Assessment Pain Assessment: 0-10 Pain Location: "a lot of pain in both legs"/didn't give number out of 10; BLEs Pain Descriptors / Indicators: Discomfort Pain Intervention(s): Limited activity within patient's tolerance, Monitored during session    Home Living Family/patient expects to be discharged to:: Assisted living ([facility])                   Additional Comments: unable to provide clear history of PLOF/DME or what PT/OT was working on with her in ALF/assist levels  from staff    Prior Function Prior Level of Function : History of Falls (last six months);Patient poor historian/Family not available             Mobility Comments: usually uses a WC, "some people help me" unable to describe transfer method ADLs Comments: gets help from staff for ADLs but self feeds/brushes teeth     Extremity/Trunk Assessment   Upper Extremity Assessment Upper Extremity Assessment: Defer to OT evaluation    Lower Extremity Assessment Lower Extremity Assessment: Generalized weakness    Cervical / Trunk Assessment Cervical / Trunk Assessment: Normal  Communication   Communication Communication: No apparent difficulties Cueing Techniques: Verbal cues;Tactile cues;Gestural cues  Cognition Arousal: Lethargic Behavior During Therapy: Flat affect, WFL for tasks assessed/performed Overall Cognitive Status: History of cognitive impairments - at baseline                                 General Comments: mild MR at baseline, interacted appropriately with rehab staff but kept eyes closed during eval. Often would answer questions by repeating what PT or OT said if a single option was given, unsure of baseline cognition. Mildly impulsive        General Comments General comments (skin integrity, edema, etc.): became dizzy sitting EOB after standing BP cuff not available so returned to supine    Exercises     Assessment/Plan    PT Assessment Patient needs continued PT services  PT Problem List Decreased strength;Decreased balance;Decreased cognition;Decreased mobility;Obesity;Decreased activity tolerance;Decreased coordination;Decreased safety awareness       PT Treatment Interventions DME instruction;Therapeutic activities;Gait training;Therapeutic exercise;Patient/family education;Balance training;Wheelchair mobility training;Functional mobility training;Neuromuscular re-education;Manual techniques    PT Goals (Current goals can be found in the  Care Plan section)  Acute Rehab PT Goals Patient Stated Goal: not stated PT Goal Formulation: Patient unable to participate in goal setting Time For Goal Achievement: 03/01/23 Potential to Achieve Goals: Fair    Frequency Min 1X/week     Co-evaluation PT/OT/SLP Co-Evaluation/Treatment: Yes Reason for Co-Treatment: Complexity of the patient's impairments (multi-system involvement);To address functional/ADL transfers;For patient/therapist safety PT goals addressed during session: Mobility/safety with mobility         AM-PAC PT "6 Clicks" Mobility  Outcome Measure Help needed turning from your back to your side while in a flat bed without using bedrails?: A Lot Help needed moving from lying on your back to sitting on the side of a flat bed without using bedrails?: Total Help needed moving to and from a bed to a chair (including a wheelchair)?: Total Help needed standing up from a chair using your arms (e.g., wheelchair or bedside chair)?: Total Help needed to walk in hospital room?: Total Help needed climbing 3-5 steps with a railing? : Total 6 Click Score: 7    End of Session   Activity Tolerance: Patient tolerated treatment well;Patient limited by fatigue Patient left: in bed;with call bell/phone within reach;with  bed alarm set Nurse Communication: Mobility status PT Visit Diagnosis: Unsteadiness on feet (R26.81);Muscle weakness (generalized) (M62.81);Other abnormalities of gait and mobility (R26.89)    Time: 0102-7253 PT Time Calculation (min) (ACUTE ONLY): 13 min   Charges:   PT Evaluation $PT Eval Moderate Complexity: 1 Mod   PT General Charges $$ ACUTE PT VISIT: 1 Visit       Nedra Hai, PT, DPT 02/15/23 2:34 PM

## 2023-02-15 NOTE — Progress Notes (Signed)
SLP Cancellation Note  Patient Details Name: Annette Martin MRN: [id number] DOB: 1956/01/06   Cancelled treatment:       Reason Eval/Treat Not Completed: Other (comment) Order received for swallow eval after pt initially did not pass a swallow screen. She has since passed a repeat swallow screen and a diet has been initiated. SLP to defer swallow eval - please reorder if there are concerns or if speech/language evaluation is needed.     Mahala Menghini., M.A. CCC-SLP Acute Rehabilitation Services Office [phone]  Secure chat preferred  02/15/2023, 12:47 PM

## 2023-02-16 DIAGNOSIS — R531 Weakness: Secondary | ICD-10-CM | POA: Diagnosis not present

## 2023-02-16 DIAGNOSIS — I10 Essential (primary) hypertension: Secondary | ICD-10-CM

## 2023-02-16 DIAGNOSIS — D649 Anemia, unspecified: Secondary | ICD-10-CM

## 2023-02-16 LAB — BASIC METABOLIC PANEL
Anion gap: 12 (ref 5–15)
BUN: 16 mg/dL (ref 8–23)
CO2: 26 mmol/L (ref 22–32)
Calcium: 8.8 mg/dL — ABNORMAL LOW (ref 8.9–10.3)
Chloride: 98 mmol/L (ref 98–111)
Creatinine, Ser: 0.93 mg/dL (ref 0.44–1.00)
GFR, Estimated: >60 mL/min (ref >60)
Glucose, Bld: 176 mg/dL — ABNORMAL HIGH (ref 70–99)
Potassium: 4 mmol/L (ref 3.5–5.1)
Sodium: 136 mmol/L (ref 135–145)

## 2023-02-16 LAB — CBC
HCT: 29.6 % — ABNORMAL LOW (ref 36.0–46.0)
Hemoglobin: 9 g/dL — ABNORMAL LOW (ref 12.0–15.0)
MCH: 23.1 pg — ABNORMAL LOW (ref 26.0–34.0)
MCHC: 30.4 g/dL (ref 30.0–36.0)
MCV: 75.9 fL — ABNORMAL LOW (ref 80.0–100.0)
Platelets: 246 10*3/uL (ref 150–400)
RBC: 3.9 MIL/uL (ref 3.87–5.11)
RDW: 16.6 % — ABNORMAL HIGH (ref 11.5–15.5)
WBC: 5.2 10*3/uL (ref 4.0–10.5)
nRBC: 0 % (ref 0.0–0.2)

## 2023-02-16 LAB — GLUCOSE, CAPILLARY
Glucose-Capillary: 184 mg/dL — ABNORMAL HIGH (ref 70–99)
Glucose-Capillary: 174 mg/dL — ABNORMAL HIGH (ref 70–99)
Glucose-Capillary: 154 mg/dL — ABNORMAL HIGH (ref 70–99)

## 2023-02-16 LAB — URINE CULTURE: Culture: NO GROWTH

## 2023-02-16 MED ORDER — HALOPERIDOL 0.5 MG PO TABS
5.0000 mg | ORAL_TABLET | Freq: Two times a day (BID) | ORAL | Status: DC
  Administered 2023-02-16 – 2023-02-19 (×7): 5 mg via ORAL
  Filled 2023-02-16 (×7): qty 10

## 2023-02-16 NOTE — Progress Notes (Signed)
Summary: Patient is a 67 yo patient with a history of depression, T2DM, schizophrenia, hypothyroidism, HLD, GERD, urinary incontinence, and mild MR who presents from her assisted living facility with left sided weakness and AMS. She was admitted to the Internal Medicine Service on 9/29 for further workup.   Subjective:  No overnight events reported. Per patient she had no difficulties sleeping. Patient using phrases and sentences to answer questions, ask questions, and make comments. She denied any CP, SOB, N/V, or acute pain.   Objective:  Vital signs in last 24 hours: Vitals:   02/16/23 0405 02/16/23 0719 02/16/23 1116 02/16/23 1517  BP: (!) 156/67 (!) 161/83 (!) 140/86 132/83  Pulse: 90 94 88 87  Resp: 16 20 19 18   Temp: 97.6 F (36.4 C) 98.9 F (37.2 C) 98.7 F (37.1 C) 98.7 F (37.1 C)  TempSrc:  Oral Oral Oral  SpO2: 94% 95% 98% 100%  Weight:      Height:          Latest Ref Rng & Units 02/16/2023    5:12 AM 02/15/2023    3:56 AM 02/14/2023   10:42 AM  CMP  Glucose 70 - 99 mg/dL 161  096  045   BUN 8 - 23 mg/dL 16  14  14    Creatinine 0.44 - 1.00 mg/dL 4.09  8.11  9.14   Sodium 135 - 145 mmol/L 136  141  140   Potassium 3.5 - 5.1 mmol/L 4.0  4.0  4.2   Chloride 98 - 111 mmol/L 98  99  102   CO2 22 - 32 mmol/L 26  28    Calcium 8.9 - 10.3 mg/dL 8.8  9.0         Latest Ref Rng & Units 02/16/2023    5:12 AM 02/15/2023    3:56 AM 02/14/2023   10:42 AM  CBC  WBC 4.0 - 10.5 K/uL 5.2  6.1    Hemoglobin 12.0 - 15.0 g/dL 9.0  9.6  78.2   Hematocrit 36.0 - 46.0 % 29.6  32.0  30.0   Platelets 150 - 400 K/uL 246  229       Physical Exam Constitutional: Patient in bed in no acute distress, cooperative with exam CV: RRR, np r/m/g, no LE edema Pulmonary: Normal respiratory effort on room air  Neuro: facial symmetry, facial sensation more consistent (patient has difficulty with R vs L), observed slight tremble/shake on right hand, able to move all limbs  spontaneously Abdominal: Soft, non-tender, non-distended Psych: Normal mood, flat affect  Assessment/Plan:  Principal Problem:   Acute left-sided weakness Active Problems:   Hyperlipidemia   Anemia  Patient is a 67 yo patient with a history of depression, T2DM, schizophrenia, hypothyroidism, HLD, GERD, urinary incontinence, and mild MR who presented from her assisted living facility with left sided weakness and AMS.   Etiology of patient's left sided facial droop and left sided weakness most likely due to TIA as patient appears to to be back at her baseline. Patient evaluated by OT and PT, who helped determine that patient would benefit from skilled interventions at either ALF or SNF. ALF shared concerns regarding ability to assist patient as she works on deconditioning. Social work is following to help with discharge.   Plan:  - Monitor for continued improvement - Follow up with social work regarding discharge - Patient with outpatient appointment with [facility] scheduled for 11/18    #Anemia Patient with Hgb 10.2 on admission, 9.0  today down from 9.6. No overt bleeding on today's exam. Iron studies showing iron deficiency, started on oral iron replacement 9/30. - Monitor daily CBC   #Urinary incontinence History of urinary incontinence, however on home fesoterodine and bethanecol. Have not resumed on admission as one treats overactive bladder and one promotes bladder emptying. UA unremarkable, continues to deny. External urinary catheter in place. - Monitor    #Raynaud's Observed in both hands on 9/30. Per patient, chronic. Will include in discharge for outpatient follow up.  Chronic medications #T2DM Chronic. A1C on admission 7.6. Takes metformin 500 mg BID at assisted living facility. Will monitor CBGs and place patient on moderate SSI. Resumed gabapentin 100 mg BID 9/30 for neuropathic pain. Will make recommendations for outpatient follow up.  #Mood  disorders History of schizophrenia, depression, and anxiety. Medications include sertraline, haloperidol, clonazepam, and depakote. Held off on clonazepam and haloperidol on admission, will plan to wean off and decrease dose with recommendations for discharge. Started depakote 250 mg TID 9/29, clonazepam 0.25 mg TID (decreased dose) 9/30, sertraline 200 mg 9/30, and haloperidol 5 mg BID (reduced dose) 10/1.  #GERD Chronic, resumed pantoprazole 40 mg BID.   Diet: CM  DVT prophylaxis: lovenox 40 mg  Code status: Full   Prior to Admission Living Arrangement: [facility]  Anticipated Discharge Location: [facility]  Barriers to Discharge: LCSW to discuss with ALF Dispo: Anticipated discharge in approximately 1 day(s).   Philomena Doheny, MD, PGY-1 02/16/2023, 4:00 PM Pager: [phone] After 5pm on weekdays and 1pm on weekends: On Call pager [phone]

## 2023-02-16 NOTE — TOC Progression Note (Signed)
Transition of Care [facility]) - Progression Note    Patient Details  Name: Annette Martin MRN: [id number] Date of Birth: 06-07-1955  Transition of Care [facility]) CM/SW Contact  Baldemar Lenis, [place] Phone Number: 02/16/2023, 4:21 PM  Clinical Narrative:   CSW spoke with [facility] this morning about patient returning, obtained fax number and discussed patient's needs. CSW later updated by MDs that patient's ALF was now asking for SNF placement; return to ALF was recommended by PT and OT, and legal guardian is not interested in rehabilitation for the patient because she does not participate. CSW attempted to contact [facility] to discuss, unable to reach anyone. CSW spoke with legal guardian, Hessie Diener, that [facility] was requesting SNF. Hessie Diener again reiterated that he believes that rehab is pointless because she doesn't want to participate. Per Hessie Diener, she has been weak for months and this is not new. CSW to continue to attempt to reach ALF about returning.    Expected Discharge Plan: Assisted Living Barriers to Discharge: Continued Medical Work up  Expected Discharge Plan and Services     Post Acute Care Choice: Nursing Home Living arrangements for the past 2 months: Assisted Living Facility                                       Social Determinants of Health (SDOH) Interventions SDOH Screenings   Food Insecurity: Food Insecurity Present (02/15/2023)  Housing: Low Risk  (02/15/2023)  Transportation Needs: No Transportation Needs (02/15/2023)  Utilities: Not At Risk (02/15/2023)  Alcohol Screen: Low Risk  (04/05/2018)  Tobacco Use: Low Risk  (02/14/2023)    Readmission Risk Interventions     No data to display

## 2023-02-16 NOTE — Hospital Course (Addendum)
Patient is a 67 yo patient with a history of depression, T2DM, schizophrenia, hypothyroidism, HLD, GERD, urinary incontinence, and mild MR who presented from her assisted living facility with left sided weakness and AMS. She was admitted to the Internal Medicine Service on 9/29 for further workup.   Acute left-sided weakness Patient presented to the ED with symptoms concerning for stroke and neurology was consulted.  Vitals include BP 162/74, HR 90, afebrile, on room air. CBG 264. Potassium noted to be 7.4 on admission, likely spurious lab. Hgb, 10.2, Cr. 0.80. FOBT and ethanol screen negative. EKG NSR, HR 89, prolonged Qtc 485. CT head, CT angio, MR brain not concerning for acute ischemic or hemorrhaging stroke, CXR with mild bibasilar interstitial opacities. COVID PCR negative. Repeat K 4.0, 4.2. Given Na 1000 mL bolus.   On initial exam patient with observable left facial droop, drool pooling on side of mouth, speaking at a slow rate with dysarthric speech. Demonstrated decreased sensation on left along V2, V3 distribution. Patient was able to follow simple prompts with verbal cues and repetitions. Patient able to spontaneously lift and move R and L lower extremities and R upper extremity. Unable to lift left arm against gravity without assistance, observed wiggling R hand fingers.   Differential diagnosis on admission included stroke vs infection vs NPH vs TIA vs trauma vs medication related encephalopathy vs seizures. No evidence of CVA on imaging or new seizure on EEG. Low suspicion for infectious etiology, or trauma.   Left facial droop, slurred and slowed speech, and left sided weakness improved day after admission. Patient appeared to be back at her baseline. Etiology of patient's left sided facial droop, left sided weakness, and confusion most likely due to TIA as patient appears to be back at her baseline. Of note, multiple neuropsych medications were held on admission that may have been  contributing to her symptoms. Patient with outpatient appointment with [facility] scheduled for 11/18 can help determine if further work up is needed for other etiologies, including NPH or medication related encephalopathy, if symptoms recur or there is an acute change in function.   Patient was evaluated by OT and PT, who helped determine that patient would benefit from skilled interventions at either ALF or SNF. Social work followed to help with discharge of patient back to ALF.    Elevated blood pressure Patient with blood pressures 160s-170s/60s-100s. Not on an antihypertensive at ALF. Renal function stable Cr 0.93, GFR > 60. Started Losartan 12.5 mg daily 10/2. BP minimally improved on low dose, increased to 25 mg daily on 10/3. Serum Cr. 0.94, GFR > 60 on discharge.     Lower back pain Most likely musculoskeletal, ordered lidocaine patch. Patient endorsed improvement in pain with patch.   Anemia Patient with Hgb 10.2 on admission, 10.1 on 10/3. No overt bleeding on discharge physical exam. Iron studies showing iron deficiency, started on oral iron replacement 9/30. Recommend continuing on discharge.    Urinary incontinence History of urinary incontinence, however on home fesoterodine and bethanecol. Have not resumed on admission as one treats overactive bladder and one promotes bladder emptying. UA unremarkable, denied dysuria. External urinary catheter worn throughout hospitalization without issue. Will recommend medication review of urinary incontinence by ALF at discharge.  Raynaud's Observed in both hands on 9/30. Per patient, feels like hands are cold frequently, seems to be chronic. Will include in discharge for outpatient follow up.  T2DM Chronic. Hgb A1C on admission 7.6. Takes metformin 500 mg BID at assisted  living facility. Monitored daily CBGs, which trended up over time. Changed from moderate SSI to resistant SSI on 10/4. Resumed gabapentin 100 mg BID 9/30 for  neuropathic pain. Patient may need to go up on metformin for better control, follow up outpatient.   Mood disorders History of schizophrenia, depression, and anxiety. Medications include sertraline, haloperidol, clonazepam, and depakote. Held off on clonazepam and haloperidol on admission, will plan to wean off and decrease dose with recommendations for discharge. Started depakote 250 mg TID 9/29, clonazepam 0.25 mg TID (decreased dose) 9/30, sertraline 200 mg 9/30, and haloperidol 5 mg BID (reduced dose) 10/1. Patient was observed to have right upper extremity tremor at rest, per patient this has been present for some time. Resumed amantadine 100 mg BID on 10/4 as tremor was impacting ability to feed herself.   GERD Chronic, resumed pantoprazole 40 mg BID on admission.

## 2023-02-17 DIAGNOSIS — I10 Essential (primary) hypertension: Secondary | ICD-10-CM

## 2023-02-17 DIAGNOSIS — R531 Weakness: Secondary | ICD-10-CM | POA: Diagnosis not present

## 2023-02-17 LAB — GLUCOSE, CAPILLARY
Glucose-Capillary: 152 mg/dL — ABNORMAL HIGH (ref 70–99)
Glucose-Capillary: 166 mg/dL — ABNORMAL HIGH (ref 70–99)
Glucose-Capillary: 191 mg/dL — ABNORMAL HIGH (ref 70–99)
Glucose-Capillary: 178 mg/dL — ABNORMAL HIGH (ref 70–99)

## 2023-02-17 LAB — CBC
HCT: 32.3 % — ABNORMAL LOW (ref 36.0–46.0)
Hemoglobin: 9.8 g/dL — ABNORMAL LOW (ref 12.0–15.0)
MCH: 23.5 pg — ABNORMAL LOW (ref 26.0–34.0)
MCHC: 30.3 g/dL (ref 30.0–36.0)
MCV: 77.5 fL — ABNORMAL LOW (ref 80.0–100.0)
Platelets: 252 10*3/uL (ref 150–400)
RBC: 4.17 MIL/uL (ref 3.87–5.11)
RDW: 16.5 % — ABNORMAL HIGH (ref 11.5–15.5)
WBC: 5.2 10*3/uL (ref 4.0–10.5)
nRBC: 0 % (ref 0.0–0.2)

## 2023-02-17 MED ORDER — LIDOCAINE 5 % EX PTCH
1.0000 | MEDICATED_PATCH | CUTANEOUS | Status: DC
  Administered 2023-02-17 – 2023-02-19 (×3): 1 via TRANSDERMAL
  Filled 2023-02-17 (×3): qty 1

## 2023-02-17 MED ORDER — LOSARTAN POTASSIUM 25 MG PO TABS
12.5000 mg | ORAL_TABLET | Freq: Every day | ORAL | Status: DC
  Administered 2023-02-17: 12.5 mg via ORAL
  Filled 2023-02-17 (×2): qty 1

## 2023-02-17 NOTE — TOC Progression Note (Addendum)
Transition of Care [facility]) - Progression Note    Patient Details  Name: Annette Martin MRN: [id number] Date of Birth: [birth year]  Transition of Care [facility]) CM/SW Contact  Baldemar Lenis, [place] Phone Number: 02/17/2023, 9:50 AM  Clinical Narrative:   CSW spoke with Boyd Kerbs at [facility] to discuss concerns with patient returning. Per Boyd Kerbs, the patient has been able to stand with a one assist and transfer to her wheelchair, and they got report yesterday that the patient was not able to even stand up. CSW explained PT note that patient could stand but was a +2 assist, and ALF cannot manage that; patient will need to be a +1 assist to return. Per Boyd Kerbs, patient can be unmotivated; said to mention the names "Vicky and Boyd Kerbs" to see if that will encourage the patient to try harder. ALF will come visit the patient this afternoon to assess her to return.  CSW spoke with PT about discussion, and patient worked with PT and was a +1 assist. CSW to contact [facility] back after PT note is in to update them on patient's care needs.  UPDATE: CSW spoke with [facility] and provided PT note. Boyd Kerbs and staff from [facility] came to visit with patient, and worked with RN to see how patient could transfer. Patient was max assist with staff to return to bed from the chair, they are unable to accommodate that until she gets a little stronger. Boyd Kerbs asking for what PT will say again tomorrow and hopeful that she could return and have home health setup. [facility] asking for patient to be setup with [facility] program for PT. CSW sent referral to [facility], awaiting review. CSW to follow.    Expected Discharge Plan: Assisted Living Barriers to Discharge: Continued Medical Work up  Expected Discharge Plan and Services     Post Acute Care Choice: Nursing Home Living arrangements for the past 2 months: Assisted Living Facility                                        Social Determinants of Health (SDOH) Interventions SDOH Screenings   Food Insecurity: Food Insecurity Present (02/15/2023)  Housing: Low Risk  (02/15/2023)  Transportation Needs: No Transportation Needs (02/15/2023)  Utilities: Not At Risk (02/15/2023)  Alcohol Screen: Low Risk  (04/05/2018)  Tobacco Use: Low Risk  (02/14/2023)    Readmission Risk Interventions     No data to display

## 2023-02-17 NOTE — Progress Notes (Signed)
Physical Therapy Treatment Patient Details Name: Annette Martin MRN: [id number] DOB: 10/12/1955 Today's Date: 02/17/2023   History of Present Illness 66yo female who presented to the ED from her ALF with L sided weakness and AMS, also had fall out of bed the day before admission. Imaging negative for acute CVA. Admitted for further w/u. PMH DM, HTN, hypoxemia, mild MR, neuroleptic malignant syndrome, rhabdomyolysis, schizophrenia, thyroid disease    PT Comments  Patient alert and much better able to participate today. Able to progress to EOB with min assist and stand then step to chair with CGA. Noted family does not think pt will participate with skilled PT at facility and now that she is close to her baseline, do not feel HHPT is indicated. Anticipate return to ALF.     If plan is discharge home, recommend the following: A lot of help with bathing/dressing/bathroom;Supervision due to cognitive status;A little help with walking and/or transfers;Direct supervision/assist for medications management;Direct supervision/assist for financial management;Assist for transportation   Can travel by private vehicle        Equipment Recommendations  None recommended by PT    Recommendations for Other Services       Precautions / Restrictions Precautions Precautions: Fall;Other (comment) Precaution Comments: L weakness Restrictions Weight Bearing Restrictions: No     Mobility  Bed Mobility Overal bed mobility: Needs Assistance Bed Mobility: Supine to Sit     Supine to sit: HOB elevated, Min assist     General bed mobility comments: pt able to come to long-sit without assist, slowly moves LEs over EOB and assist to scoot out to get feet to the floor (bed linens wet making it more difficult to scoot)    Transfers Overall transfer level: Needs assistance Equipment used: 1 person hand held assist Transfers: Sit to/from Stand, Bed to chair/wheelchair/BSC Sit to Stand: Contact guard  assist   Step pivot transfers: Contact guard assist       General transfer comment: pt motivated to get OOB to chair; +1 HHA with pivotal steps bed to chair    Ambulation/Gait               General Gait Details: unable at baseline, per pt and chart looks to have been non-ambulatory/transfers to [facility] only   Stairs             Wheelchair Mobility     Tilt Bed    Modified Rankin (Stroke Patients Only)       Balance Overall balance assessment: History of Falls, Needs assistance Sitting-balance support: Feet supported, No upper extremity supported Sitting balance-Leahy Scale: Fair     Standing balance support: During functional activity, Single extremity supported Standing balance-Leahy Scale: Poor                              Cognition Arousal: Alert Behavior During Therapy: Flat affect, WFL for tasks assessed/performed Overall Cognitive Status: History of cognitive impairments - at baseline                                 General Comments: cognitive deficits at baseline, follows commands with incr time, aware she is at the hospital but is unable to state why        Exercises      General Comments        Pertinent Vitals/Pain Pain Assessment Pain Assessment: No/denies pain  Home Living                          Prior Function            PT Goals (current goals can now be found in the care plan section) Acute Rehab PT Goals Patient Stated Goal: return home Time For Goal Achievement: 03/01/23 Potential to Achieve Goals: Fair Progress towards PT goals: Progressing toward goals    Frequency    Min 1X/week      PT Plan      Co-evaluation              AM-PAC PT "6 Clicks" Mobility   Outcome Measure  Help needed turning from your back to your side while in a flat bed without using bedrails?: A Little Help needed moving from lying on your back to sitting on the side of a flat bed without  using bedrails?: A Little Help needed moving to and from a bed to a chair (including a wheelchair)?: A Little Help needed standing up from a chair using your arms (e.g., wheelchair or bedside chair)?: A Little Help needed to walk in hospital room?: Total Help needed climbing 3-5 steps with a railing? : Total 6 Click Score: 14    End of Session Equipment Utilized During Treatment: Gait belt Activity Tolerance: Patient tolerated treatment well Patient left: with call bell/phone within reach;in chair;with chair alarm set Nurse Communication: Mobility status PT Visit Diagnosis: Unsteadiness on feet (R26.81);Muscle weakness (generalized) (M62.81);Other abnormalities of gait and mobility (R26.89)     Time: 4696-2952 PT Time Calculation (min) (ACUTE ONLY): 17 min  Charges:    $Therapeutic Activity: 8-22 mins PT General Charges $$ ACUTE PT VISIT: 1 Visit                      Jerolyn Center, PT Acute Rehabilitation Services  Office [phone]    Zena Amos 02/17/2023, 10:05 AM

## 2023-02-17 NOTE — Plan of Care (Signed)

## 2023-02-17 NOTE — Progress Notes (Signed)
Summary: Patient is a 67 yo patient with a history of depression, T2DM, schizophrenia, hypothyroidism, HLD, GERD, urinary incontinence, and mild MR who presents from her assisted living facility with left sided weakness and AMS. She was admitted to the Internal Medicine Service on 9/29 for further workup.   Subjective:  No overnight events reported. Patient denied any issues with sleep. Using phrases and sentences to converse appropriately. Denies any CP, SOB, or N/V. Complained of lower back pain, as well as pain around area where seat belt is positioned (belt is for safety while patient sits up in chair to eat breakfast).  Objective:  Vital signs in last 24 hours: Vitals:   02/16/23 2354 02/17/23 0355 02/17/23 0837 02/17/23 1254  BP: (!) 164/65 (!) 174/73 (!) 169/74 (!) 159/86  Pulse: 79 76 81 88  Resp: 18  18 18   Temp:  98 F (36.7 C) 98.4 F (36.9 C) 97.7 F (36.5 C)  TempSrc:  Oral Oral Oral  SpO2: 100% 99% 100% 97%  Weight:      Height:           Latest Ref Rng & Units 02/17/2023    5:01 AM 02/16/2023    5:12 AM 02/15/2023    3:56 AM  CBC  WBC 4.0 - 10.5 K/uL 5.2  5.2  6.1   Hemoglobin 12.0 - 15.0 g/dL 9.8  9.0  9.6   Hematocrit 36.0 - 46.0 % 32.3  29.6  32.0   Platelets 150 - 400 K/uL 252  246  229     Physical Exam Constitutional: Patient is sitting up in chair in no acute distress, cooperative with exam CV: RRR, no r/m/g, no LE edema Pulmonary: Normal respiratory effort on room air Neuro: Facial symmetry, moving all limbs spontaneously, no new focal deficits observed Abdominal: Soft, non-distended, tender around area where belt is placed MSK: Minimal pain on palpation around lower back, no skin changes or injuries observed Psych: Normal mood, flat affect  Assessment/Plan:  Principal Problem:   Acute left-sided weakness Active Problems:   Hyperlipidemia   Anemia   Hypertension  Patient is a 67 yo patient with a history of depression, T2DM, schizophrenia,  hypothyroidism, HLD, GERD, urinary incontinence, and mild MR who presented from her assisted living facility with left sided weakness and AMS.   No facial droop or new focal deficit on exam. Patient stable from yesterday. Discharge pending on ALF assessing patient to help determine safe discharge back to facility.   Plan:  - Monitor for continued improvement - Follow up with social work regarding discharge - Patient with outpatient appointment with [facility] scheduled for 11/18  #Elevated blood pressures Patient with blood pressures 160s-170s/60s-100s. Not on an antihypertensive at ALF. Renal function stable Cr 0.93, GFR > 60. Started Losartan 12.5 mg daily 10/2, if minimal improvement will increase to 25 mg daily.      #Lower back pain Most likely musculoskeletal, ordered lidocaine patch. Will monitor.  #Anemia Patient with Hgb 10.2 on admission, 9.8 up from 9.0 yesterday. No overt bleeding on today's exam. Continue PO iron replacement.  - Monitor daily CBC   #Urinary incontinence History of urinary incontinence, however on home fesoterodine and bethanecol. Have not resumed on admission as one treats overactive bladder and one promotes bladder emptying. UA unremarkable, continues to deny. External urinary catheter in place. - Monitor    #Raynaud's Observed in both hands on 9/30. Per patient, chronic. Will include in discharge for outpatient follow up.  Chronic  conditions:  #T2DM Chronic. A1C on admission 7.6. Takes metformin 500 mg BID at assisted living facility. Will monitor CBGs and place patient on moderate SSI. Resumed gabapentin 100 mg BID 9/30 for neuropathic pain. Will make recommendations for outpatient follow up.   #Mood disorders History of schizophrenia, depression, and anxiety. Medications include sertraline, haloperidol, clonazepam, and depakote. Held off on clonazepam and haloperidol on admission, will plan to wean off and decrease dose with  recommendations for discharge. Started depakote 250 mg TID 9/29, clonazepam 0.25 mg TID (decreased dose) 9/30, sertraline 200 mg 9/30, and haloperidol 5 mg BID (reduced dose) 10/1.   #GERD Chronic, resumed pantoprazole 40 mg BID.   Diet: CM  DVT prophylaxis: lovenox 40 mg  Code status: Full   Prior to Admission Living Arrangement: [facility]  Anticipated Discharge Location: [facility]  Barriers to Discharge: LCSW to discuss with ALF Dispo: Anticipated discharge in approximately 1 day(s).   Philomena Doheny, MD, PGY-1 02/17/2023, 2:46 PM Pager: [phone] After 5pm on weekdays and 1pm on weekends: On Call pager [phone]

## 2023-02-18 DIAGNOSIS — R531 Weakness: Secondary | ICD-10-CM | POA: Diagnosis not present

## 2023-02-18 LAB — BASIC METABOLIC PANEL
Anion gap: 13 (ref 5–15)
BUN: 15 mg/dL (ref 8–23)
CO2: 26 mmol/L (ref 22–32)
Calcium: 8.8 mg/dL — ABNORMAL LOW (ref 8.9–10.3)
Chloride: 97 mmol/L — ABNORMAL LOW (ref 98–111)
Creatinine, Ser: 0.94 mg/dL (ref 0.44–1.00)
GFR, Estimated: >60 mL/min (ref >60)
Glucose, Bld: 168 mg/dL — ABNORMAL HIGH (ref 70–99)
Potassium: 3.9 mmol/L (ref 3.5–5.1)
Sodium: 136 mmol/L (ref 135–145)

## 2023-02-18 LAB — CBC
HCT: 32.9 % — ABNORMAL LOW (ref 36.0–46.0)
Hemoglobin: 10.1 g/dL — ABNORMAL LOW (ref 12.0–15.0)
MCH: 24.3 pg — ABNORMAL LOW (ref 26.0–34.0)
MCHC: 30.7 g/dL (ref 30.0–36.0)
MCV: 79.3 fL — ABNORMAL LOW (ref 80.0–100.0)
Platelets: 253 10*3/uL (ref 150–400)
RBC: 4.15 MIL/uL (ref 3.87–5.11)
RDW: 16.9 % — ABNORMAL HIGH (ref 11.5–15.5)
WBC: 4.3 10*3/uL (ref 4.0–10.5)
nRBC: 0 % (ref 0.0–0.2)

## 2023-02-18 LAB — GLUCOSE, CAPILLARY
Glucose-Capillary: 167 mg/dL — ABNORMAL HIGH (ref 70–99)
Glucose-Capillary: 200 mg/dL — ABNORMAL HIGH (ref 70–99)
Glucose-Capillary: 163 mg/dL — ABNORMAL HIGH (ref 70–99)
Glucose-Capillary: 148 mg/dL — ABNORMAL HIGH (ref 70–99)

## 2023-02-18 MED ORDER — LOSARTAN POTASSIUM 25 MG PO TABS
25.0000 mg | ORAL_TABLET | Freq: Every day | ORAL | Status: DC
  Administered 2023-02-18 – 2023-02-19 (×2): 25 mg via ORAL
  Filled 2023-02-18: qty 1

## 2023-02-18 MED ORDER — POLYETHYLENE GLYCOL 3350 17 G PO PACK
17.0000 g | PACK | Freq: Every day | ORAL | Status: DC
  Administered 2023-02-18 – 2023-02-19 (×2): 17 g via ORAL
  Filled 2023-02-18 (×2): qty 1

## 2023-02-18 NOTE — Progress Notes (Signed)
Summary: Patient is a 67 yo patient with a history of depression, T2DM, schizophrenia, hypothyroidism, HLD, GERD, urinary incontinence, and mild MR who presents from her assisted living facility with left sided weakness and AMS. She was admitted to the Internal Medicine Service on 9/29 for further workup.   Subjective:  No overnight events reported. Patient resting in bed, denies any acute pain. Endorsed sleeping ok last night. Denied any CP, SOB, NV, lower abdominal/suprapubic pain, or dysuria. Patient states lidocaine patch helped with lower back pain, not having it currently. Discussed importance of continuing to work on her strength, as well as spending time in chair next to bed with assistance instead of the bed.   Objective:  Vital signs in last 24 hours: Vitals:   02/18/23 0524 02/18/23 0700 02/18/23 0753 02/18/23 1104  BP: (!) 142/65 (!) 164/76 (!) 149/73 (!) 130/96  Pulse: 73 76 73 97  Resp: 20 19 19 18   Temp: 97.9 F (36.6 C) 98.4 F (36.9 C) 98.2 F (36.8 C) 98.6 F (37 C)  TempSrc: Oral Oral Oral Oral  SpO2: 98% 100% 97% 96%  Weight:      Height:       Physical Exam Constitutional: Patient resting comfortably in bed, in no acute distress, conversing appropriately, cooperative with exam  CV: RRR, no new murmurs on auscultation, no LE Pulmonary: Normal respiratory effort on room air  Neuro: Patient with facial symmetry, alert and oriented, answering questions appropriately, following 1-step verbal prompts, moving all limbs spontaneously, noticeable right sided tremor at rest, no other acute focal deficits observed Abdominal: Soft, non-tender, non-distended MSK: Denied pain on lower back, lidocaine patch in place, denied suprapubic pain  Psych: Normal mood, flat affect  Assessment/Plan:  Principal Problem:   Acute left-sided weakness Active Problems:   Hyperlipidemia   Anemia   Hypertension  Patient is a 67 yo patient with a history of depression, T2DM,  schizophrenia, hypothyroidism, HLD, GERD, urinary incontinence, and mild MR who presented from her assisted living facility with left sided weakness and AMS.    Patient continues to be stable on exam. Discharge pending ALF willing to take patient back at current baseline/functioning based on PT/OT evaluation.    Plan:  - Monitor for continued improvement - Follow up with social work regarding discharge - Patient with outpatient appointment with [facility] scheduled for 11/18   #Elevated blood pressures Patient with blood pressures 160s-170s/60s-100s. Not on an antihypertensive at ALF. Renal function stable Cr 0.94, GFR > 60. Started Losartan 12.5 mg daily 10/2 with minimal improvement, increased to 25 mg daily on 10/03.    #Lower back pain Most likely musculoskeletal, improved with lidocaine patch. Will continue.   #Anemia Patient with Hgb 10.2 on admission, 10.1 today, stable. No overt bleeding on today's exam. Continue PO iron replacement.  - Monitor daily CBC   #Urinary incontinence History of urinary incontinence per chart review, however on home fesoterodine and bethanecol. Held on admission. UA unremarkable, continues to deny dysuria. Using external catheter as needed.  - Monitor    #Raynaud's Observed in both hands on 9/30. Per patient, chronic. Will include in discharge for outpatient follow up.   Chronic conditions:   #T2DM Chronic. A1C on admission 7.6. Takes metformin 500 mg BID at assisted living facility. Will monitor CBGs and place patient on moderate SSI. Resumed gabapentin 100 mg BID 9/30 for neuropathic pain. Will make recommendations for outpatient follow up.    #Mood disorders History of schizophrenia, depression, and anxiety.  Medications include sertraline, haloperidol, clonazepam, and depakote. Held off on clonazepam and haloperidol on admission, will plan to wean off and decrease dose with recommendations for discharge. Started depakote 250 mg  TID 9/29, clonazepam 0.25 mg TID (decreased dose) 9/30, sertraline 200 mg 9/30, and haloperidol 5 mg BID (reduced dose) 10/1.    #GERD Chronic, resumed pantoprazole 40 mg BID.   Diet: CM  DVT prophylaxis: lovenox 40 mg  Code status: Full    Prior to Admission Living Arrangement: Anticipated Discharge Location: Barriers to Discharge: Dispo: Anticipated discharge in approximately 1 day(s).   Philomena Doheny, MD, PGY-1 02/18/2023, 11:17 AM Pager: [phone] After 5pm on weekdays and 1pm on weekends: On Call pager [phone]  *From note entered as H&P earlier today

## 2023-02-18 NOTE — Plan of Care (Signed)
  Problem: Education: Goal: Knowledge of General Education information will improve Description: Including pain rating scale, medication(s)/side effects and non-pharmacologic comfort measures Outcome: Progressing   Problem: Health Behavior/Discharge Planning: Goal: Ability to manage health-related needs will improve Outcome: Progressing   Problem: Clinical Measurements: Goal: Ability to maintain clinical measurements within normal limits will improve Outcome: Progressing Goal: Diagnostic test results will improve Outcome: Progressing   Problem: Activity: Goal: Risk for activity intolerance will decrease Outcome: Progressing   Problem: Coping: Goal: Level of anxiety will decrease Outcome: Progressing   Problem: Elimination: Goal: Will not experience complications related to bowel motility Outcome: Progressing

## 2023-02-18 NOTE — Progress Notes (Signed)
Name: Annette Martin DOB: Apr 01, 1956  Please be advised that the above-named patient will require a short-term nursing home stay -- anticipated 30 days or less for rehabilitation and strengthening. The plan is for return home.

## 2023-02-18 NOTE — TOC Progression Note (Addendum)
Transition of Care [facility]) - Progression Note    Patient Details  Name: Annette Martin MRN: [id number] Date of Birth: 1955/08/06  Transition of [facility]) CM/SW Contact  Baldemar Lenis, [place] Phone Number: 02/18/2023, 3:47 PM  Clinical Narrative:   CSW discussed with OT about patient's participation, and patient did not do well enough for ALF to agree to take her back. CSW updated Hessie Diener, he is in agreement with looking into SNF. CSW completed referral, faxed out for bed offers. CSW spoke with Boyd Kerbs at [facility] to provide update and confirm that patient could not return with current function. CSW to follow.    Expected Discharge Plan: Skilled Nursing Facility Barriers to Discharge: Continued Medical Work up, English as a second language teacher, Engineer, mining)  Expected Discharge Plan and Services     Post Acute Care Choice: Nursing Home Living arrangements for the past 2 months: Assisted Living Facility                                       Social Determinants of Health (SDOH) Interventions SDOH Screenings   Food Insecurity: Food Insecurity Present (02/15/2023)  Housing: Low Risk  (02/15/2023)  Transportation Needs: No Transportation Needs (02/15/2023)  Utilities: Not At Risk (02/15/2023)  Alcohol Screen: Low Risk  (04/05/2018)  Tobacco Use: Low Risk  (02/14/2023)    Readmission Risk Interventions     No data to display

## 2023-02-18 NOTE — H&P (Deleted)
Summary: Patient is a 67 yo patient with a history of depression, T2DM, schizophrenia, hypothyroidism, HLD, GERD, urinary incontinence, and mild MR who presents from her assisted living facility with left sided weakness and AMS. She was admitted to the Internal Medicine Service on 9/29 for further workup.   Subjective:  No overnight events reported. Patient resting in bed, denies any acute pain. Endorsed sleeping ok last night. Denied any CP, SOB, NV, lower abdominal/suprapubic pain, or dysuria. Patient states lidocaine patch helped with lower back pain, not having it currently. Discussed importance of continuing to work on her strength, as well as spending time in chair next to bed with assistance instead of the bed.   Objective:  Vital signs in last 24 hours: Vitals:   02/17/23 2326 02/18/23 0524 02/18/23 0700 02/18/23 0753  BP: (!) 137/93 (!) 142/65 (!) 164/76 (!) 149/73  Pulse: 79 73 76 73  Resp: 20 20 19 19   Temp: 97.9 F (36.6 C) 97.9 F (36.6 C) 98.4 F (36.9 C) 98.2 F (36.8 C)  TempSrc: Oral Oral Oral Oral  SpO2: 95% 98% 100% 97%  Weight:      Height:          Latest Ref Rng & Units 02/18/2023    8:06 AM 02/17/2023    5:01 AM 02/16/2023    5:12 AM  CBC  WBC 4.0 - 10.5 K/uL 4.3  5.2  5.2   Hemoglobin 12.0 - 15.0 g/dL 16.1  9.8  9.0   Hematocrit 36.0 - 46.0 % 32.9  32.3  29.6   Platelets 150 - 400 K/uL 253  252  246        Latest Ref Rng & Units 02/18/2023    8:06 AM 02/16/2023    5:12 AM 02/15/2023    3:56 AM  BMP  Glucose 70 - 99 mg/dL 096  045  409   BUN 8 - 23 mg/dL 15  16  14    Creatinine 0.44 - 1.00 mg/dL 8.11  9.14  7.82   Sodium 135 - 145 mmol/L 136  136  141   Potassium 3.5 - 5.1 mmol/L 3.9  4.0  4.0   Chloride 98 - 111 mmol/L 97  98  99   CO2 22 - 32 mmol/L 26  26  28    Calcium 8.9 - 10.3 mg/dL 8.8  8.8  9.0     Physical Exam Constitutional: Patient resting comfortably in bed, in no acute distress, conversing appropriately, cooperative with exam  CV:  RRR, no new murmurs on auscultation, no LE Pulmonary: Normal respiratory effort on room air  Neuro: Patient with facial symmetry, alert and oriented, answering questions appropriately, following 1-step verbal prompts, moving all limbs spontaneously, noticeable right sided tremor at rest, no other acute focal deficits observed Abdominal: Soft, non-tender, non-distended MSK: Denied pain on lower back, lidocaine patch in place, denied suprapubic pain  Psych: Normal mood, flat affect  Assessment/Plan:  Principal Problem:   Acute left-sided weakness Active Problems:   Hyperlipidemia   Anemia   Hypertension  Patient is a 67 yo patient with a history of depression, T2DM, schizophrenia, hypothyroidism, HLD, GERD, urinary incontinence, and mild MR who presented from her assisted living facility with left sided weakness and AMS.   Patient continues to be stable on exam. Discharge pending ALF willing to take patient back at current baseline/functioning based on PT/OT evaluation.   Plan:  - Monitor for continued improvement - Follow up with social work regarding discharge - Patient  with outpatient appointment with [facility] scheduled for 11/18  #Elevated blood pressures Patient with blood pressures 160s-170s/60s-100s. Not on an antihypertensive at ALF. Renal function stable Cr 0.94, GFR > 60. Started Losartan 12.5 mg daily 10/2 with minimal improvement, increased to 25 mg daily on 10/03.   #Lower back pain Most likely musculoskeletal, improved with lidocaine patch. Will continue.  #Anemia Patient with Hgb 10.2 on admission, 10.1 today, stable. No overt bleeding on today's exam. Continue PO iron replacement.  - Monitor daily CBC   #Urinary incontinence History of urinary incontinence per chart review, however on home fesoterodine and bethanecol. Held on admission. UA unremarkable, continues to deny dysuria. Using external catheter as needed.  - Monitor     #Raynaud's Observed in both hands on 9/30. Per patient, chronic. Will include in discharge for outpatient follow up.  Chronic conditions:  #T2DM Chronic. A1C on admission 7.6. Takes metformin 500 mg BID at assisted living facility. Will monitor CBGs and place patient on moderate SSI. Resumed gabapentin 100 mg BID 9/30 for neuropathic pain. Will make recommendations for outpatient follow up.   #Mood disorders History of schizophrenia, depression, and anxiety. Medications include sertraline, haloperidol, clonazepam, and depakote. Held off on clonazepam and haloperidol on admission, will plan to wean off and decrease dose with recommendations for discharge. Started depakote 250 mg TID 9/29, clonazepam 0.25 mg TID (decreased dose) 9/30, sertraline 200 mg 9/30, and haloperidol 5 mg BID (reduced dose) 10/1.   #GERD Chronic, resumed pantoprazole 40 mg BID.   Diet: CM  DVT prophylaxis: lovenox 40 mg  Code status: Full   Prior to Admission Living Arrangement: [facility]  Anticipated Discharge Location: [facility]  Barriers to Discharge: LCSW to discuss with ALF Dispo: Anticipated discharge in approximately 1 day(s).   Philomena Doheny, MD, PGY-1 02/18/2023, 10:02 AM Pager: [phone] After 5pm on weekdays and 1pm on weekends: On Call pager [phone]

## 2023-02-18 NOTE — Progress Notes (Signed)
Occupational Therapy Treatment Patient Details Name: Annette Martin MRN: [id number] DOB: 25-Mar-1956 Today's Date: 02/18/2023   History of present illness 67yo female who presented to the ED from her ALF with L sided weakness and AMS, also had fall out of bed the day before admission. Imaging negative for acute CVA. Admitted for further w/u. PMH DM, HTN, hypoxemia, mild MR, neuroleptic malignant syndrome, rhabdomyolysis, schizophrenia, thyroid disease   OT comments  This 67 yo female seen today to see if she was Min A +1 or better for transfers for her to be able to go back to ALF where she resides. This afternoon she was overall Mod A for bed mobility, initially Max A sit<>stand due to posterior lean and then Mod-min A (varied) for transfer to/from bed/recliner. She will continue to benefit from acute OT with follow up from continued inpatient follow up therapy, <3 hours/day.       If plan is discharge home, recommend the following:  A lot of help with walking and/or transfers;A lot of help with bathing/dressing/bathroom;Assistance with cooking/housework;Help with stairs or ramp for entrance;Assistance with feeding;Assist for transportation;Direct supervision/assist for financial management;Direct supervision/assist for medications management   Equipment Recommendations  None recommended by OT       Precautions / Restrictions Precautions Precautions: Fall Restrictions Weight Bearing Restrictions: No       Mobility Bed Mobility Overal bed mobility: Needs Assistance Bed Mobility: Supine to Sit     Supine to sit: Mod assist     General bed mobility comments: moved legs 3/4 over to the side of the bed and then said she needed A (so A to move legs around more and use of bed pad to scoot her ot EOB)--Mod A overall.    Transfers Overall transfer level: Needs assistance Equipment used: 1 person hand held assist Transfers: Sit to/from Stand Sit to Stand: Mod assist Stand pivot  transfers: Mod assist         General transfer comment: posterior lean today with sit>stand and somewhat intermittently with stepping to/from bed/recliner with therapist standing in front of her holding onto gait gelt and patient holding onto therapists arms.     Balance Overall balance assessment: Needs assistance Sitting-balance support: No upper extremity supported, Feet supported Sitting balance-Leahy Scale: Fair     Standing balance support: During functional activity, Single extremity supported, Bilateral upper extremity supported Standing balance-Leahy Scale: Zero Standing balance comment: initially zero because of posterior lean--progressing to poor                           ADL either performed or assessed with clinical judgement   ADL Overall ADL's : Needs assistance/impaired Eating/Feeding: Minimal assistance Eating/Feeding Details (indicate cue type and reason): tends to not chew her food much--just swallows it                     Toilet Transfer: Moderate assistance;Stand-pivot Toilet Transfer Details (indicate cue type and reason): simulated be to recliner and back to bed (posterior lean upon standing and somewhat with taking steps to chair as well)                Extremity/Trunk Assessment Upper Extremity Assessment Upper Extremity Assessment: Generalized weakness            Vision Patient Visual Report: No change from baseline            Cognition Arousal: Alert Behavior During Therapy: Flat affect, [facility]  for tasks assessed/performed Overall Cognitive Status: History of cognitive impairments - at baseline                                 General Comments: cognitive deficits at baseline, follows commands with incr time                   Pertinent Vitals/ Pain       Pain Assessment Pain Assessment: No/denies pain         Frequency  Min 1X/week        Progress Toward Goals  OT Goals(current goals  can now be found in the care plan section)  Progress towards OT goals: Progressing toward goals  Acute Rehab OT Goals Patient Stated Goal: wanting help to eat (which I did) OT Goal Formulation: With patient Time For Goal Achievement: 03/01/23 Potential to Achieve Goals: Good         AM-PAC OT "6 Clicks" Daily Activity     Outcome Measure   Help from another person eating meals?: A Little Help from another person taking care of personal grooming?: A Little Help from another person toileting, which includes using toliet, bedpan, or urinal?: A Lot Help from another person bathing (including washing, rinsing, drying)?: A Lot Help from another person to put on and taking off regular upper body clothing?: A Lot Help from another person to put on and taking off regular lower body clothing?: Total 6 Click Score: 13    End of Session Equipment Utilized During Treatment: Gait belt  OT Visit Diagnosis: Unsteadiness on feet (R26.81);Other abnormalities of gait and mobility (R26.89);Muscle weakness (generalized) (M62.81);Other symptoms and signs involving cognitive function   Activity Tolerance Patient tolerated treatment well   Patient Left in bed;with call bell/phone within reach;with bed alarm set           Time: 1422-1444 OT Time Calculation (min): 22 min  Charges: OT General Charges $OT Visit: 1 Visit OT Treatments $Self Care/Home Management : 8-22 mins  Lindon Romp OT Acute Rehabilitation Services Office [phone]   Evette Georges 02/18/2023, 3:14 PM

## 2023-02-18 NOTE — NC FL2 (Addendum)
[employer] MEDICAID FL2 LEVEL OF CARE FORM     IDENTIFICATION  Patient Name: Annette Martin Birthdate: Nov 08, 1955 Sex: female Admission Date (Current Location): 02/14/2023  [facility] and [place] Number:  [employer] and Address:  [facility], [address], [place], [place] [address]      Provider Number: [id number]  Attending Physician Name and Address:  Ginnie Smart, MD  Relative Name and Phone Number:       Current Level of Care: Hospital Recommended Level of Care: Skilled Nursing Facility Prior Approval Number:    Date Approved/Denied:   PASRR Number: Manual review  Discharge Plan: SNF    Current Diagnoses: Patient Active Problem List   Diagnosis Date Noted   Hypertension 02/16/2023   Anemia 02/15/2023   Acute left-sided weakness 02/14/2023   Schizophrenia (HCC) 04/05/2018   Intellectual disability 12/05/2012   Diabetes mellitus (HCC) 05/28/2011   Hypothyroidism 05/28/2011   Hyperlipidemia 05/28/2011    Orientation RESPIRATION BLADDER Height & Weight     Self, Situation, Place  Normal Incontinent Weight: 219 lb 2.2 oz (99.4 kg) Height:  5\' 2"  (157.5 cm)  BEHAVIORAL SYMPTOMS/MOOD NEUROLOGICAL BOWEL NUTRITION STATUS      Incontinent Diet (Carb Modified)  AMBULATORY STATUS COMMUNICATION OF NEEDS Skin   Extensive Assist Verbally Normal                       Personal Care Assistance Level of Assistance  Feeding, Bathing, Dressing Bathing Assistance: Maximum assistance Feeding assistance: Limited assistance Dressing Assistance: Maximum assistance     Functional Limitations Info             SPECIAL CARE FACTORS FREQUENCY  PT (By licensed PT), OT (By licensed OT)     PT Frequency: 5x/week OT Frequency: 5x/week            Contractures Contractures Info: Not present    Additional Factors Info  Code Status, Allergies Code Status Info: Full Allergies Info: NKA Psychotropic Info: Klonopin 0.25mg   3x/day; Depakote 250mg  3x/day; Zoloft 200 mg Insulin Sliding Scale Info: see DC summary       Current Medications (02/18/2023):  This is the current hospital active medication list Current Facility-Administered Medications  Medication Dose Route Frequency Provider Last Rate Last Admin   acetaminophen (TYLENOL) tablet 650 mg  650 mg Oral Q6H PRN Gwenevere Abbot, MD   650 mg at 02/18/23 1442   Or   acetaminophen (TYLENOL) suppository 650 mg  650 mg Rectal Q6H PRN Gwenevere Abbot, MD       atorvastatin (LIPITOR) tablet 10 mg  10 mg Oral Daily Gwenevere Abbot, MD   10 mg at 02/18/23 0855   clonazePAM (KLONOPIN) disintegrating tablet 0.25 mg  0.25 mg Oral TID Gwenevere Abbot, MD   0.25 mg at 02/18/23 0855   divalproex (DEPAKOTE) DR tablet 250 mg  250 mg Oral TID Colbert Coyer, Priscila, MD   250 mg at 02/18/23 0855   enoxaparin (LOVENOX) injection 40 mg  40 mg Subcutaneous Q24H Gwenevere Abbot, MD   40 mg at 02/17/23 1634   ferrous sulfate tablet 325 mg  325 mg Oral Q breakfast Colbert Coyer, Priscila, MD   325 mg at 02/18/23 0855   gabapentin (NEURONTIN) capsule 100 mg  100 mg Oral BID Gwenevere Abbot, MD   100 mg at 02/18/23 0854   haloperidol (HALDOL) tablet 5 mg  5 mg Oral BID Philomena Doheny, MD   5 mg  at 02/18/23 0855   influenza vaccine adjuvanted (FLUAD) injection 0.5 mL  0.5 mL Intramuscular Tomorrow-1000 Ginnie Smart, MD       insulin aspart (novoLOG) injection 0-15 Units  0-15 Units Subcutaneous TID WC Colbert Coyer, Priscila, MD   3 Units at 02/18/23 1224   insulin aspart (novoLOG) injection 0-5 Units  0-5 Units Subcutaneous QHS Colbert Coyer, Priscila, MD       lidocaine (LIDODERM) 5 % 1 patch  1 patch Transdermal Q24H Colbert Coyer, Priscila, MD   1 patch at 02/18/23 1131   losartan (COZAAR) tablet 25 mg  25 mg Oral Daily Colbert Coyer, Priscila, MD   25 mg at 02/18/23 0855   pantoprazole (PROTONIX) EC tablet 40 mg  40 mg Oral BID Colbert Coyer, Priscila, MD   40 mg at  02/18/23 0854   polyethylene glycol (MIRALAX / GLYCOLAX) packet 17 g  17 g Oral Daily Colbert Coyer, Priscila, MD   17 g at 02/18/23 1131   senna-docusate (Senokot-S) tablet 1 tablet  1 tablet Oral QHS PRN Gwenevere Abbot, MD       sertraline (ZOLOFT) tablet 200 mg  200 mg Oral Daily Colbert Coyer, Alyson Locket, MD   200 mg at 02/18/23 1610     Discharge Medications: Please see discharge summary for a list of discharge medications.  Relevant Imaging Results:  Relevant Lab Results:   Additional Information SS#: [id number]  Antion Felipa [place], Student-Social Work

## 2023-02-19 DIAGNOSIS — R251 Tremor, unspecified: Secondary | ICD-10-CM | POA: Diagnosis not present

## 2023-02-19 DIAGNOSIS — R531 Weakness: Secondary | ICD-10-CM | POA: Diagnosis not present

## 2023-02-19 LAB — GLUCOSE, CAPILLARY
Glucose-Capillary: 190 mg/dL — ABNORMAL HIGH (ref 70–99)
Glucose-Capillary: 160 mg/dL — ABNORMAL HIGH (ref 70–99)

## 2023-02-19 MED ORDER — ACETAMINOPHEN 325 MG PO TABS: 650.0000 mg | ORAL_TABLET | Freq: Four times a day (QID) | ORAL | Status: DC | PRN

## 2023-02-19 MED ORDER — INSULIN ASPART 100 UNIT/ML IJ SOLN
0.0000 [IU] | INTRAMUSCULAR | Status: DC
  Administered 2023-02-19 (×2): 4 [IU] via SUBCUTANEOUS

## 2023-02-19 MED ORDER — POLYETHYLENE GLYCOL 3350 17 G PO PACK: 17.0000 g | PACK | Freq: Every day | ORAL | Status: DC

## 2023-02-19 MED ORDER — CLONAZEPAM 0.25 MG PO TBDP: 0.2500 mg | ORAL_TABLET | Freq: Three times a day (TID) | ORAL | 0 refills | Status: DC

## 2023-02-19 MED ORDER — CLONAZEPAM 0.25 MG PO TBDP: 0.2500 mg | ORAL_TABLET | Freq: Three times a day (TID) | ORAL | Status: DC

## 2023-02-19 MED ORDER — HALOPERIDOL 5 MG PO TABS: 5.0000 mg | ORAL_TABLET | Freq: Two times a day (BID) | ORAL | 0 refills | Status: DC

## 2023-02-19 MED ORDER — AMANTADINE HCL 100 MG PO CAPS
100.0000 mg | ORAL_CAPSULE | Freq: Two times a day (BID) | ORAL | Status: DC
  Administered 2023-02-19: 100 mg via ORAL
  Filled 2023-02-19: qty 1

## 2023-02-19 MED ORDER — FERROUS SULFATE 325 (65 FE) MG PO TABS: 325.0000 mg | ORAL_TABLET | Freq: Every day | ORAL | Status: DC

## 2023-02-19 MED ORDER — RIVAROXABAN 10 MG PO TABS
10.0000 mg | ORAL_TABLET | Freq: Every day | ORAL | Status: DC
  Administered 2023-02-19: 10 mg via ORAL
  Filled 2023-02-19: qty 1

## 2023-02-19 MED ORDER — HALOPERIDOL 5 MG PO TABS: 5.0000 mg | ORAL_TABLET | Freq: Two times a day (BID) | ORAL | Status: DC

## 2023-02-19 MED ORDER — SENNOSIDES-DOCUSATE SODIUM 8.6-50 MG PO TABS: 1.0000 | ORAL_TABLET | Freq: Two times a day (BID) | ORAL | Status: DC

## 2023-02-19 MED ORDER — SENNOSIDES-DOCUSATE SODIUM 8.6-50 MG PO TABS: 2.0000 | ORAL_TABLET | Freq: Two times a day (BID) | ORAL | 0 refills | Status: DC

## 2023-02-19 MED ORDER — SENNOSIDES-DOCUSATE SODIUM 8.6-50 MG PO TABS
2.0000 | ORAL_TABLET | Freq: Two times a day (BID) | ORAL | Status: DC
  Administered 2023-02-19: 2 via ORAL

## 2023-02-19 MED ORDER — LOSARTAN POTASSIUM 25 MG PO TABS: 25.0000 mg | ORAL_TABLET | Freq: Every day | ORAL | Status: DC

## 2023-02-19 MED ORDER — LIDOCAINE 5 % EX PTCH: 1.0000 | MEDICATED_PATCH | CUTANEOUS | Status: DC

## 2023-02-19 NOTE — Plan of Care (Signed)
  Problem: Education: Goal: Knowledge of General Education information will improve Description: Including pain rating scale, medication(s)/side effects and non-pharmacologic comfort measures Outcome: Progressing   Problem: Health Behavior/Discharge Planning: Goal: Ability to manage health-related needs will improve Outcome: Progressing   Problem: Clinical Measurements: Goal: Ability to maintain clinical measurements within normal limits will improve Outcome: Progressing Goal: Will remain free from infection Outcome: Progressing Goal: Diagnostic test results will improve Outcome: Progressing Goal: Respiratory complications will improve Outcome: Progressing Goal: Cardiovascular complication will be avoided Outcome: Progressing   Problem: Activity: Goal: Risk for activity intolerance will decrease Outcome: Progressing   Problem: Nutrition: Goal: Adequate nutrition will be maintained Outcome: Progressing   Problem: Coping: Goal: Level of anxiety will decrease Outcome: Progressing   Problem: Elimination: Goal: Will not experience complications related to bowel motility Outcome: Progressing Goal: Will not experience complications related to urinary retention Outcome: Progressing   Problem: Pain Managment: Goal: General experience of comfort will improve Outcome: Progressing   Problem: Safety: Goal: Ability to remain free from injury will improve Outcome: Progressing   Problem: Skin Integrity: Goal: Risk for impaired skin integrity will decrease Outcome: Progressing   Problem: Education: Goal: Ability to describe self-care measures that may prevent or decrease complications (Diabetes Survival Skills Education) will improve Outcome: Progressing Goal: Individualized Educational Video(s) Outcome: Progressing   Problem: Coping: Goal: Ability to adjust to condition or change in health will improve Outcome: Progressing   Problem: Fluid Volume: Goal: Ability to  maintain a balanced intake and output will improve Outcome: Progressing   Problem: Metabolic: Goal: Ability to maintain appropriate glucose levels will improve Outcome: Progressing   Problem: Nutritional: Goal: Maintenance of adequate nutrition will improve Outcome: Progressing Goal: Progress toward achieving an optimal weight will improve Outcome: Progressing   Problem: Skin Integrity: Goal: Risk for impaired skin integrity will decrease Outcome: Progressing   Problem: Tissue Perfusion: Goal: Adequacy of tissue perfusion will improve Outcome: Progressing

## 2023-02-19 NOTE — Progress Notes (Signed)
Summary: Patient is a 67 yo patient with a history of depression, T2DM, schizophrenia, hypothyroidism, HLD, GERD, urinary incontinence, and mild MR who presents from her assisted living facility with left sided weakness and AMS. She was admitted to the Internal Medicine Service on 9/29 for further workup.   Subjective:  No overnight events reported. Patient sitting up in bed, eating breakfast. Was observed dropping food from spoon onto herself, patient requested some help with cleanup and with feeds. Denied any NV or abdominal pain. Endorses lower back pain, which seems to be chronic. Has not had BM. Will maximize current BM regimen. Patient denied any other acute pain.   Objective:  Vital signs in last 24 hours: Vitals:   02/18/23 1939 02/19/23 0004 02/19/23 0625 02/19/23 0902  BP: (!) 145/71 (!) 159/92 107/81 (!) 143/72  Pulse: 81 80 84 79  Resp: 18 16 16 17   Temp: 98.6 F (37 C) 98.4 F (36.9 C) 98.6 F (37 C) 98.4 F (36.9 C)  TempSrc: Oral Oral Oral Oral  SpO2: 96% 99% 98% 99%  Weight:      Height:       CBG 190 Lab holiday   Physical Exam Constitutional: Patient cooperative, verbalizing comments and answering questions appropriately.  CV: RRR, no r/m/g Pulmonary: Normal respiratory effort on RA Neuro: Alert and oriented, moving all limbs spontaneously, facial symmetry, right sided tremor present, no new focal deficits observed;  Abdominal: Soft, non-tender, non-distended; positive bowel sounds Psych: Normal mood, flat affect  Assessment/Plan:  Principal Problem:   Acute left-sided weakness Active Problems:   Hyperlipidemia   Anemia   Hypertension   Tremor  Patient is a 67 yo patient with a history of depression, T2DM, schizophrenia, hypothyroidism, HLD, GERD, urinary incontinence, and mild MR who presented from her assisted living facility with left sided weakness and AMS.    Patient continues to be stable on exam. Patient to work with PT and OT at SNF prior  to returning to ALF. Patient's POA (brother Hessie Diener) in agreement. Social work assisting, appreciate their help.   Plan:  - Monitor for continued improvement - SW working on SNF placement - Patient with outpatient appointment with [facility] scheduled for 11/18   #Elevated blood pressures Patient with blood pressures 160s-170s/60s-100s on admission. Not on an antihypertensive at ALF. Renal function stable Cr 0.94, GFR > 60. Started Losartan 12.5 mg daily 10/2 with minimal improvement, increased to 25 mg daily on 10/03. BP improved to 107/81 10/4, some higher blood pressures noted at night, possible OSA component, recommend outpatient follow up.    #Lower back pain Most likely musculoskeletal, improved with lidocaine patch. Will continue.   #Anemia Patient with Hgb 10.2 on admission, 10.1 10/4, stable. No overt bleeding observed. Continue PO iron replacement.  - Monitor daily CBC   #Urinary incontinence History of urinary incontinence per chart review, however on home fesoterodine and bethanecol. Held on admission. UA unremarkable, continues to deny dysuria. Using external catheter as needed.  - Monitor    #Raynaud's Observed in both hands on 9/30. Per patient, chronic. Will include in discharge for outpatient follow up.   Chronic conditions:   #T2DM Chronic. A1C on admission 7.6. Takes metformin 500 mg BID at assisted living facility. Patient placed on moderate SSI on admission> CBGs 190, 148, 163 as patient begins to eat more. Changed to resistant SSI 10/4. Resumed gabapentin 100 mg BID 9/30 for neuropathic pain. Will make recommendations for outpatient follow up.    #Mood disorders  History of schizophrenia, depression, and anxiety. Medications include sertraline, haloperidol, clonazepam, and depakote. Held off on clonazepam and haloperidol on admission, will plan to wean off and decrease dose with recommendations for discharge. Started depakote 250 mg TID 9/29,  clonazepam 0.25 mg TID (decreased dose) 9/30, sertraline 200 mg 9/30, and haloperidol 5 mg BID (reduced dose) 10/1. Restarted amantadine 10/4 as patient was observed with worsening right sided tremor that may be impact her ability to feed herself.     #GERD Chronic, resumed pantoprazole 40 mg BID.   Diet: CM  DVT prophylaxis: Xarelto 10 mg  Code status: Full    Prior to Admission Living Arrangement: Anticipated Discharge Location: Barriers to Discharge: Dispo: Anticipated discharge in approximately 1 day(s).   Philomena Doheny, MD, PGY-1 02/19/2023, 10:07 AM Pager: [phone] After 5pm on weekdays and 1pm on weekends: On Call pager [phone]  *From note entered as H&P earlier today

## 2023-02-19 NOTE — TOC Transition Note (Signed)
Transition of Care [facility]) - CM/SW Discharge Note   Patient Details  Name: ALYZE LAUF MRN: [id number] Date of Birth: April 04, 1956  Transition of [facility]) CM/SW Contact:  Baldemar Lenis, LCSW Phone Number: 02/19/2023, 3:21 PM   Clinical Narrative:   CSW coordinated with PT assigned today to discuss patient's care needs, SNF is still recommended. CSW updated Hessie Diener and provided bed offers. Hessie Diener chose [facility]. CSW confirmed with [facility] that bed is available. CSW asked CMA to initiate insurance authorization request, and insurance was approved. [facility] can admit today. CSW updated MD, sent discharge information. Hessie Diener in agreement to transfer. Transport arranged with [facility] for next available.  Nurse to call report to [phone] Room 104.    Final next level of care: Skilled Nursing Facility Barriers to Discharge: Barriers Resolved   Patient Goals and CMS Choice CMS Medicare.gov Compare Post Acute Care list provided to:: Legal Guardian Choice offered to / list presented to : [facility] POA / Guardian  Discharge Placement                Patient [facility] bed at: [facility] Patient to be transferred to facility by: [facility] Name of family member notified: Hessie Diener Patient and family notified of of transfer: 02/19/23  Discharge Plan and Services Additional resources added to the After Visit Summary for       Post Acute Care Choice: Nursing Home                               Social Determinants of Health (SDOH) Interventions SDOH Screenings   Food Insecurity: Food Insecurity Present (02/15/2023)  Housing: Low Risk  (02/15/2023)  Transportation Needs: No Transportation Needs (02/15/2023)  Utilities: Not At Risk (02/15/2023)  Alcohol Screen: Low Risk  (04/05/2018)  Tobacco Use: Low Risk  (02/14/2023)     Readmission Risk Interventions     No data to display

## 2023-02-19 NOTE — Progress Notes (Signed)
Physical Therapy Treatment Patient Details Name: Annette Martin MRN: [id number] DOB: Sep 13, 1955 Today's Date: 02/19/2023   History of Present Illness 66yo female who presented to the ED from her ALF with L sided weakness and AMS, also had fall out of bed the day before admission. Imaging negative for acute CVA. Admitted for further w/u. PMH DM, HTN, hypoxemia, mild MR, neuroleptic malignant syndrome, rhabdomyolysis, schizophrenia, thyroid disease    PT Comments  Pt requiring more assist for mobility than at baseline. Patient will benefit from continued inpatient follow up therapy, <3 hours/day     If plan is discharge home, recommend the following: A lot of help with bathing/dressing/bathroom;Supervision due to cognitive status;A little help with walking and/or transfers;Direct supervision/assist for medications management;Direct supervision/assist for financial management;Assist for transportation   Can travel by private vehicle     No  Equipment Recommendations  None recommended by PT    Recommendations for Other Services       Precautions / Restrictions Precautions Precautions: Fall;Other (comment) Precaution Comments: L weakness Restrictions Weight Bearing Restrictions: No     Mobility  Bed Mobility               General bed mobility comments: Pt up in chair    Transfers Overall transfer level: Needs assistance Equipment used: 1 person hand held assist Transfers: Sit to/from Stand, Bed to chair/wheelchair/BSC Sit to Stand: Min assist Stand pivot transfers: Mod assist         General transfer comment: Mod assist to stand from chair to bring hips up and correct posterior lean. Chair to [facility] to chair with mod assist for stand pivot for balance and support    Ambulation/Gait               General Gait Details: Pt non ambulatory prior to admission   Stairs             Wheelchair Mobility     Tilt Bed    Modified Rankin (Stroke Patients  Only)       Balance Overall balance assessment: History of Falls, Needs assistance Sitting-balance support: Feet supported, No upper extremity supported Sitting balance-Leahy Scale: Fair     Standing balance support: During functional activity, Single extremity supported Standing balance-Leahy Scale: Poor Standing balance comment: Min assist for static standing                            Cognition Arousal: Alert Behavior During Therapy: Flat affect, WFL for tasks assessed/performed Overall Cognitive Status: History of cognitive impairments - at baseline                                 General Comments: cognitive deficits at baseline, follows commands with incr time        Exercises      General Comments        Pertinent Vitals/Pain Pain Assessment Pain Assessment: Faces Faces Pain Scale: Hurts a little bit Pain Location: back Pain Descriptors / Indicators: Discomfort Pain Intervention(s): Limited activity within patient's tolerance, Repositioned    Home Living                          Prior Function            PT Goals (current goals can now be found in the care plan section) Acute Rehab PT  Goals Patient Stated Goal: return home Progress towards PT goals: Not progressing toward goals - comment    Frequency    Min 1X/week      PT Plan      Co-evaluation              AM-PAC PT "6 Clicks" Mobility   Outcome Measure  Help needed turning from your back to your side while in a flat bed without using bedrails?: A Little Help needed moving from lying on your back to sitting on the side of a flat bed without using bedrails?: A Little Help needed moving to and from a bed to a chair (including a wheelchair)?: A Lot Help needed standing up from a chair using your arms (e.g., wheelchair or bedside chair)?: A Lot Help needed to walk in hospital room?: Total Help needed climbing 3-5 steps with a railing? : Total 6  Click Score: 12    End of Session Equipment Utilized During Treatment: Gait belt Activity Tolerance: Patient tolerated treatment well Patient left: with call bell/phone within reach;in chair;with chair alarm set   PT Visit Diagnosis: Unsteadiness on feet (R26.81);Muscle weakness (generalized) (M62.81);Other abnormalities of gait and mobility (R26.89)     Time: 6213-0865 PT Time Calculation (min) (ACUTE ONLY): 17 min  Charges:    $Therapeutic Activity: 8-22 mins PT General Charges $$ ACUTE PT VISIT: 1 Visit                     [facility] PT Acute Rehabilitation Services Office [phone]    Angelina Ok [facility] 02/19/2023, 11:49 AM

## 2023-02-19 NOTE — Discharge Instructions (Signed)
Discharge Instructions: Patient Instructions:  You were seen for acute left-sided weakness. You were worked up during this hospitalization and we think you may have experienced a Transient Ischemic Attack, which can cause stroke-like symptoms that resolve within minutes to hours. Neurology has set up a follow up appointment for you on 04/05/2023 with Dr. Joycelyn Schmid at 11:00 AM.   We think some of your other weakness may be due to physical deconditioning. We recommend that you continue working on your strength when you leave the hospital.   We have made some changes to your medications. We recommend that you discuss these changes with the physician who follows your care at [facility]. We also recommend some lab follow up to help guide your care, including a basic metabolic panel to monitor your kidney function (you were started on a medication that can affect kidney function, called Losartan for your blood pressure) and a complete blood count to help monitor your hemoglobin (due to your chronic anemia).   Your PCP will be able to help manage your chronic medical problems. Please make sure you are seen by your PCP in the next 10-14 days for hospitalization follow-up.   We are glad you are feeling better. It was a pleasure taking care of you. - Dr. Justin Mend and the Internal Medicine Teaching Service team

## 2023-02-19 NOTE — Discharge Summary (Signed)
Name: Annette Martin MRN: [id number] DOB: 1956-04-14 67 y.o. PCP: Galvin Proffer, MD  Date of Admission: 02/14/2023  9:41 AM Date of Discharge: 02/19/2023 2:39 PM Attending Physician: Dr.  Ninetta Lights  Discharge Diagnosis: Principal Problem:   Acute left-sided weakness Active Problems:   Hyperlipidemia   Anemia   Hypertension   Tremor    Discharge Medications: Allergies as of 02/19/2023   No Known Allergies      Medication List     STOP taking these medications    bethanechol 25 MG tablet Commonly known as: URECHOLINE   clonazePAM 0.5 MG tablet Commonly known as: KLONOPIN Replaced by: clonazePAM 0.25 MG disintegrating tablet   docusate sodium 100 MG capsule Commonly known as: COLACE   lactulose 10 GM/15ML solution Commonly known as: CHRONULAC   magnesium oxide 400 (240 Mg) MG tablet Commonly known as: MAG-OX   meclizine 25 MG tablet Commonly known as: ANTIVERT   Nuedexta 20-10 MG capsule Generic drug: Dextromethorphan-quiNIDine   Toviaz 4 MG Tb24 tablet Generic drug: fesoterodine       TAKE these medications    acetaminophen 325 MG tablet Commonly known as: TYLENOL Take 2 tablets (650 mg total) by mouth every 6 (six) hours as needed for mild pain (or Fever >/= 101).   amantadine 100 MG capsule Commonly known as: SYMMETREL Take 100 mg by mouth 2 (two) times daily.   atorvastatin 10 MG tablet Commonly known as: LIPITOR Take 10 mg by mouth at bedtime.   clonazePAM 0.25 MG disintegrating tablet Commonly known as: KLONOPIN Take 1 tablet (0.25 mg total) by mouth 3 (three) times daily. Replaces: clonazePAM 0.5 MG tablet   divalproex 250 MG DR tablet Commonly known as: DEPAKOTE Take 250 mg by mouth 3 (three) times daily.   ferrous sulfate 325 (65 FE) MG tablet Take 1 tablet (325 mg total) by mouth daily with breakfast. Start taking on: February 20, 2023   gabapentin 300 MG capsule Commonly known as: NEURONTIN Take 300 mg by mouth 3 (three)  times daily.   haloperidol 5 MG tablet Commonly known as: HALDOL Take 1 tablet (5 mg total) by mouth 2 (two) times daily. What changed:  how much to take when to take this Another medication with the same name was removed. Continue taking this medication, and follow the directions you see here.   lidocaine 5 % Commonly known as: LIDODERM Place 1 patch onto the skin daily. Remove & Discard patch within 12 hours or as directed by MD Start taking on: February 20, 2023   losartan 25 MG tablet Commonly known as: COZAAR Take 1 tablet (25 mg total) by mouth daily. Start taking on: February 20, 2023   meloxicam 7.5 MG tablet Commonly known as: MOBIC Take 7.5 mg by mouth daily.   metFORMIN 1000 MG tablet Commonly known as: GLUCOPHAGE Take 500 mg by mouth 2 (two) times daily with a meal.   ondansetron 4 MG tablet Commonly known as: ZOFRAN Take 4 mg by mouth every 6 (six) hours as needed for nausea or vomiting.   pantoprazole 40 MG tablet Commonly known as: PROTONIX Take 40 mg by mouth 2 (two) times daily.   polyethylene glycol 17 g packet Commonly known as: MIRALAX / GLYCOLAX Take 17 g by mouth daily. Start taking on: February 20, 2023   senna-docusate 8.6-50 MG tablet Commonly known as: Senokot-S Take 2 tablets by mouth 2 (two) times daily.   sertraline 100 MG tablet Commonly known as: ZOLOFT Take 200  mg by mouth daily.        Disposition and follow-up:   Annette Martin was discharged from [facility] in Stable condition.  At the hospital follow up visit please address:  Follow-up:  T2DM  - Hgb A1C on admission 7.6. CBGs trended up as patient began eating. Resumed home metformin 500 mg BID, may need to go up on dose for better control.  2. Raynaud's Syndrome - Observed what looked to be Raynaud's 1x on physical exam on R and L fingertips during hospitalization. Patient complained of cold hands, no other symptoms. Follow up to determine if symptoms  bothersome or if there is a need to treat.  3. Psychiatric/centrally-acting medications - Observed right arm/hand tremor. Held some centrally acting medications on admission as patient was worked up for confusion and weakness including haldol, amantadine, dextromethorpan-quinidine, meclizine, and clonazepam. EKG on admission showing QT prolongation. Home haldol dose decreased to 5 mg BID and clonazepam decreased to 0.25 mg TID while patient worked up for confusion and weakness. Restarted amantadine 10/4 as patient's tremor impacting ability to feed herself. Consider weaning off some of these medications if appropriate. If appropriate, consider second generation antipsychotics. 4. HTN  - Blood pressures fluctuating between 150s-170s systolic over 70s-100s diastolic. Patient denied CP. Renal function stable. Discharged patient on Losartan 25 mg, please follow up BP on discharge and renal function to determine if appropriate to continue.  5. Urinary incontinence meds - Patient on fesoterodine and bethanecol. Have not resumed on admission as one treats overactive bladder and one promotes bladder emptying. Recommend reviewing medications to determine which one would be most helpful to restart on discharge.  6. Physical deconditioning - Patient evaluated by PT/OT. Continue to work with staff or therapists to help regain strength.   2.  Labs / imaging needed at time of follow-up: CBC, BMP   3.  Pending labs/ test needing follow-up: None  4.  Medication Changes  STOPPED  - bethanechol  - docusate  - lactulose  - magnesium oxide   - meclizine  - Nuedexta/dextromethorphan-quinidine  - Toviaz/fesoterodine   ADDED  - losartan  - lidocaine patch   - ferrous sulfate  - polyethylene glycol  - senna-docusate   MODIFIED  - clonazepam  - haloperidol    Follow-up Appointments: 04/05/2023 @ 11:00 AM with Dr. Joycelyn Schmid (Neurology)  Hospital Course by problem list: Hospital Course:   Patient is a 67 yo patient with a history of depression, T2DM, schizophrenia, hypothyroidism, HLD, GERD, urinary incontinence, and mild MR who presented from her assisted living facility with left sided weakness and AMS. She was admitted to the Internal Medicine Service on 9/29 for further workup.   Acute left-sided weakness Patient presented to the ED with symptoms concerning for stroke and neurology was consulted.  Vitals include BP 162/74, HR 90, afebrile, on room air. CBG 264. Potassium noted to be 7.4 on admission, likely spurious lab. Hgb, 10.2, Cr. 0.80. FOBT and ethanol screen negative. EKG NSR, HR 89, prolonged Qtc 485. CT head, CT angio, MR brain not concerning for acute ischemic or hemorrhaging stroke, CXR with mild bibasilar interstitial opacities. COVID PCR negative. Repeat K 4.0, 4.2. Given Na 1000 mL bolus.   On initial exam patient with observable left facial droop, drool pooling on side of mouth, speaking at a slow rate with dysarthric speech. Demonstrated decreased sensation on left along V2, V3 distribution. Patient was able to follow simple prompts with verbal cues and repetitions. Patient able  to spontaneously lift and move R and L lower extremities and R upper extremity. Unable to lift left arm against gravity without assistance, observed wiggling R hand fingers.   Differential diagnosis on admission included stroke vs infection vs NPH vs TIA vs trauma vs medication related encephalopathy vs seizures. No evidence of CVA on imaging or new seizure on EEG. Low suspicion for infectious etiology, or trauma.   Left facial droop, slurred and slowed speech, and left sided weakness improved day after admission. Patient appeared to be back at her baseline. Etiology of patient's left sided facial droop, left sided weakness, and confusion most likely due to TIA as patient appears to be back at her baseline. Of note, multiple neuropsych medications were held on admission that may have been  contributing to her symptoms. Patient with outpatient appointment with [facility] scheduled for 11/18 can help determine if further work up is needed for other etiologies, including NPH or medication related encephalopathy, if symptoms recur or there is an acute change in function.   Patient was evaluated by OT and PT, who helped determine that patient would benefit from skilled interventions at either ALF or SNF. Social work followed to help with discharge of patient back to ALF.    Elevated blood pressure Patient with blood pressures 160s-170s/60s-100s. Not on an antihypertensive at ALF. Renal function stable Cr 0.93, GFR > 60. Started Losartan 12.5 mg daily 10/2. BP minimally improved on low dose, increased to 25 mg daily on 10/3. Serum Cr. 0.94, GFR > 60 on discharge.     Lower back pain Most likely musculoskeletal, ordered lidocaine patch. Patient endorsed improvement in pain with patch.   Anemia Patient with Hgb 10.2 on admission, 10.1 on 10/3. No overt bleeding on discharge physical exam. Iron studies showing iron deficiency, started on oral iron replacement 9/30. Recommend continuing on discharge.    Urinary incontinence History of urinary incontinence, however on home fesoterodine and bethanecol. Have not resumed on admission as one treats overactive bladder and one promotes bladder emptying. UA unremarkable, denied dysuria. External urinary catheter worn throughout hospitalization without issue. Will recommend medication review of urinary incontinence by ALF at discharge.  Raynaud's Observed in both hands on 9/30. Per patient, feels like hands are cold frequently, seems to be chronic. Will include in discharge for outpatient follow up.  T2DM Chronic. Hgb A1C on admission 7.6. Takes metformin 500 mg BID at assisted living facility. Monitored daily CBGs, which trended up over time. Changed from moderate SSI to resistant SSI on 10/4. Resumed gabapentin 100 mg BID 9/30 for  neuropathic pain. Patient may need to go up on metformin for better control, follow up outpatient.   Mood disorders History of schizophrenia, depression, and anxiety. Medications include sertraline, haloperidol, clonazepam, and depakote. Held off on clonazepam and haloperidol on admission, will plan to wean off and decrease dose with recommendations for discharge. Started depakote 250 mg TID 9/29, clonazepam 0.25 mg TID (decreased dose) 9/30, sertraline 200 mg 9/30, and haloperidol 5 mg BID (reduced dose) 10/1. Patient was observed to have right upper extremity tremor at rest, per patient this has been present for some time. Resumed amantadine 100 mg BID on 10/4 as tremor was impacting ability to feed herself.   GERD Chronic, resumed pantoprazole 40 mg BID on admission.   Discharge Subjective: No overnight events reported. Patient sitting up in bed, eating breakfast. Was observed dropping food from spoon onto herself, patient requested some help with cleanup and with feeds. Denied any  NV or abdominal pain. Endorses lower back pain, which seems to be chronic. Has not had BM. Will maximize current BM regimen. Patient denied any other acute pain.   Discharge Exam:   Blood pressure (!) 121/94, pulse 97, temperature 98.5 F (36.9 C), temperature source Oral, resp. rate 17, height 5\' 2"  (1.575 m), weight 99.4 kg, SpO2 97%.  Constitutional: Patient cooperative, verbalizing comments and answering questions appropriately.  CV: RRR, n/m/g Pulmonary: Normal respiratory effort on RA Neuro: Alert and oriented, moving all limbs spontaneously, facial symmetry, right sided tremor present, no new focal deficits on exam  Abdominal: Soft, non-tender, non-distended; positive bowel sounds Psych: Normal mood, flat affect  Pertinent Labs, Studies, and Procedures:     Latest Ref Rng & Units 02/18/2023    8:06 AM 02/17/2023    5:01 AM 02/16/2023    5:12 AM  CBC  WBC 4.0 - 10.5 K/uL 4.3  5.2  5.2   Hemoglobin 12.0 -  15.0 g/dL 72.5  9.8  9.0   Hematocrit 36.0 - 46.0 % 32.9  32.3  29.6   Platelets 150 - 400 K/uL 253  252  246        Latest Ref Rng & Units 02/18/2023    8:06 AM 02/16/2023    5:12 AM 02/15/2023    3:56 AM  CMP  Glucose 70 - 99 mg/dL 366  440  347   BUN 8 - 23 mg/dL 15  16  14    Creatinine 0.44 - 1.00 mg/dL 4.25  9.56  3.87   Sodium 135 - 145 mmol/L 136  136  141   Potassium 3.5 - 5.1 mmol/L 3.9  4.0  4.0   Chloride 98 - 111 mmol/L 97  98  99   CO2 22 - 32 mmol/L 26  26  28    Calcium 8.9 - 10.3 mg/dL 8.8  8.8  9.0     MR BRAIN WO CONTRAST  Result Date: 02/14/2023 CLINICAL DATA:  67 year old female code stroke presentation this morning with facial droop, left side weakness, lethargy. Last seen normal 1800 hours. EXAM: MRI HEAD WITHOUT CONTRAST TECHNIQUE: Multiplanar, multiecho pulse sequences of the brain and surrounding structures were obtained without intravenous contrast. COMPARISON:  CT head, CTA, CTP today.  Brain MRI 10/01/2022. FINDINGS: Brain: No restricted diffusion or evidence of acute infarction. Chronic nonspecific ventriculomegaly is stable since May. No transependymal edema. There is some evidence of hyperdynamic flow at the cerebral aqueduct. No midline shift, mass effect, evidence of mass lesion, extra-axial collection or acute intracranial hemorrhage. Cervicomedullary junction and pituitary are within normal limits. Stable gray and white matter signal throughout the brain. Scattered mild for age subcortical white matter T2 and FLAIR hyperintensity in a nonspecific configuration. No cortical encephalomalacia or chronic cerebral blood products identified. Vascular: Major intracranial vascular flow voids are stable. Skull and upper cervical spine: Negative. Visualized bone marrow signal is within normal limits. Sinuses/Orbits: Stable, negative. Other: Visible internal auditory structures appear normal. Negative visible scalp and face. IMPRESSION: 1. No acute intracranial abnormality.  2. Chronic nonspecific ventriculomegaly. Consider NPH in the appropriate clinical setting. 3. Mild for age nonspecific white matter signal changes. Electronically Signed   By: Odessa Fleming M.D.   On: 02/14/2023 12:07   DG Chest Portable 1 View  Result Date: 02/14/2023 CLINICAL DATA:  Altered mental status EXAM: PORTABLE CHEST - 1 VIEW COMPARISON:  12/21/2022 FINDINGS: Mild coarse interstitial opacities in the lung bases. Heart size and mediastinal contours are within normal limits. No  effusion. Visualized bones unremarkable. IMPRESSION: Mild bibasilar interstitial opacities. Electronically Signed   By: Corlis Leak M.D.   On: 02/14/2023 10:59   CT ANGIO HEAD NECK W WO CM W PERF (CODE STROKE)  Result Date: 02/14/2023 CLINICAL DATA:  67 year old female code stroke presentation. Altered mental status and slurred speech. EXAM: CT ANGIOGRAPHY HEAD AND NECK CT PERFUSION BRAIN TECHNIQUE: Multidetector CT imaging of the head and neck was performed using the standard protocol during bolus administration of intravenous contrast. Multiplanar CT image reconstructions and MIPs were obtained to evaluate the vascular anatomy. Carotid stenosis measurements (when applicable) are obtained utilizing NASCET criteria, using the distal internal carotid diameter as the denominator. Multiphase CT imaging of the brain was performed following IV bolus contrast injection. Subsequent parametric perfusion maps were calculated using RAPID software. RADIATION DOSE REDUCTION: This exam was performed according to the departmental dose-optimization program which includes automated exposure control, adjustment of the mA and/or kV according to patient size and/or use of iterative reconstruction technique. CONTRAST:  OMNIPAQUE IOHEXOL 350 MG/ML SOLN COMPARISON:  Head CT 0954 hours today.  Brain MRI 10/01/2022. FINDINGS: CT Brain Perfusion Findings: ASPECTS: 10 CBF (<30%) Volume: 0mL.  No parameter abnormality. Perfusion (Tmax>6.0s) volume: 0mL.   No parameter abnormality. Mismatch Volume: Not applicable Infarction Location:Not applicable CTA NECK Skeleton: Right TMJ degeneration. Mild for age cervical spine degeneration. No acute osseous abnormality identified. Upper chest: Mild respiratory motion, otherwise negative. Other neck: No acute finding. Aortic arch: Mildly bovine arch configuration. No arch atherosclerosis. Right carotid system: Negative brachiocephalic artery. Tortuous proximal right CCA. Negative right carotid bifurcation. Tortuous cervical right ICA but no plaque or stenosis. Left carotid system: Mildly tortuous left CCA. Negative left carotid bifurcation. No plaque or stenosis. Vertebral arteries: Proximal right subclavian artery is normal. The right vertebral artery has a shared origin with the thyrocervical trunk (series 9, image 273. Right vertebral artery is patent and within normal limits to the skull base. Normal proximal left subclavian artery. Normal left vertebral artery origin. Dominant left vertebral artery. Tortuous left V1 segment. Left vertebral artery is patent and normal to the skull base. CTA HEAD Posterior circulation: Patent distal vertebral arteries and vertebrobasilar junction with no plaque or stenosis. Dominant left V4 segment. Normal PICA origins. Patent basilar artery without stenosis. Normal SCA and PCA origins. Small posterior communicating arteries. Bilateral PCA branches are within normal limits, up to mild P2 segment irregularity. Anterior circulation: Both ICA siphons are patent. Minimal if any ICA siphon plaque and no stenosis. Normal posterior communicating artery origins. Patent carotid termini. Normal MCA and ACA origins. Mild A1 tortuosity. Normal anterior communicating artery and bilateral ACA branches. Left MCA M1 segment bifurcates early without stenosis. Mild crossing venous vessel artifact at the left MCA bifurcation. Right MCA M1 segment and bifurcation are patent without stenosis. Bilateral MCA  branches are within normal limits. Venous sinuses: Patent. Anatomic variants: Dominant left vertebral artery. Review of the MIP images confirms the above findings IMPRESSION: Negative CT Perfusion. And CTA is negative for LVO, atherosclerosis, or stenosis. Mild arterial tortuosity in the head and neck. These results were communicated to Dr. Lovett Sox at 10:28 am on 02/14/2023 by text page via the [facility] messaging system. Electronically Signed   By: Odessa Fleming M.D.   On: 02/14/2023 10:29   EEG adult  Result Date: 02/14/2023 Charlsie Quest, MD     02/14/2023  4:27 PM Patient Name: Annette Martin MRN: [id number] Epilepsy Attending: Charlsie Quest Referring Physician/Provider: Elmer Picker,  NP Date: 02/14/2023 Duration: 23.52 mins Patient history: 67yo F with facial droop, left sided weakness and lethargy. EEG to evaluate for seizure Level of alertness: Awake AEDs during EEG study: None Technical aspects: This EEG study was done with scalp electrodes positioned according to the 10-20 International system of electrode placement. Electrical activity was reviewed with band pass filter of 1-70Hz , sensitivity of 7 uV/mm, display speed of 6mm/sec with a 60Hz  notched filter applied as appropriate. EEG data were recorded continuously and digitally stored.  Video monitoring was available and reviewed as appropriate. Description: The posterior dominant rhythm consists of 8 Hz activity of moderate voltage (25-35 uV) seen predominantly in posterior head regions, symmetric and reactive to eye opening and eye closing. Physiologic photic driving was seen during photic stimulation. Hyperventilation and photic stimulation were not performed.   IMPRESSION: This study is within normal limits. No seizures or epileptiform discharges were seen throughout the recording. A normal interictal EEG does not exclude  the diagnosis of epilepsy. Charlsie Quest   CT HEAD CODE STROKE WO CONTRAST  Result Date: 02/14/2023 CLINICAL DATA:  Code  stroke. 67 year old female neurologic deficit (not otherwise specified at the time of this report). EXAM: CT HEAD WITHOUT CONTRAST TECHNIQUE: Contiguous axial images were obtained from the base of the skull through the vertex without intravenous contrast. RADIATION DOSE REDUCTION: This exam was performed according to the departmental dose-optimization program which includes automated exposure control, adjustment of the mA and/or kV according to patient size and/or use of iterative reconstruction technique. COMPARISON:  Brain MRI 10/01/2022 head CT 12/23/2022. FINDINGS: Brain: Stable cerebral volume. Stable nonspecific ventriculomegaly. No evidence of transependymal edema. No midline shift, mass effect, evidence of mass lesion, intracranial hemorrhage or evidence of cortically based acute infarction. Vascular: No suspicious intracranial vascular hyperdensity. Skull: Osteopenia.  No acute osseous abnormality identified. Sinuses/Orbits: Visualized paranasal sinuses and mastoids are stable and well aerated. Other: No gaze deviation, acute orbit or scalp soft tissue finding. ASPECTS [facility] Stroke Program Early CT Score) Total score (0-10 with 10 being normal): 10 IMPRESSION: 1. No acute cortically based infarct or acute intracranial hemorrhage identified. ASPECTS 10. 2. Stable chronic nonspecific ventriculomegaly. 3. These results were communicated to Dr. Viviann Spare at 9:58 am on 02/14/2023 by text page via the [facility] messaging system. Electronically Signed   By: Odessa Fleming M.D.   On: 02/14/2023 09:59     Discharge Instructions: Discharge Instructions     Call MD for:  difficulty breathing, headache or visual disturbances   Complete by: As directed    Call MD for:  extreme fatigue   Complete by: As directed    Call MD for:  hives   Complete by: As directed    Call MD for:  persistant dizziness or light-headedness   Complete by: As directed    Call MD for:  persistant nausea and vomiting   Complete by: As  directed    Call MD for:  redness, tenderness, or signs of infection (pain, swelling, redness, odor or green/yellow discharge around incision site)   Complete by: As directed    Call MD for:  severe uncontrolled pain   Complete by: As directed    Call MD for:  temperature >100.4   Complete by: As directed    Diet Carb Modified   Complete by: As directed    Discharge instructions   Complete by: As directed    Patient Instructions:  You were seen for acute left-sided weakness. You were worked up during this  hospitalization and we think you may have experienced a Transient Ischemic Attack, which can cause stroke-like symptoms that resolve within minutes to hours. Neurology has set up a follow up appointment for you on 04/05/2023 with Dr. Joycelyn Schmid at 11:00 AM.   We think some of your other weakness may be due to physical deconditioning. We recommend that you continue working on your strength when you leave the hospital.   We have made some changes to your medications. We recommend that you discuss these changes with the physician who follows your care at [facility]. We also recommend some lab follow up to help guide your care, including a basic metabolic panel to monitor your kidney function (you were started on a medication that can affect kidney function, called Losartan for your blood pressure) and a complete blood count to help monitor your hemoglobin (due to your chronic anemia).   Your PCP will be able to help manage your chronic medical problems. Please make sure you are seen by your PCP in the next 10-14 days for hospitalization follow-up.   We are glad you are feeling better. It was a pleasure taking care of you. - Dr. Justin Mend and the Internal Medicine Teaching Service team   Increase activity slowly   Complete by: As directed       Signed: Khayree Delellis Colbert Coyer, MD [facility] - PGY1 Pager: [phone] 02/19/2023, 2:39 PM   Please contact the  on call pager after 5 pm and on weekends at [phone].

## 2023-04-05 ENCOUNTER — Ambulatory Visit (INDEPENDENT_AMBULATORY_CARE_PROVIDER_SITE_OTHER): Payer: Medicare PPO | Admitting: Diagnostic Neuroimaging

## 2023-04-05 ENCOUNTER — Encounter: Payer: Self-pay | Admitting: Diagnostic Neuroimaging

## 2023-04-05 VITALS — BP 138/86 | HR 84 | Ht 65.0 in | Wt 212.0 lb

## 2023-04-05 DIAGNOSIS — G20C Parkinsonism, unspecified: Secondary | ICD-10-CM

## 2023-04-05 NOTE — Progress Notes (Signed)
[facility]  PATIENT: Annette Martin DOB: 17-Nov-1955  REFERRING CLINICIAN: Bedelia Person, MD HISTORY FROM: patient  REASON FOR VISIT: new consult   HISTORICAL  CHIEF COMPLAINT:  Chief Complaint  Patient presents with   New Patient (Initial Visit)    Rm 6, pt coordinatior at [facility]  Pt is referred by Dr Annette Martin for gait/balance difficulty and resting tremor.     HISTORY OF PRESENT ILLNESS:   67 year old female with intellectual disability, schizophrenia, depression, here for evaluation of gait difficulty, resting tremor, antipsychotic medication use and possible side effect.  Patient living at [facility] facility for at least 10 years.  For the past 4 years she has been using a walker.  In the last 1 year she has been using a wheelchair.  Has been on Haldol for many years.  Has been noted to have some resting tremor and parkinsonian symptoms in the last year.  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  Patient was referred to neurosurgery clinic for evaluation.  However this was felt to be a chronic finding and not significantly symptomatic.  Parkinsonian symptoms again were noted and therefore patient referred here for evaluation.  Patient went to emergency room on 02/14/2023 due to onset of left-sided weakness.  Code stroke was activated.  She was been to the hospital.  Stroke was ruled out.   Some of her centrally acting psychiatry medications were held due to ongoing confusion and weakness.  Due to worsening tremors some of these were restarted.    REVIEW OF SYSTEMS: Full 14 system review of systems performed and negative with exception of: as per HPI.  ALLERGIES: No Known Allergies  HOME MEDICATIONS: Outpatient Medications Prior to Visit  Medication Sig Dispense Refill   amantadine (SYMMETREL) 100 MG capsule Take 100 mg by mouth 2 (two) times daily.      atorvastatin (LIPITOR) 10 MG tablet Take 10 mg by mouth at bedtime.     bethanechol (URECHOLINE) 25 MG tablet Take 25 mg by mouth 3 (three) times daily.     clonazePAM (KLONOPIN) 0.5 MG tablet Take 0.5 mg by mouth 3 (three) times daily as needed for anxiety.     divalproex (DEPAKOTE) 250 MG DR tablet Take 250 mg by mouth 3 (three) times daily.     docusate sodium (COLACE) 100 MG capsule Take 100 mg by mouth 2 (two) times daily.     ferrous sulfate 325 (65 FE) MG tablet Take 1 tablet (325 mg total) by mouth daily with breakfast.     fesoterodine (TOVIAZ) 4 MG TB24 tablet Take 4 mg by mouth daily.     gabapentin (NEURONTIN) 300 MG capsule Take 300 mg by mouth 3 (three) times daily.     lactulose, encephalopathy, (CHRONULAC) 10 GM/15ML SOLN Take by mouth.     lidocaine (LIDODERM) 5 % Place 1 patch onto the skin daily. Remove & Discard patch within 12 hours or as directed by MD     losartan (COZAAR) 25 MG tablet Take 1 tablet (25 mg total) by mouth daily.     Magnesium 400 MG CAPS Take by mouth.     meclizine (ANTIVERT) 25 MG tablet Take 25 mg by mouth 3 (three) times daily as needed for dizziness.     meloxicam (MOBIC) 7.5 MG tablet Take 7.5 mg by mouth daily.     metFORMIN (GLUCOPHAGE) 1000 MG tablet Take 500 mg by mouth 2 (two) times  daily with a meal.     ondansetron (ZOFRAN) 4 MG tablet Take 4 mg by mouth every 6 (six) hours as needed for nausea or vomiting.     pantoprazole (PROTONIX) 40 MG tablet Take 40 mg by mouth 2 (two) times daily.     sertraline (ZOLOFT) 100 MG tablet Take 200 mg by mouth daily.     haloperidol (HALDOL) 5 MG tablet Take 1 tablet (5 mg total) by mouth 2 (two) times daily. 60 tablet 0   acetaminophen (TYLENOL) 325 MG tablet Take 2 tablets (650 mg total) by mouth every 6 (six) hours as needed for mild pain (or Fever >/= 101).     clonazePAM (KLONOPIN) 0.25 MG disintegrating tablet Take 1 tablet (0.25 mg total) by mouth 3 (three) times daily. 90 tablet 0   polyethylene  glycol (MIRALAX / GLYCOLAX) 17 g packet Take 17 g by mouth daily.     senna-docusate (SENOKOT-S) 8.6-50 MG tablet Take 2 tablets by mouth 2 (two) times daily. 120 tablet 0   No facility-administered medications prior to visit.    PAST MEDICAL HISTORY: Past Medical History:  Diagnosis Date   Depression    Diabetes mellitus    type 2   GERD (gastroesophageal reflux disease)    Hypertension    Hypoxemia    Mental retardation    Neuroleptic malignant syndrome    Rhabdomyolysis    Schizophrenia (HCC)    Thyroid disease    Vitamin D deficient osteomalacia     PAST SURGICAL HISTORY: Past Surgical History:  Procedure Laterality Date   COLONOSCOPY  2010   MANN    FAMILY HISTORY: No family history on file.  SOCIAL HISTORY: Social History   Socioeconomic History   Marital status: Single    Spouse name: Not on file   Number of children: Not on file   Years of education: Not on file   Highest education level: Not on file  Occupational History    Comment: disabled  Tobacco Use   Smoking status: Never   Smokeless tobacco: Never  Substance and Sexual Activity   Alcohol use: No   Drug use: No   Sexual activity: Not Currently  Other Topics Concern   Not on file  Social History Narrative   Lives in Assisted Living   Annette Martin - guardian/ [facility] POA   Social Determinants of Health   Financial Resource Strain: Not on file  Food Insecurity: Food Insecurity Present (02/15/2023)   Hunger Vital Sign    Worried About Running Out of Food in the Last Year: Sometimes true    Ran Out of Food in the Last Year: Sometimes true  Transportation Needs: No Transportation Needs (02/15/2023)   PRAPARE - Administrator, Civil Service (Medical): No    Lack of Transportation (Non-Medical): No  Physical Activity: Not on file  Stress: Not on file  Social Connections: Not on file  Intimate Partner Violence: Not At Risk (02/15/2023)   Humiliation, Afraid, Rape, and Kick questionnaire     Fear of Current or Ex-Partner: No    Emotionally Abused: No    Physically Abused: No    Sexually Abused: No     PHYSICAL EXAM  GENERAL EXAM/CONSTITUTIONAL: Vitals:  Vitals:   04/05/23 1115  BP: 138/86  Pulse: 84  Weight: 212 lb (96.2 kg)  Height: 5\' 5"  (1.651 m)   Body mass index is 35.28 kg/m. Wt Readings from Last 3 Encounters:  04/05/23 212 lb (96.2 kg)  02/14/23 219 lb 2.2 oz (99.4 kg)  12/05/12 199 lb (90.3 kg)   Patient is in no distress; well developed, nourished and groomed; neck is supple  CARDIOVASCULAR: Examination of carotid arteries is normal; no carotid bruits Regular rate and rhythm, no murmurs Examination of peripheral vascular system by observation and palpation is normal  EYES: Ophthalmoscopic exam of optic discs and posterior segments is normal; no papilledema or hemorrhages No results found.  MUSCULOSKELETAL: Gait, strength, tone, movements noted in Neurologic exam below  NEUROLOGIC: MENTAL STATUS:      No data to display         awake, alert, oriented to Martin, place and time recent and remote memory intact normal attention and concentration language fluent, comprehension intact, naming intact fund of knowledge appropriate  CRANIAL NERVE:  2nd - no papilledema on fundoscopic exam 2nd, 3rd, 4th, 6th - pupils equal and reactive to light, visual fields full to confrontation, extraocular muscles intact, no nystagmus 5th - facial sensation symmetric 7th - facial strength symmetric 8th - hearing intact 9th - palate elevates symmetrically, uvula midline 11th - shoulder shrug symmetric 12th - tongue protrusion midline MASKED FACIES  MOTOR:  COGWHEELING RIGIDITY IN LUE > RUE BRADYKINESIA IN BUE AND BLE RESTING TREMOR IN LUE > RUE  SENSORY:  normal and symmetric to light touch, temperature, vibration  COORDINATION:  finger-nose-finger, fine finger movements SLOW  REFLEXES:  deep tendon reflexes 1+ and  symmetric  GAIT/STATION:  NEEDS 2 Martin ASSIST TO STAND; STOOPED POSUTRE; SHORT STEPS; UNSTEADY     DIAGNOSTIC DATA (LABS, IMAGING, TESTING) - I reviewed patient records, labs, notes, testing and imaging myself where available.  Lab Results  Component Value Date   WBC 4.3 02/18/2023   HGB 10.1 (L) 02/18/2023   HCT 32.9 (L) 02/18/2023   MCV 79.3 (L) 02/18/2023   PLT 253 02/18/2023      Component Value Date/Time   NA 136 02/18/2023 0806   K 3.9 02/18/2023 0806   CL 97 (L) 02/18/2023 0806   CO2 26 02/18/2023 0806   GLUCOSE 168 (H) 02/18/2023 0806   BUN 15 02/18/2023 0806   CREATININE 0.94 02/18/2023 0806   CREATININE 0.82 08/03/2012 1159   CALCIUM 8.8 (L) 02/18/2023 0806   PROT 6.4 (L) 02/14/2023 1031   ALBUMIN 3.2 (L) 02/14/2023 1031   AST 18 02/14/2023 1031   ALT 19 02/14/2023 1031   ALKPHOS 86 02/14/2023 1031   BILITOT 0.3 02/14/2023 1031   GFRNONAA >60 02/18/2023 0806   GFRAA  06/29/2009 0142    >60        The eGFR has been calculated using the MDRD equation. This calculation has not been validated in all clinical situations. eGFR's persistently <60 mL/min signify possible Chronic Kidney Disease.   Lab Results  Component Value Date   CHOL 162 08/03/2012   HDL 48 08/03/2012   LDLCALC 79 08/03/2012   TRIG 177 (H) 08/03/2012   CHOLHDL 3.4 08/03/2012   Lab Results  Component Value Date   HGBA1C 7.6 (H) 02/15/2023   Lab Results  Component Value Date   VITAMINB12 403 02/14/2023   Lab Results  Component Value Date   TSH 2.341 08/03/2012    02/14/23 MRI brain  1. No acute intracranial abnormality. 2. Chronic nonspecific ventriculomegaly. Consider NPH in the appropriate clinical setting. 3. Mild for age nonspecific white matter signal changes.   ASSESSMENT AND PLAN  67 y.o. year old female here with:   Dx:  1. Parkinsonism, unspecified Parkinsonism  type [facility])      PLAN:  RESTING TREMOR, BRADYKINESIA, COGWHEELING RIGIDITY, POSTURAL  INSTABILITY (since ~2022; on haldol for > 4 years) - check DATscan (eval drug-induced parkinsonism vs idiopathic parkinson's disease) - ask psychiatry to see if haldol can be reduced or changed to new generation anti-psychiotic - continue PT at [facility] assisted living  Orders Placed This Encounter  Procedures   NM BRAIN DATSCAN TUMOR LOC INFLAM SPECT 1 DAY   Return for pending test results, pending if symptoms worsen or fail to improve.    Suanne Marker, MD 04/05/2023, 12:09 PM Certified in Neurology, Neurophysiology and Neuroimaging  [facility] [address] [place], [place] [address] [phone]

## 2023-04-05 NOTE — Patient Instructions (Signed)
  RESTING TREMOR, BRADYKINESIA, COGWHEELING RIGIDITY, POSTURAL INSTABILITY (since ~2022; on haldol for > 4 years) - ddx: drug-induced parkinsonism vs idiopathic parkinson's disease - check DATscan - ask psychiatry to see if haldol can be reduced or changed to new generation anti-psychiotic - continue PT at [place] assisted living

## 2023-04-13 ENCOUNTER — Telehealth: Payer: Self-pay | Admitting: Diagnostic Neuroimaging

## 2023-04-13 NOTE — Telephone Encounter (Signed)
no auth required sent to [facility] nuclear medicine [phone]

## 2023-06-12 ENCOUNTER — Emergency Department (HOSPITAL_COMMUNITY)
Admission: EM | Admit: 2023-06-12 | Discharge: 2023-06-13 | Disposition: A | Discharge status: Skilled Nursing Facility | Payer: Medicare PPO | Attending: Emergency Medicine | Admitting: Emergency Medicine

## 2023-06-12 ENCOUNTER — Emergency Department (HOSPITAL_COMMUNITY): Payer: Medicare PPO

## 2023-06-12 ENCOUNTER — Encounter (HOSPITAL_COMMUNITY): Payer: Self-pay

## 2023-06-12 ENCOUNTER — Other Ambulatory Visit: Payer: Self-pay

## 2023-06-12 DIAGNOSIS — M6281 Muscle weakness (generalized): Secondary | ICD-10-CM | POA: Insufficient documentation

## 2023-06-12 DIAGNOSIS — R531 Weakness: Secondary | ICD-10-CM | POA: Diagnosis not present

## 2023-06-12 DIAGNOSIS — Z9181 History of falling: Secondary | ICD-10-CM | POA: Insufficient documentation

## 2023-06-12 DIAGNOSIS — E119 Type 2 diabetes mellitus without complications: Secondary | ICD-10-CM | POA: Diagnosis not present

## 2023-06-12 DIAGNOSIS — R29810 Facial weakness: Secondary | ICD-10-CM | POA: Diagnosis present

## 2023-06-12 DIAGNOSIS — E039 Hypothyroidism, unspecified: Secondary | ICD-10-CM | POA: Diagnosis not present

## 2023-06-12 DIAGNOSIS — Z79899 Other long term (current) drug therapy: Secondary | ICD-10-CM | POA: Diagnosis not present

## 2023-06-12 DIAGNOSIS — R519 Headache, unspecified: Secondary | ICD-10-CM | POA: Insufficient documentation

## 2023-06-12 DIAGNOSIS — R29898 Other symptoms and signs involving the musculoskeletal system: Secondary | ICD-10-CM | POA: Insufficient documentation

## 2023-06-12 DIAGNOSIS — R4182 Altered mental status, unspecified: Secondary | ICD-10-CM | POA: Diagnosis present

## 2023-06-12 DIAGNOSIS — I639 Cerebral infarction, unspecified: Secondary | ICD-10-CM | POA: Diagnosis present

## 2023-06-12 LAB — COMPREHENSIVE METABOLIC PANEL
ALT: 17 U/L (ref 0–44)
AST: 21 U/L (ref 15–41)
Albumin: 3.6 g/dL (ref 3.5–5.0)
Alkaline Phosphatase: 78 U/L (ref 38–126)
Anion gap: 15 (ref 5–15)
BUN: 7 mg/dL — ABNORMAL LOW (ref 8–23)
CO2: 28 mmol/L (ref 22–32)
Calcium: 9.4 mg/dL (ref 8.9–10.3)
Chloride: 95 mmol/L — ABNORMAL LOW (ref 98–111)
Creatinine, Ser: 0.91 mg/dL (ref 0.44–1.00)
GFR, Estimated: >60 mL/min (ref >60)
Glucose, Bld: 218 mg/dL — ABNORMAL HIGH (ref 70–99)
Potassium: 3.9 mmol/L (ref 3.5–5.1)
Sodium: 138 mmol/L (ref 135–145)
Total Bilirubin: 0.6 mg/dL (ref 0.0–1.2)
Total Protein: 7.2 g/dL (ref 6.5–8.1)

## 2023-06-12 LAB — I-STAT CHEM 8, ED
BUN: 13 mg/dL (ref 8–23)
Calcium, Ion: 1.09 mmol/L — ABNORMAL LOW (ref 1.15–1.40)
Chloride: 98 mmol/L (ref 98–111)
Creatinine, Ser: 0.9 mg/dL (ref 0.44–1.00)
Glucose, Bld: 208 mg/dL — ABNORMAL HIGH (ref 70–99)
HCT: 40 % (ref 36.0–46.0)
Hemoglobin: 13.6 g/dL (ref 12.0–15.0)
Potassium: 3.9 mmol/L (ref 3.5–5.1)
Sodium: 140 mmol/L (ref 135–145)
TCO2: 28 mmol/L (ref 22–32)

## 2023-06-12 LAB — DIFFERENTIAL
Abs Immature Granulocytes: 0.05 10*3/uL (ref 0.00–0.07)
Basophils Absolute: 0.1 10*3/uL (ref 0.0–0.1)
Basophils Relative: 1 %
Eosinophils Absolute: 0.2 10*3/uL (ref 0.0–0.5)
Eosinophils Relative: 3 %
Immature Granulocytes: 1 %
Lymphocytes Relative: 11 %
Lymphs Abs: 0.7 10*3/uL (ref 0.7–4.0)
Monocytes Absolute: 0.5 10*3/uL (ref 0.1–1.0)
Monocytes Relative: 9 %
Neutro Abs: 4.5 10*3/uL (ref 1.7–7.7)
Neutrophils Relative %: 75 %

## 2023-06-12 LAB — CBG MONITORING, ED: Glucose-Capillary: 199 mg/dL — ABNORMAL HIGH (ref 70–99)

## 2023-06-12 LAB — CBC
HCT: 40 % (ref 36.0–46.0)
Hemoglobin: 13 g/dL (ref 12.0–15.0)
MCH: 28.6 pg (ref 26.0–34.0)
MCHC: 32.5 g/dL (ref 30.0–36.0)
MCV: 87.9 fL (ref 80.0–100.0)
Platelets: 208 10*3/uL (ref 150–400)
RBC: 4.55 MIL/uL (ref 3.87–5.11)
RDW: 14.2 % (ref 11.5–15.5)
WBC: 5.9 10*3/uL (ref 4.0–10.5)
nRBC: 0 % (ref 0.0–0.2)

## 2023-06-12 LAB — ETHANOL: Alcohol, Ethyl (B): <10 mg/dL (ref <10)

## 2023-06-12 LAB — PROTIME-INR
INR: 1 (ref 0.8–1.2)
Prothrombin Time: 12.9 s (ref 11.4–15.2)

## 2023-06-12 LAB — APTT: aPTT: <22 s — ABNORMAL LOW (ref 24–36)

## 2023-06-12 LAB — VALPROIC ACID LEVEL: Valproic Acid Lvl: 43 ug/mL — ABNORMAL LOW (ref 50.0–100.0)

## 2023-06-12 MED ORDER — GABAPENTIN 300 MG PO CAPS: 300.0000 mg | ORAL_CAPSULE | Freq: Once | ORAL | Status: DC

## 2023-06-12 MED ORDER — METOCLOPRAMIDE HCL 5 MG/ML IJ SOLN
10.0000 mg | Freq: Once | INTRAMUSCULAR | Status: AC
  Administered 2023-06-12: 10 mg via INTRAVENOUS
  Filled 2023-06-12: qty 2

## 2023-06-12 MED ORDER — SODIUM CHLORIDE 0.9% FLUSH
3.0000 mL | Freq: Once | INTRAVENOUS | Status: AC
  Administered 2023-06-12: 3 mL via INTRAVENOUS

## 2023-06-12 MED ORDER — ACETAMINOPHEN 500 MG PO TABS
1000.0000 mg | ORAL_TABLET | Freq: Once | ORAL | Status: AC
  Administered 2023-06-12: 1000 mg via ORAL
  Filled 2023-06-12: qty 2

## 2023-06-12 MED ORDER — METHOCARBAMOL 500 MG PO TABS: 500.0000 mg | ORAL_TABLET | Freq: Once | ORAL | Status: DC

## 2023-06-12 MED ORDER — LOSARTAN POTASSIUM 50 MG PO TABS
25.0000 mg | ORAL_TABLET | Freq: Once | ORAL | Status: AC
  Administered 2023-06-12: 25 mg via ORAL
  Filled 2023-06-12: qty 1

## 2023-06-12 NOTE — ED Triage Notes (Signed)
[facility] reports pt coming from [facility] nursing facility. Staff reports at 1045 they found pt on the floor from a fall, upon EMS arrival pt reports she hit her head on the table. EMS noted slurred speech, left side facial droop and left arm and leg weakness.

## 2023-06-12 NOTE — ED Provider Notes (Signed)
[place] EMERGENCY DEPARTMENT AT [facility] Provider Note   CSN: [id number] Arrival date & time: 06/12/23  1143  An emergency department physician performed an initial assessment on this suspected stroke patient at 1143.  History  Chief Complaint  Patient presents with   Code Stroke    Annette Martin is a 68 y.o. female.  Pt is a 67y/o female with hx of depression, T2DM, schizophrenia, hypothyroidism, HLD, GERD, urinary incontinence, and mild MR with prior history of left-sided facial droop with admission in October that showed no evidence of seizures or stroke who is presenting today from her assisted living facility after being found on the floor.  They report at 1030 they found the patient on the ground with her head laying underneath her bed.  They saw her first at 10:00 and she was not on the floor.  However when EMS arrived they report noting a slight left-sided facial droop with some pronator drift in the left arm and leg and called a code stroke.  Patient has complained of headache and some nausea.  She was initially found to be hypertensive with blood pressures in the 200s systolics but those have improved to the 180s per EMS and blood sugar was normal.  No other report was given  The history is provided by the patient, the nursing home, medical records and the EMS personnel. The history is limited by the absence of a caregiver.       Home Medications Prior to Admission medications   Medication Sig Start Date End Date Taking? Authorizing Provider  amantadine (SYMMETREL) 100 MG capsule Take 100 mg by mouth 2 (two) times daily.    [provider]  atorvastatin (LIPITOR) 10 MG tablet Take 10 mg by mouth at bedtime.    [provider]  bethanechol (URECHOLINE) 25 MG tablet Take 25 mg by mouth 3 (three) times daily.    [provider]  clonazePAM (KLONOPIN) 0.5 MG tablet Take 0.5 mg by mouth 3 (three) times daily as needed for anxiety.     [provider]  divalproex (DEPAKOTE) 250 MG DR tablet Take 250 mg by mouth 3 (three) times daily.    [provider]  docusate sodium (COLACE) 100 MG capsule Take 100 mg by mouth 2 (two) times daily.    [provider]  ferrous sulfate 325 (65 FE) MG tablet Take 1 tablet (325 mg total) by mouth daily with breakfast. 02/20/23   Gwenevere Abbot, MD  fesoterodine (TOVIAZ) 4 MG TB24 tablet Take 4 mg by mouth daily.    [provider]  gabapentin (NEURONTIN) 300 MG capsule Take 300 mg by mouth 3 (three) times daily.    [provider]  haloperidol (HALDOL) 5 MG tablet Take 1 tablet (5 mg total) by mouth 2 (two) times daily. 02/19/23 03/21/23  Gwenevere Abbot, MD  lactulose, encephalopathy, (CHRONULAC) 10 GM/15ML SOLN Take by mouth.    [provider]  lidocaine (LIDODERM) 5 % Place 1 patch onto the skin daily. Remove & Discard patch within 12 hours or as directed by MD 02/20/23   Gwenevere Abbot, MD  losartan (COZAAR) 25 MG tablet Take 1 tablet (25 mg total) by mouth daily. 02/20/23   Gwenevere Abbot, MD  Magnesium 400 MG CAPS Take by mouth.    [provider]  meclizine (ANTIVERT) 25 MG tablet Take 25 mg by mouth 3 (three) times daily as needed for dizziness.    [provider]  meloxicam (MOBIC) 7.5  MG tablet Take 7.5 mg by mouth daily.    [provider]  metFORMIN (GLUCOPHAGE) 1000 MG tablet Take 500 mg by mouth 2 (two) times daily with a meal.    [provider]  ondansetron (ZOFRAN) 4 MG tablet Take 4 mg by mouth every 6 (six) hours as needed for nausea or vomiting.    [provider]  pantoprazole (PROTONIX) 40 MG tablet Take 40 mg by mouth 2 (two) times daily.    [provider]  sertraline (ZOLOFT) 100 MG tablet Take 200 mg by mouth daily.    [provider]      Allergies    Patient has no known allergies.    Review of Systems   Review of Systems  Physical Exam Updated Vital Signs BP  (!) 166/113   Pulse 89   Temp 98.2 F (36.8 C) (Oral)   Resp 19   Wt 94.4 kg   SpO2 99%   BMI 34.63 kg/m  Physical Exam Vitals and nursing note reviewed.  Constitutional:      General: She is not in acute distress.    Appearance: She is well-developed.  HENT:     Head: Normocephalic and atraumatic.  Eyes:     Pupils: Pupils are equal, round, and reactive to light.  Cardiovascular:     Rate and Rhythm: Normal rate and regular rhythm.     Heart sounds: Normal heart sounds. No murmur heard.    No friction rub.  Pulmonary:     Effort: Pulmonary effort is normal.     Breath sounds: Normal breath sounds. No wheezing or rales.  Abdominal:     General: Bowel sounds are normal. There is no distension.     Palpations: Abdomen is soft.     Tenderness: There is no abdominal tenderness. There is no guarding or rebound.  Musculoskeletal:        General: No tenderness. Normal range of motion.     Right lower leg: No edema.     Left lower leg: No edema.     Comments: No edema  Skin:    General: Skin is warm and dry.     Findings: No rash.  Neurological:     Mental Status: She is alert and oriented to person, place, and time.     Cranial Nerves: No cranial nerve deficit.     Comments: Mild left-sided facial droop, 4 out of 5 strength in left upper and lower extremity with mild pronator drift  Psychiatric:        Behavior: Behavior normal.     ED Results / Procedures / Treatments   Labs (all labs ordered are listed, but only abnormal results are displayed) Labs Reviewed  APTT - Abnormal; Notable for the following components:      Result Value   aPTT <22 (*)    All other components within normal limits  COMPREHENSIVE METABOLIC PANEL - Abnormal; Notable for the following components:   Chloride 95 (*)    Glucose, Bld 218 (*)    BUN 7 (*)    All other components within normal limits  VALPROIC ACID LEVEL - Abnormal; Notable for the following components:   Valproic Acid Lvl 43 (*)     All other components within normal limits  CBG MONITORING, ED - Abnormal; Notable for the following components:   Glucose-Capillary 199 (*)    All other components within normal limits  I-STAT CHEM 8, ED - Abnormal; Notable for the following components:  Glucose, Bld 208 (*)    Calcium, Ion 1.09 (*)    All other components within normal limits  PROTIME-INR  CBC  DIFFERENTIAL  ETHANOL  URINALYSIS, ROUTINE W REFLEX MICROSCOPIC  RAPID URINE DRUG SCREEN, HOSP PERFORMED  CBG MONITORING, ED    EKG EKG Interpretation Date/Time:  Saturday June 12 2023 12:57:14 EST Ventricular Rate:  89 PR Interval:  142 QRS Duration:  86 QT Interval:  357 QTC Calculation: 435 R Axis:   10  Text Interpretation: Sinus rhythm Probable left atrial enlargement No significant change since last tracing Confirmed by Gwyneth Sprout ([id number]) on 06/12/2023 12:58:41 PM  Radiology MR BRAIN WO CONTRAST Result Date: 06/12/2023 CLINICAL DATA:  68 year old female neurologic deficit. Code stroke. Slurred speech and left side weakness. EXAM: MRI HEAD WITHOUT CONTRAST TECHNIQUE: Multiplanar, multiecho pulse sequences of the brain and surrounding structures were obtained without intravenous contrast. COMPARISON:  Plain head CT today 1151 hours.  Brain MRI 02/14/2023. FINDINGS: Brain: No restricted diffusion or evidence of acute infarction. Chronic and stable nonspecific ventriculomegaly. Generalized as before and some evidence of hyperdynamic flow at the cerebral aqueduct. Stable cerebral volume. No midline shift, intracranial mass effect, extra-axial collection or acute intracranial hemorrhage. Cervicomedullary junction and pituitary are within normal limits. Scattered cerebral white matter T2 and FLAIR hyperintensity is stable, mostly subcortical, and mild or at most moderate for age. No cortical encephalomalacia or chronic cerebral blood products identified. Deep gray matter nuclei, brainstem, cerebellum signal remains  normal. Compared to a 2020 [facility] MRI some brainstem volume loss is suspected (series 9, image 13 today). Vascular: Major intracranial vascular flow voids are stable from last year. Skull and upper cervical spine: Stable, negative. Sinuses/Orbits: Stable, negative. Other: Visible internal auditory structures appear normal. Mastoids remain well aerated. Negative visible scalp and face. IMPRESSION: 1. No evidence of acute infarct or acute intracranial abnormality. 2. Stable noncontrast MRI of the brain notable for: - chronic and nonspecific ventriculomegaly, - mild for age cerebral white matter signal changes, - possibly disproportionate brainstem volume loss since 2020. Electronically Signed   By: Odessa Fleming M.D.   On: 06/12/2023 12:34   CT HEAD CODE STROKE WO CONTRAST Result Date: 06/12/2023 CLINICAL DATA:  Code stroke. Neuro deficit, acute, stroke suspected. EXAM: CT HEAD WITHOUT CONTRAST TECHNIQUE: Contiguous axial images were obtained from the base of the skull through the vertex without intravenous contrast. RADIATION DOSE REDUCTION: This exam was performed according to the departmental dose-optimization program which includes automated exposure control, adjustment of the mA and/or kV according to patient size and/or use of iterative reconstruction technique. COMPARISON:  CT head without contrast 04/21/2023. FINDINGS: Brain: Moderate atrophy and mild white matter changes bilaterally are stable. No acute infarct, hemorrhage, or mass lesion is present. The ventricles are proportionate to the degree of atrophy. No significant extraaxial fluid collection is present. The brainstem and cerebellum are within normal limits. Midline structures are within normal limits. Vascular: No hyperdense vessel or unexpected calcification. Skull: Calvarium is intact. No focal lytic or blastic lesions are present. No significant extracranial soft tissue lesion is present. Sinuses/Orbits: The paranasal sinuses and mastoid  air cells are clear. Bilateral lens replacements are noted. Globes and orbits are otherwise unremarkable. ASPECTS [facility] Stroke Program Early CT Score) - Ganglionic level infarction (caudate, lentiform nuclei, internal capsule, insula, M1-M3 cortex): 7/7 - Supraganglionic infarction (M4-M6 cortex): 3/3 Total score (0-10 with 10 being normal): 10/10 IMPRESSION: 1. No acute intracranial abnormality or significant interval change. 2. Stable atrophy and white matter  disease. This likely reflects the sequela of chronic microvascular ischemia. 3. Aspects 10/10 The above was relayed via text pager to Dr. Rollene Fare on 06/12/2023 at 12:06 pm . Electronically Signed   By: Marin Roberts M.D.   On: 06/12/2023 12:07    Procedures Procedures    Medications Ordered in ED Medications  sodium chloride flush (NS) 0.9 % injection 3 mL (3 mLs Intravenous Given 06/12/23 1237)  metoCLOPramide (REGLAN) injection 10 mg (10 mg Intravenous Given 06/12/23 1237)  acetaminophen (TYLENOL) tablet 1,000 mg (1,000 mg Oral Given 06/12/23 1440)  losartan (COZAAR) tablet 25 mg (25 mg Oral Given 06/12/23 1440)    ED Course/ Medical Decision Making/ A&P                                 Medical Decision Making Amount and/or Complexity of Data Reviewed Independent Historian: EMS External Data Reviewed: notes. Labs: ordered. Decision-making details documented in ED Course. Radiology: ordered and independent interpretation performed. Decision-making details documented in ED Course. ECG/medicine tests: ordered and independent interpretation performed. Decision-making details documented in ED Course.  Risk OTC drugs. Prescription drug management.   Pt with multiple medical problems and comorbidities and presenting today with a complaint that caries a high risk for morbidity and mortality.  Here today with the above symptoms concerns for possible stroke vs infection vs NPH vs TIA vs trauma vs medication related encephalopathy  vs seizures vs complex migraine versus press from hypertensive urgency.  Upon arrival stroke team is at the patient's bedside she went directly to CT.  3:12 PM I independently.  Patient's labs and EKG.  EKG without acute findings, Chem-8, coags, CBC and CMP without acute findings except for hyperglycemia of 200..  I have independently visualized and interpreted pt's images today.  CT of the head without acute intracranial bleed and radiology reports no acute interval change with stable atrophy and MRI with stable findings of chronic and nonspecific ventriculomegaly with mild for age cerebral right matter signal changes and disproportionate volume loss.  Depakote level was pending and patient was given Reglan in case there may be a migraine component.  Waiting on neuro's recommendations.  3:12 PM Depakote levels are low at 43.  Patient is moving both arms without difficulty and equally at this time.  She has no left-sided facial droop.  She is eating without difficulty.  Blood pressure is improving.  Neurology did not feel that she needed any further workup given her negative workup here and if her symptoms have resolved.  It does appear the patient is back to baseline at this time.          Final Clinical Impression(s) / ED Diagnoses Final diagnoses:  Transient weakness of left lower extremity  Acute intractable headache, unspecified headache type    Rx / DC Orders ED Discharge Orders     None         Gwyneth Sprout, MD 06/12/23 1512

## 2023-06-12 NOTE — Discharge Instructions (Signed)
The MRI, CAT scan of the brain and all the blood work today was reassuring.  Your symptoms may have been related to your blood pressure or a migraine headache.  Continue your current medications at this time.

## 2023-06-12 NOTE — ED Notes (Signed)
PTAR called; ninth in line

## 2023-06-12 NOTE — Code Documentation (Signed)
Stroke Response Nurse Documentation Code Documentation  Annette Martin is a 68 y.o. female arriving to [facility]  via [facility] on 06/12/2023. On No antithrombotic. Code stroke was activated by EMS.   Patient from [facility] where she was LKW yesterday and now complaining of slurred speech, left sided weakness, left facial droop. Pt found partially under bed by staff. When nursing staff checked on her at 1000 she was noted to be altered.    Stroke team at the bedside on patient arrival. Labs drawn and patient cleared for CT by Dr. Anitra Lauth. Patient to CT with team.   NIHSS 6, see documentation for details and code stroke times. Patient with disoriented, left facial droop, bilateral leg weakness, and dysarthria  on exam.   The following imaging was completed:  CT Head and MRI.   Patient is not a candidate for IV Thrombolytic due to LKW yesterday. Patient is not a candidate for IR due to imaging and assessment negative for LVO.   Care Plan: Code stroke cancelled.   Bedside handoff with ED RN Delice Bison.    Shakia Sebastiano L Aparna Vanderweele  Rapid Response RN

## 2023-06-12 NOTE — ED Notes (Signed)
PT stated she had to use the bathroom, pt was asked if she wanted to go on a bedpan, pt agreed. Pt placed on bedpan and then stated that she didn't have to urinate anymore.

## 2023-06-12 NOTE — Consult Note (Signed)
NEUROLOGY CONSULT NOTE   Date of service: June 12, 2023 Patient Name: Annette Martin MRN:  [id number] DOB:  28-Mar-1956 Chief Complaint: Code Stroke History is obtained from: patient, EMS personnel, and past medical records  History of Present Illness  Annette Martin is a 68 y.o. female  has a past medical history of Depression, Diabetes mellitus, GERD (gastroesophageal reflux disease), Hypertension, Hypoxemia, Mental retardation, Neuroleptic malignant syndrome, Rhabdomyolysis, Schizophrenia (HCC), Thyroid disease, and Vitamin D deficient osteomalacia. who presents with  dysarthria, chest pain, left sided weakness. Found on the ground partially under bed by staff and EMS was initially called for chest pain. On EMS exam they noted a left sided drift, BP 220/100, glucose 199.  At 1000 nursing staff switched shifts. Nurse starting at 10am stated that she was told the morning nurse went to give her her medications and she spit them all out and then began throwing them at the morning nurse. At baseline she is typically alert and oriented times 4 but in a wheelchair most of the time. Ms. Meinders tells me that she slid off of her bed and landed on the floor.  She also tells me that she did not take her medications this morning because she did not want to.  Repeat blood pressure is 156/134.  She is afebrile.  WBCs 5.9.  Glucose 218.  CMP within normal limits.   She did have a similar presentation in September 2024 with left-sided weakness and altered mental status.  She did miss her medications that morning as well. At that time her stroke workup was negative and she followed up with [facility] outpatient.  She is currently following up with Dr. Marjory Lies.   LKW: yesterday Modified rankin score: 4-Needs assistance to walk and tend to bodily needs IV Thrombolysis: No, outside of window EVT: No, no LVO   1a Level of Conscious.: 0 1b LOC Questions: 2 1c LOC Commands: 0 2 Best  Gaze: 0 3 Visual: 0 4 Facial Palsy: 1 5a Motor Arm - left: 0 5b Motor Arm - Right: 0 6a Motor Leg - Left: 1 6b Motor Leg - Right: 1 7 Limb Ataxia: 0 8 Sensory: 0 9 Best Language: 0 10 Dysarthria: 1 11 Extinct. and Inatten.: 0 TOTAL: 6    ROS   Unable to ascertain due to altered mental status   Past History   Past Medical History:  Diagnosis Date   Depression    Diabetes mellitus    type 2   GERD (gastroesophageal reflux disease)    Hypertension    Hypoxemia    Mental retardation    Neuroleptic malignant syndrome    Rhabdomyolysis    Schizophrenia (HCC)    Thyroid disease    Vitamin D deficient osteomalacia     Past Surgical History:  Procedure Laterality Date   COLONOSCOPY  2010   MANN    Family History: No family history on file.  Social History  reports that she has never smoked. She has never used smokeless tobacco. She reports that she does not drink alcohol and does not use drugs.  No Known Allergies  Medications   Current Facility-Administered Medications:    sodium chloride flush (NS) 0.9 % injection 3 mL, 3 mL, Intravenous, Once, Plunkett, Whitney, MD  Current Outpatient Medications:    amantadine (SYMMETREL) 100 MG capsule, Take 100 mg by mouth 2 (two) times daily., Disp: , Rfl:    atorvastatin (LIPITOR) 10 MG tablet, Take 10 mg by mouth at  bedtime., Disp: , Rfl:    bethanechol (URECHOLINE) 25 MG tablet, Take 25 mg by mouth 3 (three) times daily., Disp: , Rfl:    clonazePAM (KLONOPIN) 0.5 MG tablet, Take 0.5 mg by mouth 3 (three) times daily as needed for anxiety., Disp: , Rfl:    divalproex (DEPAKOTE) 250 MG DR tablet, Take 250 mg by mouth 3 (three) times daily., Disp: , Rfl:    docusate sodium (COLACE) 100 MG capsule, Take 100 mg by mouth 2 (two) times daily., Disp: , Rfl:    ferrous sulfate 325 (65 FE) MG tablet, Take 1 tablet (325 mg total) by mouth daily with breakfast., Disp: , Rfl:    fesoterodine (TOVIAZ) 4 MG TB24 tablet, Take 4 mg by  mouth daily., Disp: , Rfl:    gabapentin (NEURONTIN) 300 MG capsule, Take 300 mg by mouth 3 (three) times daily., Disp: , Rfl:    haloperidol (HALDOL) 5 MG tablet, Take 1 tablet (5 mg total) by mouth 2 (two) times daily., Disp: 60 tablet, Rfl: 0   lactulose, encephalopathy, (CHRONULAC) 10 GM/15ML SOLN, Take by mouth., Disp: , Rfl:    lidocaine (LIDODERM) 5 %, Place 1 patch onto the skin daily. Remove & Discard patch within 12 hours or as directed by MD, Disp: , Rfl:    losartan (COZAAR) 25 MG tablet, Take 1 tablet (25 mg total) by mouth daily., Disp: , Rfl:    Magnesium 400 MG CAPS, Take by mouth., Disp: , Rfl:    meclizine (ANTIVERT) 25 MG tablet, Take 25 mg by mouth 3 (three) times daily as needed for dizziness., Disp: , Rfl:    meloxicam (MOBIC) 7.5 MG tablet, Take 7.5 mg by mouth daily., Disp: , Rfl:    metFORMIN (GLUCOPHAGE) 1000 MG tablet, Take 500 mg by mouth 2 (two) times daily with a meal., Disp: , Rfl:    ondansetron (ZOFRAN) 4 MG tablet, Take 4 mg by mouth every 6 (six) hours as needed for nausea or vomiting., Disp: , Rfl:    pantoprazole (PROTONIX) 40 MG tablet, Take 40 mg by mouth 2 (two) times daily., Disp: , Rfl:    sertraline (ZOLOFT) 100 MG tablet, Take 200 mg by mouth daily., Disp: , Rfl:   Vitals  There were no vitals filed for this visit.  There is no height or weight on file to calculate BMI.  Physical Exam   Constitutional: Appears well-developed and well-nourished.  Psych: Affect appropriate to situation.  Eyes: No scleral injection.  HENT: No OP obstruction.  Head: Normocephalic. Cardiovascular: Normal rate and regular rhythm.  Respiratory: Effort normal, non-labored breathing.  GI: Soft.  No distension. There is no tenderness.  Skin: WDI.   Neurologic Examination   Neuro: Mental Status: Patient is awake, states name, incorrect age, states "I don't know" for month  Cranial Nerves: II: Visual Fields are full. Pupils are equal, round, and reactive to light.    III,IV, VI: EOMI without ptosis or diploplia.  V: Facial sensation is symmetric to temperature VII: Facial movement is symmetric resting and smiling VIII: Hearing is intact to voice X: Palate elevates symmetrically XI: Shoulder shrug is symmetric. XII: Tongue protrudes midline without atrophy or fasciculations.  Motor: Tone is normal. Bulk is normal. 5/5 strength was present in all four extremities.  Sensory: Sensation is symmetric to light touch and temperature in the arms and legs.  Cerebellar: Tremor in bilateral upper extremities, baseline     Labs/Imaging/Neurodiagnostic studies   CBC:  Recent Labs  Lab 06/17/2023  1151 06/12/23 1152  WBC 5.9  --   NEUTROABS 4.5  --   HGB 13.0 13.6  HCT 40.0 40.0  MCV 87.9  --   PLT 208  --     Basic Metabolic Panel:  Lab Results  Component Value Date   NA 140 06/12/2023   K 3.9 06/12/2023   CO2 28 06/12/2023   GLUCOSE 208 (H) 06/12/2023   BUN 13 06/12/2023   CREATININE 0.90 06/12/2023   CALCIUM 9.4 06/12/2023   GFRNONAA >60 06/12/2023   GFRAA  06/29/2009    >60        The eGFR has been calculated using the MDRD equation. This calculation has not been validated in all clinical situations. eGFR's persistently <60 mL/min signify possible Chronic Kidney Disease.    Lipid Panel:  Lab Results  Component Value Date   LDLCALC 79 08/03/2012    HgbA1c:  Lab Results  Component Value Date   HGBA1C 7.6 (H) 02/15/2023    Urine Drug Screen:     Component Value Date/Time   LABOPIA NTD 06/29/2009 0218   COCAINSCRNUR NTD 06/29/2009 0218   LABBENZ NTD 06/29/2009 0218   AMPHETMU NTD 06/29/2009 0218   THCU NTD 06/29/2009 0218   LABBARB NTD 06/29/2009 0218     Alcohol Level     Component Value Date/Time   ETH <10 02/14/2023 1003    INR  Lab Results  Component Value Date   INR 1.0 02/14/2023    APTT  Lab Results  Component Value Date   APTT 21 (L) 02/14/2023    AED levels: No results found for: "PHENYTOIN",  "ZONISAMIDE", "LAMOTRIGINE", "LEVETIRACETA"    Code Stroke CT Head without contrast(Personally reviewed): 1. No acute intracranial abnormality or significant interval change. 2. Stable atrophy and white matter disease. This likely reflects the sequela of chronic microvascular ischemia. 3. Aspects 10/10  MRI Brain(Personally reviewed): No evidence of acute infarct or acute intracranial abnormality. Stable noncontrast MRI of the brain notable for: - chronic and nonspecific ventriculomegaly, - mild for age cerebral white matter signal changes, - possibly disproportionate brainstem volume loss since 2020.   ASSESSMENT   HILDE CHURCHMAN is a 67 y.o. female  has a past medical history of Depression, Diabetes mellitus, GERD (gastroesophageal reflux disease), Hypertension, Hypoxemia, Mental retardation, Neuroleptic malignant syndrome, Rhabdomyolysis, Schizophrenia (HCC), Thyroid disease, and Vitamin D deficient osteomalacia.  She initially presented as a code stroke with reported left-sided weakness and altered mental status.  She does remember sliding off of her bed this morning and she tells me that she did not take her medications this morning because she did not want to. She recalls laying on the floor prior to being found. She is not able to tell me the her age, month, or the year.   Depakote level pending as is a UA.  MRI negative for an acute infarct.  Code stroke canceled. Encephalopathy in the setting of missed medications vs metabolic encephalopathy due to an acute process, behavioral spell or complex headache favored.  Also consider the possibility of focal seizure but this is felt to be less likely given the description by staff as well as description by the patient  RECOMMENDATIONS  - Depakote level pending -- neurology will follow-up Depakote level and make any suggestions for adjustment if needed - UA and any further infectious/metabolic workup per EDP - Appreciate headache management  per ED - If she returns to and remains at baseline, okay for discharge and outpatient follow-up from a  neurology perspective - Follow up with PCP and [facility] outpatient as planned ______________________________________________________________  Patient seen and examined by NP/APP with MD. MD to update note as needed.   Elmer Picker, DNP, FNP-BC [facility] Pager: [phone]  Attending Neurologist's note:  I personally saw this patient, gathering history, performing a full neurologic examination, reviewing relevant labs, personally reviewing relevant imaging including Head CT, MRI brain, and formulated the assessment and plan, adding the note above for completeness and clarity to accurately reflect my thoughts  CRITICAL CARE Performed by: Gordy Councilman   Total critical care time: 40 minutes  Critical care time was exclusive of separately billable procedures and treating other patients.  Critical care was necessary to treat or prevent imminent or life-threatening deterioration, emergent evaluation for consideration of thrombectomy or thrombolytic.  Critical care was time spent personally by me on the following activities: development of treatment plan with patient and/or surrogate as well as nursing, discussions with consultants, evaluation of patient's response to treatment, examination of patient, obtaining history from patient or surrogate, ordering and performing treatments and interventions, ordering and review of laboratory studies, ordering and review of radiographic studies, pulse oximetry and re-evaluation of patient's condition.

## 2023-06-20 ENCOUNTER — Emergency Department (HOSPITAL_COMMUNITY): Payer: Medicare PPO

## 2023-06-20 ENCOUNTER — Inpatient Hospital Stay (HOSPITAL_COMMUNITY)
Admission: EM | Admit: 2023-06-20 | Discharge: 2023-06-28 | DRG: 193 | Disposition: A | Discharge status: Skilled Nursing Facility | Payer: Medicare PPO | Source: Skilled Nursing Facility | Attending: Internal Medicine | Admitting: Internal Medicine

## 2023-06-20 ENCOUNTER — Other Ambulatory Visit: Payer: Self-pay

## 2023-06-20 DIAGNOSIS — R400 Somnolence: Secondary | ICD-10-CM

## 2023-06-20 DIAGNOSIS — G9341 Metabolic encephalopathy: Secondary | ICD-10-CM | POA: Diagnosis not present

## 2023-06-20 DIAGNOSIS — E1140 Type 2 diabetes mellitus with diabetic neuropathy, unspecified: Secondary | ICD-10-CM | POA: Diagnosis present

## 2023-06-20 DIAGNOSIS — F209 Schizophrenia, unspecified: Secondary | ICD-10-CM | POA: Diagnosis present

## 2023-06-20 DIAGNOSIS — R0902 Hypoxemia: Secondary | ICD-10-CM | POA: Diagnosis present

## 2023-06-20 DIAGNOSIS — M549 Dorsalgia, unspecified: Secondary | ICD-10-CM | POA: Diagnosis present

## 2023-06-20 DIAGNOSIS — E860 Dehydration: Secondary | ICD-10-CM | POA: Insufficient documentation

## 2023-06-20 DIAGNOSIS — R4182 Altered mental status, unspecified: Secondary | ICD-10-CM | POA: Diagnosis not present

## 2023-06-20 DIAGNOSIS — G259 Extrapyramidal and movement disorder, unspecified: Secondary | ICD-10-CM

## 2023-06-20 DIAGNOSIS — R32 Unspecified urinary incontinence: Secondary | ICD-10-CM | POA: Diagnosis present

## 2023-06-20 DIAGNOSIS — J101 Influenza due to other identified influenza virus with other respiratory manifestations: Secondary | ICD-10-CM | POA: Diagnosis not present

## 2023-06-20 DIAGNOSIS — I10 Essential (primary) hypertension: Secondary | ICD-10-CM | POA: Diagnosis present

## 2023-06-20 DIAGNOSIS — E785 Hyperlipidemia, unspecified: Secondary | ICD-10-CM | POA: Diagnosis present

## 2023-06-20 DIAGNOSIS — Z7984 Long term (current) use of oral hypoglycemic drugs: Secondary | ICD-10-CM

## 2023-06-20 DIAGNOSIS — G9340 Encephalopathy, unspecified: Secondary | ICD-10-CM | POA: Diagnosis present

## 2023-06-20 DIAGNOSIS — Z79899 Other long term (current) drug therapy: Secondary | ICD-10-CM

## 2023-06-20 DIAGNOSIS — E876 Hypokalemia: Secondary | ICD-10-CM | POA: Diagnosis present

## 2023-06-20 DIAGNOSIS — Z1152 Encounter for screening for COVID-19: Secondary | ICD-10-CM

## 2023-06-20 DIAGNOSIS — F79 Unspecified intellectual disabilities: Secondary | ICD-10-CM | POA: Diagnosis present

## 2023-06-20 DIAGNOSIS — K219 Gastro-esophageal reflux disease without esophagitis: Secondary | ICD-10-CM | POA: Diagnosis present

## 2023-06-20 DIAGNOSIS — E039 Hypothyroidism, unspecified: Secondary | ICD-10-CM | POA: Diagnosis present

## 2023-06-20 DIAGNOSIS — T43505A Adverse effect of unspecified antipsychotics and neuroleptics, initial encounter: Secondary | ICD-10-CM | POA: Diagnosis present

## 2023-06-20 DIAGNOSIS — G2119 Other drug induced secondary parkinsonism: Secondary | ICD-10-CM | POA: Diagnosis present

## 2023-06-20 DIAGNOSIS — Z791 Long term (current) use of non-steroidal anti-inflammatories (NSAID): Secondary | ICD-10-CM

## 2023-06-20 DIAGNOSIS — F32A Depression, unspecified: Secondary | ICD-10-CM | POA: Diagnosis present

## 2023-06-20 LAB — COMPREHENSIVE METABOLIC PANEL
ALT: 21 U/L (ref 0–44)
AST: 35 U/L (ref 15–41)
Albumin: 3.4 g/dL — ABNORMAL LOW (ref 3.5–5.0)
Alkaline Phosphatase: 73 U/L (ref 38–126)
Anion gap: 14 (ref 5–15)
BUN: 19 mg/dL (ref 8–23)
CO2: 28 mmol/L (ref 22–32)
Calcium: 8.9 mg/dL (ref 8.9–10.3)
Chloride: 97 mmol/L — ABNORMAL LOW (ref 98–111)
Creatinine, Ser: 1.04 mg/dL — ABNORMAL HIGH (ref 0.44–1.00)
GFR, Estimated: 60 mL/min — ABNORMAL LOW (ref >60)
Glucose, Bld: 122 mg/dL — ABNORMAL HIGH (ref 70–99)
Potassium: 3.8 mmol/L (ref 3.5–5.1)
Sodium: 139 mmol/L (ref 135–145)
Total Bilirubin: 0.6 mg/dL (ref 0.0–1.2)
Total Protein: 6.7 g/dL (ref 6.5–8.1)

## 2023-06-20 LAB — I-STAT VENOUS BLOOD GAS, ED
Acid-Base Excess: 9 mmol/L — ABNORMAL HIGH (ref 0.0–2.0)
Bicarbonate: 34.6 mmol/L — ABNORMAL HIGH (ref 20.0–28.0)
Calcium, Ion: 1.09 mmol/L — ABNORMAL LOW (ref 1.15–1.40)
HCT: 37 % (ref 36.0–46.0)
Hemoglobin: 12.6 g/dL (ref 12.0–15.0)
O2 Saturation: 52 %
Potassium: 3.8 mmol/L (ref 3.5–5.1)
Sodium: 139 mmol/L (ref 135–145)
TCO2: 36 mmol/L — ABNORMAL HIGH (ref 22–32)
pCO2, Ven: 51.8 mm[Hg] (ref 44–60)
pH, Ven: 7.432 — ABNORMAL HIGH (ref 7.25–7.43)
pO2, Ven: 28 mm[Hg] — CL (ref 32–45)

## 2023-06-20 LAB — URINALYSIS, W/ REFLEX TO CULTURE (INFECTION SUSPECTED)
Bacteria, UA: NONE SEEN
Glucose, UA: NEGATIVE mg/dL
Hgb urine dipstick: NEGATIVE
Ketones, ur: 5 mg/dL — AB
Leukocytes,Ua: NEGATIVE
Nitrite: NEGATIVE
Protein, ur: 100 mg/dL — AB
Specific Gravity, Urine: 1.035 — ABNORMAL HIGH (ref 1.005–1.030)
pH: 5 (ref 5.0–8.0)

## 2023-06-20 LAB — CBC WITH DIFFERENTIAL/PLATELET
Abs Immature Granulocytes: 0.03 10*3/uL (ref 0.00–0.07)
Basophils Absolute: 0 10*3/uL (ref 0.0–0.1)
Basophils Relative: 0 %
Eosinophils Absolute: 0 10*3/uL (ref 0.0–0.5)
Eosinophils Relative: 1 %
HCT: 38.1 % (ref 36.0–46.0)
Hemoglobin: 12.2 g/dL (ref 12.0–15.0)
Immature Granulocytes: 1 %
Lymphocytes Relative: 25 %
Lymphs Abs: 1.1 10*3/uL (ref 0.7–4.0)
MCH: 28.6 pg (ref 26.0–34.0)
MCHC: 32 g/dL (ref 30.0–36.0)
MCV: 89.2 fL (ref 80.0–100.0)
Monocytes Absolute: 0.8 10*3/uL (ref 0.1–1.0)
Monocytes Relative: 20 %
Neutro Abs: 2.2 10*3/uL (ref 1.7–7.7)
Neutrophils Relative %: 53 %
Platelets: 173 10*3/uL (ref 150–400)
RBC: 4.27 MIL/uL (ref 3.87–5.11)
RDW: 14.6 % (ref 11.5–15.5)
WBC: 4.2 10*3/uL (ref 4.0–10.5)
nRBC: 0 % (ref 0.0–0.2)

## 2023-06-20 LAB — I-STAT CG4 LACTIC ACID, ED: Lactic Acid, Venous: 1.9 mmol/L (ref 0.5–1.9)

## 2023-06-20 LAB — RESP PANEL BY RT-PCR (RSV, FLU A&B, COVID)  RVPGX2
Influenza A by PCR: POSITIVE — AB
Influenza B by PCR: NEGATIVE
Resp Syncytial Virus by PCR: NEGATIVE
SARS Coronavirus 2 by RT PCR: NEGATIVE

## 2023-06-20 LAB — VALPROIC ACID LEVEL: Valproic Acid Lvl: 70 ug/mL (ref 50.0–100.0)

## 2023-06-20 LAB — AMMONIA: Ammonia: 20 umol/L (ref 9–35)

## 2023-06-20 MED ORDER — ATORVASTATIN CALCIUM 10 MG PO TABS
10.0000 mg | ORAL_TABLET | Freq: Every day | ORAL | Status: DC
  Administered 2023-06-22 – 2023-06-27 (×7): 10 mg via ORAL
  Filled 2023-06-20 (×7): qty 1

## 2023-06-20 MED ORDER — CLONAZEPAM 0.5 MG PO TABS
0.5000 mg | ORAL_TABLET | Freq: Three times a day (TID) | ORAL | Status: DC | PRN
  Administered 2023-06-21 – 2023-06-22 (×3): 0.5 mg via ORAL
  Filled 2023-06-20 (×3): qty 1

## 2023-06-20 MED ORDER — ONDANSETRON HCL 4 MG/2ML IJ SOLN: 4.0000 mg | Freq: Four times a day (QID) | INTRAMUSCULAR | Status: DC | PRN

## 2023-06-20 MED ORDER — ACETAMINOPHEN 500 MG PO TABS
1000.0000 mg | ORAL_TABLET | Freq: Four times a day (QID) | ORAL | Status: DC | PRN
  Administered 2023-06-22: 1000 mg via ORAL
  Filled 2023-06-20: qty 2

## 2023-06-20 MED ORDER — LACTULOSE 10 GM/15ML PO SOLN: 10.0000 g | Freq: Every day | ORAL | Status: DC | PRN

## 2023-06-20 MED ORDER — OSELTAMIVIR PHOSPHATE 75 MG PO CAPS
75.0000 mg | ORAL_CAPSULE | Freq: Two times a day (BID) | ORAL | Status: AC
  Administered 2023-06-21 – 2023-06-25 (×10): 75 mg via ORAL
  Filled 2023-06-20 (×10): qty 1

## 2023-06-20 MED ORDER — LACTATED RINGERS IV BOLUS
1000.0000 mL | Freq: Once | INTRAVENOUS | Status: AC
  Administered 2023-06-20: 1000 mL via INTRAVENOUS

## 2023-06-20 MED ORDER — INSULIN ASPART 100 UNIT/ML IJ SOLN: 0.0000 [IU] | Freq: Three times a day (TID) | INTRAMUSCULAR | Status: DC

## 2023-06-20 MED ORDER — FERROUS SULFATE 325 (65 FE) MG PO TABS
325.0000 mg | ORAL_TABLET | Freq: Every day | ORAL | Status: DC
  Administered 2023-06-21 – 2023-06-28 (×8): 325 mg via ORAL
  Filled 2023-06-20 (×8): qty 1

## 2023-06-20 MED ORDER — MELATONIN 3 MG PO TABS
6.0000 mg | ORAL_TABLET | Freq: Every evening | ORAL | Status: DC | PRN
  Administered 2023-06-24 – 2023-06-27 (×4): 6 mg via ORAL
  Filled 2023-06-20 (×4): qty 2

## 2023-06-20 MED ORDER — ALBUTEROL SULFATE (2.5 MG/3ML) 0.083% IN NEBU: 2.5000 mg | INHALATION_SOLUTION | RESPIRATORY_TRACT | Status: DC | PRN

## 2023-06-20 MED ORDER — HALOPERIDOL 1 MG PO TABS
5.0000 mg | ORAL_TABLET | Freq: Three times a day (TID) | ORAL | Status: DC
  Administered 2023-06-21 (×2): 5 mg via ORAL
  Filled 2023-06-20 (×3): qty 5

## 2023-06-20 MED ORDER — PANTOPRAZOLE SODIUM 40 MG PO TBEC
40.0000 mg | DELAYED_RELEASE_TABLET | Freq: Two times a day (BID) | ORAL | Status: DC
  Administered 2023-06-21 – 2023-06-28 (×15): 40 mg via ORAL
  Filled 2023-06-20 (×16): qty 1

## 2023-06-20 MED ORDER — DIVALPROEX SODIUM 250 MG PO DR TAB
250.0000 mg | DELAYED_RELEASE_TABLET | Freq: Three times a day (TID) | ORAL | Status: DC
  Administered 2023-06-21 – 2023-06-28 (×22): 250 mg via ORAL
  Filled 2023-06-20 (×24): qty 1

## 2023-06-20 MED ORDER — LACTATED RINGERS IV SOLN: INTRAVENOUS | Status: AC

## 2023-06-20 MED ORDER — DOCUSATE SODIUM 100 MG PO CAPS
100.0000 mg | ORAL_CAPSULE | Freq: Two times a day (BID) | ORAL | Status: DC
  Administered 2023-06-21 – 2023-06-28 (×15): 100 mg via ORAL
  Filled 2023-06-20 (×15): qty 1

## 2023-06-20 MED ORDER — FESOTERODINE FUMARATE ER 4 MG PO TB24
4.0000 mg | ORAL_TABLET | Freq: Every day | ORAL | Status: DC
  Administered 2023-06-22 – 2023-06-27 (×7): 4 mg via ORAL
  Filled 2023-06-20 (×8): qty 1

## 2023-06-20 MED ORDER — AMANTADINE HCL 100 MG PO CAPS
100.0000 mg | ORAL_CAPSULE | Freq: Two times a day (BID) | ORAL | Status: DC
  Administered 2023-06-21 – 2023-06-28 (×15): 100 mg via ORAL
  Filled 2023-06-20 (×16): qty 1

## 2023-06-20 MED ORDER — SERTRALINE HCL 100 MG PO TABS
200.0000 mg | ORAL_TABLET | Freq: Every day | ORAL | Status: DC
  Administered 2023-06-22 – 2023-06-27 (×7): 200 mg via ORAL
  Filled 2023-06-20 (×7): qty 2

## 2023-06-20 MED ORDER — POLYETHYLENE GLYCOL 3350 17 G PO PACK: 17.0000 g | PACK | Freq: Every day | ORAL | Status: DC | PRN

## 2023-06-20 MED ORDER — BETHANECHOL CHLORIDE 25 MG PO TABS
25.0000 mg | ORAL_TABLET | Freq: Three times a day (TID) | ORAL | Status: DC
  Administered 2023-06-21 – 2023-06-28 (×22): 25 mg via ORAL
  Filled 2023-06-20 (×24): qty 1

## 2023-06-20 NOTE — ED Triage Notes (Signed)
Pt BIB  EMS from [facility] due to altered mental status.  Facility reports leaning started yesterday.  LKW 06/18/23. VSS.

## 2023-06-20 NOTE — H&P (Addendum)
History and Physical    Annette Martin ZOX:[id number] DOB: 02-Jun-1955 DOA: 06/20/2023  PCP: Galvin Proffer, MD   Patient coming from: Home   Chief Complaint:  AMS   HPI: History limited obtained by chart review. Annette Martin is a 68 y.o. female with hx of schizophrenia, diabetes, hyperlipidemia, hypothyroidism, urinary incontinence, who was brought in from [facility] for altered mental status.  Per ED report patient was leaning to one side since 1/31.  Noted to be somnolent.  On my interview initially wakes to voice and then requires for stimulation to stay awake.  She has no outward complaints.   Review of Systems:  Unable to complete due to mental status.  No Known Allergies  Prior to Admission medications   Medication Sig Start Date End Date Taking? Authorizing Provider  lactulose (CHRONULAC) 10 GM/15ML solution Take 10 g by mouth daily as needed for mild constipation.   Yes [provider]  amantadine (SYMMETREL) 100 MG capsule Take 100 mg by mouth 2 (two) times daily.    [provider]  atorvastatin (LIPITOR) 10 MG tablet Take 10 mg by mouth at bedtime.    [provider]  bethanechol (URECHOLINE) 25 MG tablet Take 25 mg by mouth 3 (three) times daily.    [provider]  clonazePAM (KLONOPIN) 0.5 MG tablet Take 0.5 mg by mouth 3 (three) times daily as needed for anxiety.    [provider]  divalproex (DEPAKOTE) 250 MG DR tablet Take 250 mg by mouth 3 (three) times daily.    [provider]  docusate sodium (COLACE) 100 MG capsule Take 100 mg by mouth 2 (two) times daily.    [provider]  ferrous sulfate 325 (65 FE) MG tablet Take 1 tablet (325 mg total) by mouth daily with breakfast. 02/20/23   Gwenevere Abbot, MD  fesoterodine (TOVIAZ) 4 MG TB24 tablet Take 4 mg by mouth at bedtime.    [provider]  gabapentin (NEURONTIN) 300 MG capsule Take 300 mg by mouth 3 (three) times daily.    [provider]  haloperidol (HALDOL) 5 MG tablet Take 1 tablet (5 mg total) by mouth 2 (two) times daily. Patient taking differently: Take 5 mg by mouth 3 (three) times daily. 02/19/23 03/21/23  Gwenevere Abbot, MD  lactulose, encephalopathy, (CHRONULAC) 10 GM/15ML SOLN Take by mouth.    [provider]  lidocaine (LIDODERM) 5 % Place 1 patch onto the skin daily. Remove & Discard patch within 12 hours or as directed by MD 02/20/23   Gwenevere Abbot, MD  losartan (COZAAR) 25 MG tablet Take 1 tablet (25 mg total) by mouth daily. 02/20/23   Gwenevere Abbot, MD  Magnesium 400 MG CAPS Take 1 tablet by mouth 2 (two) times daily.    [provider]  meclizine (ANTIVERT) 25 MG tablet Take 25 mg by mouth 3 (three) times daily as needed for dizziness.    [provider]  meloxicam (MOBIC) 7.5 MG tablet Take 7.5 mg by mouth daily.    [provider]  metFORMIN (GLUCOPHAGE) 1000 MG tablet Take 1,000 mg by mouth 2 (two) times daily with a meal.    [provider]  ondansetron (ZOFRAN) 4 MG tablet Take 4 mg by mouth every 6 (six) hours as needed for nausea or vomiting.    [provider]  pantoprazole (PROTONIX) 40 MG tablet Take 40 mg by mouth 2 (two) times daily.    [provider]  sertraline (ZOLOFT) 100 MG tablet Take 200 mg by mouth at bedtime.    [provider]    Past Medical History:  Diagnosis Date   Depression    Diabetes mellitus    type 2   GERD (gastroesophageal reflux disease)    Hypertension    Hypoxemia    Mental retardation    Neuroleptic malignant syndrome    Rhabdomyolysis    Schizophrenia (HCC)    Thyroid disease    Vitamin D deficient osteomalacia     Past Surgical History:  Procedure Laterality Date   COLONOSCOPY  2010   MANN     reports that she has never smoked. She has never used smokeless tobacco. She reports that she does not drink alcohol and does not use drugs.  No family history on file.   Physical  Exam: Vitals:   06/20/23 2130 06/20/23 2200 06/20/23 2213 06/20/23 2214  BP: (!) 141/62 127/82    Pulse:  80 81 81  Resp: 17 (!) 29 17 (!) 24  Temp:      TempSrc:      SpO2:  100% 97% 98%    Gen: Somnolent, initially arouses to voice.  Later during exam requires moderate stimulation to awaken.  Falls asleep easily.  NAD HEENT: Dry oral mucosa.  Poor dentition. CV: Regular, normal S1, S2, no murmurs  Resp: Normal WOB, CTAB  Abd: Obese, normoactive, nontender MSK: Symmetric, no edema  Skin: No rashes or lesions to exposed skin  Neuro: Somnolent, initially arouses to voice.  Later during exam requires moderate stimulation to awaken.  Falls asleep easily.  Does not answer to orientation questions.  CN exam limited, PERRL, no facial droop.  Motor full grip strength.  Able to lift legs antigravity.  No clonus at the ankles. Psych: Encephalopathic   Data review:   Labs reviewed, notable for:   VBG 7.43/51, bicarb 28 Lactate 1.9 Ammonia within normal limit UA positive ketone, positive hyaline cast  Micro:  Results for orders placed or performed during the hospital encounter of 06/20/23  Resp panel by RT-PCR (RSV, Flu A&B, Covid) Anterior Nasal Swab     Status: Abnormal   Collection Time: 06/20/23  7:59 PM   Specimen: Anterior Nasal Swab  Result Value Ref Range Status   SARS Coronavirus 2 by RT PCR NEGATIVE NEGATIVE Final   Influenza A by PCR POSITIVE (A) NEGATIVE Final   Influenza B by PCR NEGATIVE NEGATIVE Final    Comment: (NOTE) The Xpert Xpress SARS-CoV-2/FLU/RSV plus assay is intended as an aid in the diagnosis of influenza from Nasopharyngeal swab specimens and should not be used as a sole basis for treatment. Nasal washings and aspirates are unacceptable for Xpert Xpress SARS-CoV-2/FLU/RSV testing.  Fact Sheet for Patients: BloggerCourse.com  Fact Sheet for Healthcare Providers: SeriousBroker.it  This test is not yet  approved or cleared by the [place] FDA and has been authorized for detection and/or diagnosis of SARS-CoV-2 by FDA under an Emergency Use Authorization (EUA). This EUA will remain in effect (meaning this test can be used) for the duration of the COVID-19 declaration under Section 564(b)(1) of the Act, 21 U.S.C. section 360bbb-3(b)(1), unless the authorization is terminated or revoked.     Resp Syncytial Virus by PCR NEGATIVE NEGATIVE Final    Comment: (NOTE) Fact Sheet for Patients: BloggerCourse.com  Fact Sheet for Healthcare Providers: SeriousBroker.it  This test is not yet approved or cleared by the [place] FDA and has been authorized for detection and/or  diagnosis of SARS-CoV-2 by FDA under an Emergency Use Authorization (EUA). This EUA will remain in effect (meaning this test can be used) for the duration of the COVID-19 declaration under Section 564(b)(1) of the Act, 21 U.S.C. section 360bbb-3(b)(1), unless the authorization is terminated or revoked.  Performed at [facility], [address] [place], [place] [address]     Imaging reviewed:  [facility] Chest Port 1 View Result Date: 06/20/2023 CLINICAL DATA:  Altered mental status. EXAM: PORTABLE CHEST 1 VIEW COMPARISON:  02/14/2023 FINDINGS: Shallow inspiration. Heart size and pulmonary vascularity are normal. Lungs are clear. No pleural effusions. No pneumothorax. Mediastinal contours appear intact. Degenerative changes in the spine and shoulders. Surgical clips in the right upper quadrant. IMPRESSION: Shallow inspiration.  No evidence of active pulmonary disease. Electronically Signed   By: Burman Nieves M.D.   On: 06/20/2023 20:28   CT Head Wo Contrast Result Date: 06/20/2023 CLINICAL DATA:  Mental status change EXAM: CT HEAD WITHOUT CONTRAST TECHNIQUE: Contiguous axial images were obtained from the base of the skull through the vertex without intravenous  contrast. RADIATION DOSE REDUCTION: This exam was performed according to the departmental dose-optimization program which includes automated exposure control, adjustment of the mA and/or kV according to patient size and/or use of iterative reconstruction technique. COMPARISON:  CT 06/12/2023, MRI 06/12/2023, 10/01/2022, 04/21/2023 FINDINGS: Brain: No acute territorial infarction, hemorrhage, or intracranial mass. Mild atrophy. Stable ventriculomegaly. Mild chronic small vessel ischemic changes of the white matter Vascular: No hyperdense vessel or unexpected calcification. Skull: Normal. Negative for fracture or focal lesion. Sinuses/Orbits: No acute finding. Other: None IMPRESSION: 1. No CT evidence for acute intracranial abnormality. 2. Mild atrophy and chronic small vessel ischemic changes of the white matter. Stable ventriculomegaly. Electronically Signed   By: Jasmine Pang M.D.   On: 06/20/2023 19:52   ED Course:  Treated with 1 L IV fluid and started on rate of 125 cc/h.  Referral for admit due to her persistent encephalopathy and decreased intake   Assessment/Plan:  68 y.o. female with hx schizophrenia, diabetes, hyperlipidemia, hypothyroidism, urinary incontinence, who was brought in from [facility] for altered mental status.  Found to have influenza A infection.  Encephalopathy acute, metabolic Encephalopathic since 1/31.  Reportedly leading to one side per ALF.  Exam with prominent somnolence, no focal deficits noted.  Lab evaluation notable for positive flu A, findings of dehydration with borderline lactate, UA with ketone and hyaline cast. CT head negative for acute findings.  Suspect her encephalopathy is multifactorial related to her influenza A infection, dehydration, medication effects -Management of influenza A per below -Management of dehydration per below -Hold her evening dose of gabapentin and clonazepam; otherwise continue home psychotropic meds.  Influenza A without  significant pneumonia - Tamiflu 75 mg twice daily for 5 days - Albuterol prn, incentive spirometer  Dehydration Deconditioning - S/p 1 L IV fluid in the ED.  Continue LR at 100 cc/h overnight then reassess -PT evaluation  Chronic medical problems: History of schizophrenia: Continue home Haldol, valproate, sertraline. Hold evening dose of Clonazepam ?  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  Glucose within goal range can add SSI if runs high. ? Neuropathy: Hold gabapentin for now  HLD: Continue home atorvastatin Hypothyroidism: Check TSH, not on thyroid supplementation. Urinary incontinence: Continue home bethanechol 3 times daily and fesoteridine nightly GERD: home PPI   There is no height or weight on file to calculate BMI.    DVT prophylaxis:  SCDs Code Status:  Full Code Diet:  Diet Orders (From admission, onward)     Start     Ordered   06/20/23 2331  Diet regular Room service appropriate? Yes; Fluid consistency: Thin  Diet effective now       Question Answer Comment  Room service appropriate? Yes   Fluid consistency: Thin      06/20/23 2330           Family Communication:  No; attempted to call guardian Hessie Diener Malia but no answer    Consults:  None   Admission status:   Observation, Med-Surg  Severity of Illness: The appropriate patient status for this patient is OBSERVATION. Observation status is judged to be reasonable and necessary in order to provide the required intensity of service to ensure the patient's safety. The patient's presenting symptoms, physical exam findings, and initial radiographic and laboratory data in the context of their medical condition is felt to place them at decreased risk for further clinical deterioration. Furthermore, it is anticipated that the patient will be medically stable for discharge from the hospital within 2 midnights of admission.    Dolly Rias, MD [facility]  How to  contact the [facility] Attending or Consulting provider 7A - 7P or covering provider during after hours 7P -7A, for this patient.  Check the care team in [facility] and look for a) attending/consulting [facility] provider listed and b) the [facility] team listed Log into [email or web address] and use [facility]'s universal password to access. If you do not have the password, please contact the hospital operator. Locate the [facility] provider you are looking for under [facility] and page to a number that you can be directly reached. If you still have difficulty reaching the provider, please page the [facility] (Director on Call) for the Hospitalists listed on amion for assistance.  06/20/2023, 11:58 PM

## 2023-06-20 NOTE — ED Provider Notes (Signed)
[place] EMERGENCY DEPARTMENT AT [facility] Provider Note   CSN: [id number] Arrival date & time: 06/20/23  1902     History  No chief complaint on file.   Annette Martin is a 68 y.o. female.  Pt is a 67y/o female with hx of depression, T2DM, schizophrenia, hypothyroidism, HLD, GERD, urinary incontinence, and mild MR with prior history of left-sided facial droop with admission in October that showed no evidence of seizures or stroke who is presenting today from her assisted living facility today with altered mental status.  Facility had reported patient had started leaning yesterday and was last her normal self on 06/18/2023.  Patient is very drowsy on exam for me and will not cooperate with exam other than opening her eyes and at times mumbling at a single word.  She denies chest pain, shortness of breath abdominal pain or nausea.  The history is provided by the EMS personnel, the nursing home and medical records. The history is limited by the condition of the patient.       Home Medications Prior to Admission medications   Medication Sig Start Date End Date Taking? Authorizing Provider  amantadine (SYMMETREL) 100 MG capsule Take 100 mg by mouth 2 (two) times daily.    [provider]  atorvastatin (LIPITOR) 10 MG tablet Take 10 mg by mouth at bedtime.    [provider]  bethanechol (URECHOLINE) 25 MG tablet Take 25 mg by mouth 3 (three) times daily.    [provider]  clonazePAM (KLONOPIN) 0.5 MG tablet Take 0.5 mg by mouth 3 (three) times daily as needed for anxiety.    [provider]  divalproex (DEPAKOTE) 250 MG DR tablet Take 250 mg by mouth 3 (three) times daily.    [provider]  docusate sodium (COLACE) 100 MG capsule Take 100 mg by mouth 2 (two) times daily.    [provider]  ferrous sulfate 325 (65 FE) MG tablet Take 1 tablet (325 mg total) by mouth daily with breakfast. 02/20/23   Gwenevere Abbot, MD   fesoterodine (TOVIAZ) 4 MG TB24 tablet Take 4 mg by mouth daily.    [provider]  gabapentin (NEURONTIN) 300 MG capsule Take 300 mg by mouth 3 (three) times daily.    [provider]  haloperidol (HALDOL) 5 MG tablet Take 1 tablet (5 mg total) by mouth 2 (two) times daily. 02/19/23 03/21/23  Gwenevere Abbot, MD  lactulose, encephalopathy, (CHRONULAC) 10 GM/15ML SOLN Take by mouth.    [provider]  lidocaine (LIDODERM) 5 % Place 1 patch onto the skin daily. Remove & Discard patch within 12 hours or as directed by MD 02/20/23   Gwenevere Abbot, MD  losartan (COZAAR) 25 MG tablet Take 1 tablet (25 mg total) by mouth daily. 02/20/23   Gwenevere Abbot, MD  Magnesium 400 MG CAPS Take by mouth.    [provider]  meclizine (ANTIVERT) 25 MG tablet Take 25 mg by mouth 3 (three) times daily as needed for dizziness.    [provider]  meloxicam (MOBIC) 7.5 MG tablet Take 7.5 mg by mouth daily.    [provider]  metFORMIN (GLUCOPHAGE) 1000 MG tablet Take 500 mg by mouth 2 (two) times daily with a meal.    [provider]  ondansetron (ZOFRAN) 4 MG tablet Take 4 mg by mouth every 6 (six) hours as needed for nausea or vomiting.    [provider]  pantoprazole (PROTONIX) 40 MG  tablet Take 40 mg by mouth 2 (two) times daily.    [provider]  sertraline (ZOLOFT) 100 MG tablet Take 200 mg by mouth daily.    [provider]      Allergies    Patient has no known allergies.    Review of Systems   Review of Systems  Physical Exam Updated Vital Signs BP 127/82   Pulse 81   Temp 99.1 F (37.3 C) (Rectal)   Resp (!) 24   SpO2 98%  Physical Exam Vitals and nursing note reviewed.  Constitutional:      General: She is not in acute distress.    Appearance: She is well-developed.     Comments: Somnolent but will wake up to voice  HENT:     Head: Normocephalic and atraumatic.  Eyes:     Pupils: Pupils are equal,  round, and reactive to light.     Comments: Pupils are 3 to 4 mm and reactive bilaterally  Cardiovascular:     Rate and Rhythm: Normal rate and regular rhythm.     Heart sounds: Normal heart sounds. No murmur heard.    No friction rub.  Pulmonary:     Effort: Pulmonary effort is normal.     Breath sounds: Normal breath sounds. No wheezing or rales.  Abdominal:     General: Bowel sounds are normal. There is no distension.     Palpations: Abdomen is soft.     Tenderness: There is no abdominal tenderness. There is no guarding or rebound.  Musculoskeletal:        General: No tenderness. Normal range of motion.     Right lower leg: Edema present.     Left lower leg: Edema present.     Comments: No edema.  Trace edema bilaterally  Skin:    General: Skin is warm and dry.     Findings: No rash.  Neurological:     Mental Status: She is alert.     Comments: Patient will answer some questions but will not follow commands.  Will not move any of her extremities upon request  Psychiatric:        Behavior: Behavior normal.     ED Results / Procedures / Treatments   Labs (all labs ordered are listed, but only abnormal results are displayed) Labs Reviewed  RESP PANEL BY RT-PCR (RSV, FLU A&B, COVID)  RVPGX2 - Abnormal; Notable for the following components:      Result Value   Influenza A by PCR POSITIVE (*)    All other components within normal limits  COMPREHENSIVE METABOLIC PANEL - Abnormal; Notable for the following components:   Chloride 97 (*)    Glucose, Bld 122 (*)    Creatinine, Ser 1.04 (*)    Albumin 3.4 (*)    GFR, Estimated 60 (*)    All other components within normal limits  URINALYSIS, W/ REFLEX TO CULTURE (INFECTION SUSPECTED) - Abnormal; Notable for the following components:   Color, Urine AMBER (*)    APPearance HAZY (*)    Specific Gravity, Urine 1.035 (*)    Bilirubin Urine SMALL (*)    Ketones, ur 5 (*)    Protein, ur 100 (*)    All other components within normal  limits  I-STAT VENOUS BLOOD GAS, ED - Abnormal; Notable for the following components:   pH, Ven 7.432 (*)    pO2, Ven 28 (*)    Bicarbonate 34.6 (*)    TCO2 36 (*)  Acid-Base Excess 9.0 (*)    Calcium, Ion 1.09 (*)    All other components within normal limits  CBC WITH DIFFERENTIAL/PLATELET  VALPROIC ACID LEVEL  AMMONIA  I-STAT CG4 LACTIC ACID, ED    EKG EKG Interpretation Date/Time:  Sunday June 20 2023 19:12:53 EST Ventricular Rate:  86 PR Interval:  146 QRS Duration:  91 QT Interval:  370 QTC Calculation: 443 R Axis:   -6  Text Interpretation: Sinus rhythm Baseline wander in lead(s) I III aVL No significant change since last tracing Confirmed by Gwyneth Sprout ([id number]) on 06/20/2023 8:50:51 PM  Radiology DG Chest Port 1 View Result Date: 06/20/2023 CLINICAL DATA:  Altered mental status. EXAM: PORTABLE CHEST 1 VIEW COMPARISON:  02/14/2023 FINDINGS: Shallow inspiration. Heart size and pulmonary vascularity are normal. Lungs are clear. No pleural effusions. No pneumothorax. Mediastinal contours appear intact. Degenerative changes in the spine and shoulders. Surgical clips in the right upper quadrant. IMPRESSION: Shallow inspiration.  No evidence of active pulmonary disease. Electronically Signed   By: Burman Nieves M.D.   On: 06/20/2023 20:28   CT Head Wo Contrast Result Date: 06/20/2023 CLINICAL DATA:  Mental status change EXAM: CT HEAD WITHOUT CONTRAST TECHNIQUE: Contiguous axial images were obtained from the base of the skull through the vertex without intravenous contrast. RADIATION DOSE REDUCTION: This exam was performed according to the departmental dose-optimization program which includes automated exposure control, adjustment of the mA and/or kV according to patient size and/or use of iterative reconstruction technique. COMPARISON:  CT 06/12/2023, MRI 06/12/2023, 10/01/2022, 04/21/2023 FINDINGS: Brain: No acute territorial infarction, hemorrhage, or intracranial mass.  Mild atrophy. Stable ventriculomegaly. Mild chronic small vessel ischemic changes of the white matter Vascular: No hyperdense vessel or unexpected calcification. Skull: Normal. Negative for fracture or focal lesion. Sinuses/Orbits: No acute finding. Other: None IMPRESSION: 1. No CT evidence for acute intracranial abnormality. 2. Mild atrophy and chronic small vessel ischemic changes of the white matter. Stable ventriculomegaly. Electronically Signed   By: Jasmine Pang M.D.   On: 06/20/2023 19:52    Procedures Procedures    Medications Ordered in ED Medications  lactated ringers infusion ( Intravenous New Bag/Given 06/20/23 2257)  lactated ringers bolus 1,000 mL (1,000 mLs Intravenous New Bag/Given 06/20/23 2248)    ED Course/ Medical Decision Making/ A&P                                 Medical Decision Making Amount and/or Complexity of Data Reviewed External Data Reviewed: notes. Labs: ordered. Decision-making details documented in ED Course. Radiology: ordered and independent interpretation performed. Decision-making details documented in ED Course. ECG/medicine tests: ordered and independent interpretation performed. Decision-making details documented in ED Course.  Risk Prescription drug management. Decision regarding hospitalization.   Pt with multiple medical problems and comorbidities and presenting today with a complaint that caries a high risk for morbidity and mortality.  Here today with altered mental status.  Concern for infectious etiology versus intracranial pathology versus hyperammonemia versus hypercarbia or possible AKI, electrolyte abnormalities.  Patient is hemodynamically stable at this time but temperature is 99.2.  Will get a rectal temperature.  Patient will respond but is sluggish.  11:29 PM I independently interpreted patient's labs and EKG.  EKG without acute changes, UA within normal limits, lactic acid normal, VBG with mild metabolic alkalosis, CBC and CMP  without acute findings, ammonia and valproic acid are normal.  Patient's viral panel was positive for  flu A.  I have independently visualized and interpreted pt's images today.  Chest x-ray without acute findings today.  No evidence of pneumonia, head CT without evidence of acute pathology.  Radiology reports mild atrophy and small chronic vessel ischemia in the white matter matter and stable ventriculomegaly but no acute changes.  11:29 PM On reevaluation patient is still lethargic but will answer a few questions.  Reports she has not eaten all day and still feeling very weak in the setting of being flu positive.  Patient does take multiple sedating medications but none of them are new.  Will admit due to the new altered mental status, poor oral intake, flu.  Low suspicion for meningitis at this time.         Final Clinical Impression(s) / ED Diagnoses Final diagnoses:  Influenza A  Somnolence  Dehydration    Rx / DC Orders ED Discharge Orders     None         Gwyneth Sprout, MD 06/20/23 2329

## 2023-06-20 NOTE — H&P (Incomplete)
History and Physical    Annette Martin ZOX:[id number] DOB: 1956-05-12 DOA: 06/20/2023  PCP: Galvin Proffer, MD   Patient coming from: Home   Chief Complaint:  AMS   HPI: History limited obtained by chart review. Annette Martin is a 68 y.o. female with hx of schizophrenia, diabetes, hyperlipidemia, hypothyroidism, urinary incontinence, who was brought in from [facility] for altered mental status.  Per ED report patient was leaning to one side since yesterday 1/31.  Noted to be somnolent.  On my interview initially wakes to voice and then requires for stimulation to stay awake.  She has no outward complaints.   Review of Systems:  Unable to complete due to mental status.  No Known Allergies  Prior to Admission medications   Medication Sig Start Date End Date Taking? Authorizing Provider  lactulose (CHRONULAC) 10 GM/15ML solution Take 10 g by mouth daily as needed for mild constipation.   Yes [provider]  amantadine (SYMMETREL) 100 MG capsule Take 100 mg by mouth 2 (two) times daily.    [provider]  atorvastatin (LIPITOR) 10 MG tablet Take 10 mg by mouth at bedtime.    [provider]  bethanechol (URECHOLINE) 25 MG tablet Take 25 mg by mouth 3 (three) times daily.    [provider]  clonazePAM (KLONOPIN) 0.5 MG tablet Take 0.5 mg by mouth 3 (three) times daily as needed for anxiety.    [provider]  divalproex (DEPAKOTE) 250 MG DR tablet Take 250 mg by mouth 3 (three) times daily.    [provider]  docusate sodium (COLACE) 100 MG capsule Take 100 mg by mouth 2 (two) times daily.    [provider]  ferrous sulfate 325 (65 FE) MG tablet Take 1 tablet (325 mg total) by mouth daily with breakfast. 02/20/23   Gwenevere Abbot, MD  fesoterodine (TOVIAZ) 4 MG TB24 tablet Take 4 mg by mouth at bedtime.    [provider]  gabapentin (NEURONTIN) 300 MG capsule Take 300 mg by mouth 3 (three) times daily.     [provider]  haloperidol (HALDOL) 5 MG tablet Take 1 tablet (5 mg total) by mouth 2 (two) times daily. Patient taking differently: Take 5 mg by mouth 3 (three) times daily. 02/19/23 03/21/23  Gwenevere Abbot, MD  lactulose, encephalopathy, (CHRONULAC) 10 GM/15ML SOLN Take by mouth.    [provider]  lidocaine (LIDODERM) 5 % Place 1 patch onto the skin daily. Remove & Discard patch within 12 hours or as directed by MD 02/20/23   Gwenevere Abbot, MD  losartan (COZAAR) 25 MG tablet Take 1 tablet (25 mg total) by mouth daily. 02/20/23   Gwenevere Abbot, MD  Magnesium 400 MG CAPS Take 1 tablet by mouth 2 (two) times daily.    [provider]  meclizine (ANTIVERT) 25 MG tablet Take 25 mg by mouth 3 (three) times daily as needed for dizziness.    [provider]  meloxicam (MOBIC) 7.5 MG tablet Take 7.5 mg by mouth daily.    [provider]  metFORMIN (GLUCOPHAGE) 1000 MG tablet Take 1,000 mg by mouth 2 (two) times daily with a meal.    [provider]  ondansetron (ZOFRAN) 4 MG tablet Take 4 mg by mouth every 6 (six) hours as needed for nausea or vomiting.    [provider]  pantoprazole (PROTONIX) 40 MG tablet Take 40 mg by mouth 2 (two) times daily.    [provider]  sertraline (ZOLOFT) 100 MG tablet Take 200 mg by mouth at bedtime.    [provider]    Past Medical History:  Diagnosis Date  . Depression   . Diabetes mellitus    type 2  . GERD (gastroesophageal reflux disease)   . Hypertension   . Hypoxemia   . Mental retardation   . Neuroleptic malignant syndrome   . Rhabdomyolysis   . Schizophrenia (HCC)   . Thyroid disease   . Vitamin D deficient osteomalacia     Past Surgical History:  Procedure Laterality Date  . COLONOSCOPY  2010   MANN     reports that she has never smoked. She has never used smokeless tobacco. She reports that she does not drink alcohol and does not use drugs.  No family history  on file.   Physical Exam: Vitals:   06/20/23 2130 06/20/23 2200 06/20/23 2213 06/20/23 2214  BP: (!) 141/62 127/82    Pulse:  80 81 81  Resp: 17 (!) 29 17 (!) 24  Temp:      TempSrc:      SpO2:  100% 97% 98%    Gen: Somnolent, initially arouses to voice.  Later during exam requires moderate stimulation to awaken.  Falls asleep easily.  NAD HEENT: Dry oral mucosa.  Poor dentition. CV: Regular, normal S1, S2, no murmurs  Resp: Normal WOB, CTAB  Abd: Obese, normoactive, nontender MSK: Symmetric, no edema  Skin: No rashes or lesions to exposed skin  Neuro: Somnolent, initially arouses to voice.  Later during exam requires moderate stimulation to awaken.  Falls asleep easily.  Does not answer to orientation questions.  CN exam limited, PERRL, no facial droop.  Motor full grip strength.  Able to lift legs antigravity.  No clonus at the ankles. Psych: Encephalopathic   Data review:   Labs reviewed, notable for:   VBG 7.43/51, bicarb 28 Lactate 1.9 Ammonia within normal limit UA positive ketone, positive hyaline cast  Micro:  Results for orders placed or performed during the hospital encounter of 06/20/23  Resp panel by RT-PCR (RSV, Flu A&B, Covid) Anterior Nasal Swab     Status: Abnormal   Collection Time: 06/20/23  7:59 PM   Specimen: Anterior Nasal Swab  Result Value Ref Range Status   SARS Coronavirus 2 by RT PCR NEGATIVE NEGATIVE Final   Influenza A by PCR POSITIVE (A) NEGATIVE Final   Influenza B by PCR NEGATIVE NEGATIVE Final    Comment: (NOTE) The Xpert Xpress SARS-CoV-2/FLU/RSV plus assay is intended as an aid in the diagnosis of influenza from Nasopharyngeal swab specimens and should not be used as a sole basis for treatment. Nasal washings and aspirates are unacceptable for Xpert Xpress SARS-CoV-2/FLU/RSV testing.  Fact Sheet for Patients: BloggerCourse.com  Fact Sheet for Healthcare  Providers: SeriousBroker.it  This test is not yet approved or cleared by the [place] FDA and has been authorized for detection and/or diagnosis of SARS-CoV-2 by FDA under an Emergency Use Authorization (EUA). This EUA will remain in effect (meaning this test can be used) for the duration of the COVID-19 declaration under Section 564(b)(1) of the Act, 21 U.S.C. section 360bbb-3(b)(1), unless the authorization is terminated or revoked.     Resp Syncytial Virus by PCR NEGATIVE NEGATIVE Final    Comment: (NOTE) Fact Sheet for Patients: BloggerCourse.com  Fact Sheet for Healthcare Providers: SeriousBroker.it  This test is not yet approved or cleared by the [place] FDA and has been authorized for detection and/or  diagnosis of SARS-CoV-2 by FDA under an Emergency Use Authorization (EUA). This EUA will remain in effect (meaning this test can be used) for the duration of the COVID-19 declaration under Section 564(b)(1) of the Act, 21 U.S.C. section 360bbb-3(b)(1), unless the authorization is terminated or revoked.  Performed at [facility], [address], [place], [place] [address]     Imaging reviewed:  [facility] Chest Port 1 View Result Date: 06/20/2023 CLINICAL DATA:  Altered mental status. EXAM: PORTABLE CHEST 1 VIEW COMPARISON:  02/14/2023 FINDINGS: Shallow inspiration. Heart size and pulmonary vascularity are normal. Lungs are clear. No pleural effusions. No pneumothorax. Mediastinal contours appear intact. Degenerative changes in the spine and shoulders. Surgical clips in the right upper quadrant. IMPRESSION: Shallow inspiration.  No evidence of active pulmonary disease. Electronically Signed   By: Burman Nieves M.D.   On: 06/20/2023 20:28   CT Head Wo Contrast Result Date: 06/20/2023 CLINICAL DATA:  Mental status change EXAM: CT HEAD WITHOUT CONTRAST TECHNIQUE: Contiguous axial images were  obtained from the base of the skull through the vertex without intravenous contrast. RADIATION DOSE REDUCTION: This exam was performed according to the departmental dose-optimization program which includes automated exposure control, adjustment of the mA and/or kV according to patient size and/or use of iterative reconstruction technique. COMPARISON:  CT 06/12/2023, MRI 06/12/2023, 10/01/2022, 04/21/2023 FINDINGS: Brain: No acute territorial infarction, hemorrhage, or intracranial mass. Mild atrophy. Stable ventriculomegaly. Mild chronic small vessel ischemic changes of the white matter Vascular: No hyperdense vessel or unexpected calcification. Skull: Normal. Negative for fracture or focal lesion. Sinuses/Orbits: No acute finding. Other: None IMPRESSION: 1. No CT evidence for acute intracranial abnormality. 2. Mild atrophy and chronic small vessel ischemic changes of the white matter. Stable ventriculomegaly. Electronically Signed   By: Jasmine Pang M.D.   On: 06/20/2023 19:52   ED Course:  Treated with 1 L IV fluid and started on rate of 125 cc/h.  Referral for admit due to her persistent encephalopathy and decreased intake   Assessment/Plan:  68 y.o. female with hx ***   There is no height or weight on file to calculate BMI. ***   DVT prophylaxis:  {Blank single:19197::"Lovenox","SQ Heparin","IV heparin gtts","Xarelto","Eliquis","Coumadin","SCDs","***"} Code Status:  {Blank single:19197::"Full Code","DNR with Intubation","DNR/DNI(Do NOT Intubate)","Comfort Care","***"} Diet:  Diet Orders (From admission, onward)     Start     Ordered   06/20/23 2331  Diet regular Room service appropriate? Yes; Fluid consistency: Thin  Diet effective now       Question Answer Comment  Room service appropriate? Yes   Fluid consistency: Thin      06/20/23 2330           Family Communication:  ***  Consults:  ***  Admission status:   {Blank single:19197::"Observation","Inpatient"}, {Blank  single:19197::"Med-Surg","Telemetry bed","Step Down Unit"}  Severity of Illness: {Observation/Inpatient:21159}   Dolly Rias, MD [facility]  How to contact the [facility] Attending or Consulting provider 7A - 7P or covering provider during after hours 7P -7A, for this patient.  Check the care team in [facility] and look for a) attending/consulting [facility] provider listed and b) the [facility] team listed Log into [email or web address] and use 's universal password to access. If you do not have the password, please contact the hospital operator. Locate the [facility] provider you are looking for under [facility] and page to a number that you can be directly reached. If you still have difficulty reaching the provider, please page the [facility] (Director on Call) for the  Hospitalists listed on amion for assistance.  06/20/2023, 11:58 PM

## 2023-06-21 DIAGNOSIS — G9340 Encephalopathy, unspecified: Secondary | ICD-10-CM | POA: Diagnosis not present

## 2023-06-21 DIAGNOSIS — Z1152 Encounter for screening for COVID-19: Secondary | ICD-10-CM | POA: Diagnosis not present

## 2023-06-21 DIAGNOSIS — T43505A Adverse effect of unspecified antipsychotics and neuroleptics, initial encounter: Secondary | ICD-10-CM | POA: Diagnosis present

## 2023-06-21 DIAGNOSIS — F2089 Other schizophrenia: Secondary | ICD-10-CM | POA: Diagnosis not present

## 2023-06-21 DIAGNOSIS — R0902 Hypoxemia: Secondary | ICD-10-CM | POA: Diagnosis present

## 2023-06-21 DIAGNOSIS — E1140 Type 2 diabetes mellitus with diabetic neuropathy, unspecified: Secondary | ICD-10-CM | POA: Diagnosis present

## 2023-06-21 DIAGNOSIS — Z79899 Other long term (current) drug therapy: Secondary | ICD-10-CM | POA: Diagnosis not present

## 2023-06-21 DIAGNOSIS — R569 Unspecified convulsions: Secondary | ICD-10-CM | POA: Diagnosis not present

## 2023-06-21 DIAGNOSIS — F79 Unspecified intellectual disabilities: Secondary | ICD-10-CM | POA: Diagnosis present

## 2023-06-21 DIAGNOSIS — G9341 Metabolic encephalopathy: Secondary | ICD-10-CM | POA: Diagnosis present

## 2023-06-21 DIAGNOSIS — I10 Essential (primary) hypertension: Secondary | ICD-10-CM | POA: Diagnosis present

## 2023-06-21 DIAGNOSIS — F32A Depression, unspecified: Secondary | ICD-10-CM | POA: Diagnosis present

## 2023-06-21 DIAGNOSIS — R32 Unspecified urinary incontinence: Secondary | ICD-10-CM | POA: Diagnosis present

## 2023-06-21 DIAGNOSIS — R4182 Altered mental status, unspecified: Secondary | ICD-10-CM | POA: Diagnosis present

## 2023-06-21 DIAGNOSIS — G2119 Other drug induced secondary parkinsonism: Secondary | ICD-10-CM | POA: Diagnosis present

## 2023-06-21 DIAGNOSIS — K219 Gastro-esophageal reflux disease without esophagitis: Secondary | ICD-10-CM | POA: Diagnosis present

## 2023-06-21 DIAGNOSIS — J101 Influenza due to other identified influenza virus with other respiratory manifestations: Principal | ICD-10-CM | POA: Insufficient documentation

## 2023-06-21 DIAGNOSIS — G259 Extrapyramidal and movement disorder, unspecified: Secondary | ICD-10-CM | POA: Diagnosis not present

## 2023-06-21 DIAGNOSIS — E860 Dehydration: Secondary | ICD-10-CM | POA: Insufficient documentation

## 2023-06-21 DIAGNOSIS — E876 Hypokalemia: Secondary | ICD-10-CM | POA: Diagnosis present

## 2023-06-21 DIAGNOSIS — M549 Dorsalgia, unspecified: Secondary | ICD-10-CM | POA: Diagnosis present

## 2023-06-21 DIAGNOSIS — E039 Hypothyroidism, unspecified: Secondary | ICD-10-CM | POA: Diagnosis present

## 2023-06-21 DIAGNOSIS — F209 Schizophrenia, unspecified: Secondary | ICD-10-CM | POA: Diagnosis present

## 2023-06-21 DIAGNOSIS — E785 Hyperlipidemia, unspecified: Secondary | ICD-10-CM | POA: Diagnosis present

## 2023-06-21 DIAGNOSIS — Z791 Long term (current) use of non-steroidal anti-inflammatories (NSAID): Secondary | ICD-10-CM | POA: Diagnosis not present

## 2023-06-21 DIAGNOSIS — Z7984 Long term (current) use of oral hypoglycemic drugs: Secondary | ICD-10-CM | POA: Diagnosis not present

## 2023-06-21 LAB — RESPIRATORY PANEL BY PCR

## 2023-06-21 LAB — TSH: TSH: 3.483 u[IU]/mL (ref 0.350–4.500)

## 2023-06-21 LAB — GLUCOSE, CAPILLARY: Glucose-Capillary: 112 mg/dL — ABNORMAL HIGH (ref 70–99)

## 2023-06-21 MED ORDER — GABAPENTIN 300 MG PO CAPS
300.0000 mg | ORAL_CAPSULE | Freq: Three times a day (TID) | ORAL | Status: DC
  Administered 2023-06-21 – 2023-06-28 (×21): 300 mg via ORAL
  Filled 2023-06-21 (×21): qty 1

## 2023-06-21 MED ORDER — HYDRALAZINE HCL 20 MG/ML IJ SOLN: 10.0000 mg | Freq: Four times a day (QID) | INTRAMUSCULAR | Status: DC | PRN

## 2023-06-21 MED ORDER — LORAZEPAM 2 MG/ML IJ SOLN
2.0000 mg | INTRAMUSCULAR | Status: DC | PRN
  Administered 2023-06-21 – 2023-06-26 (×7): 2 mg via INTRAVENOUS
  Filled 2023-06-21 (×9): qty 1

## 2023-06-21 MED ORDER — DIPHENHYDRAMINE HCL 25 MG PO CAPS: 25.0000 mg | ORAL_CAPSULE | Freq: Three times a day (TID) | ORAL | Status: DC | PRN

## 2023-06-21 MED ORDER — LORAZEPAM 2 MG/ML IJ SOLN
2.0000 mg | INTRAMUSCULAR | Status: AC
  Administered 2023-06-21: 2 mg via INTRAVENOUS

## 2023-06-21 MED ORDER — DIPHENHYDRAMINE HCL 50 MG/ML IJ SOLN
50.0000 mg | INTRAMUSCULAR | Status: AC
  Administered 2023-06-22: 50 mg via INTRAVENOUS
  Filled 2023-06-21: qty 1

## 2023-06-21 MED ORDER — DIPHENHYDRAMINE HCL 25 MG PO CAPS
25.0000 mg | ORAL_CAPSULE | Freq: Three times a day (TID) | ORAL | Status: DC | PRN
  Administered 2023-06-22: 25 mg via ORAL
  Filled 2023-06-21 (×2): qty 1

## 2023-06-21 NOTE — TOC CM/SW Note (Addendum)
Spoke to patient's nephew, Onalee Hua, who stated patient's legal guardian is patient's brother, Hessie Diener, who is in jail at this time.

## 2023-06-21 NOTE — Evaluation (Signed)
Physical Therapy Evaluation Patient Details Name: Annette Martin MRN: [id number] DOB: [birth year] Today's Date: 06/21/2023  History of Present Illness  The pt is a 68 yo female presenting from ALF on 2/2 with AMS. Work up revealed: acute metabolic encephalopathy, influenza A, and dehydration. PMH includes: schizophrenia, DM II, HLD, HTN, hypothyroidism, and urinary incontinence.   Clinical Impression  Pt in bed upon arrival of PT, agreeable to evaluation at this time. Prior to admission the pt reports she primarily uses a WC, and initially reports staff uses a lift to get her to [facility], but later reports she completes a stand-pivot transfer. The pt is an unreliable historian at this time, and no family is present to confirm. Pt oriented to self only initially, but was able to remember month and year when asked again at end of session and reports location as "[facility]". The pt was dependent on assist from therapist to complete bed mobility, +2 assist to reposition in bed, and modA to CGA to maintain static sitting EOB. She needed max cues to follow commands, and max cues to maintain eyes open at this time. Will continue to benefit from skilled PT to progress activity tolerance, balance, and OOB transfers. At this time, recommend continued inpatient therapy <3hours/day but pt may be able to progress back to ALF with PT.      If plan is discharge home, recommend the following: Two people to help with walking and/or transfers;Two people to help with bathing/dressing/bathroom;Assistance with cooking/housework;Direct supervision/assist for medications management;Direct supervision/assist for financial management;Assist for transportation;Help with stairs or ramp for entrance;Supervision due to cognitive status   Can travel by private vehicle   No    Equipment Recommendations  (per pt, has WC and lift at ALF)  Recommendations for Other Services  OT consult    Functional Status Assessment Patient  has had a recent decline in their functional status and demonstrates the ability to make significant improvements in function in a reasonable and predictable amount of time.     Precautions / Restrictions Precautions Precautions: Fall Restrictions Weight Bearing Restrictions Per Provider Order: No      Mobility  Bed Mobility Overal bed mobility: Needs Assistance Bed Mobility: Rolling, Sit to Supine, Supine to Sit Rolling: Max assist, Total assist, Used rails   Supine to sit: Max assist, HOB elevated, Used rails Sit to supine: Total assist, +2 for safety/equipment, Used rails   General bed mobility comments: max-total due to poor contribution by pt, maintaining eyes closed despite cues    Transfers                   General transfer comment: deferred due to poor balance in sititng and pt witheyes closed       Balance Overall balance assessment: Needs assistance Sitting-balance support: Single extremity supported, Feet supported Sitting balance-Leahy Scale: Poor Sitting balance - Comments: initially modA, progressed to CGA. posterior lean and LOB Postural control: Posterior lean                                   Pertinent Vitals/Pain      Home Living Family/patient expects to be discharged to:: Assisted living                   Additional Comments: [facility], pt reports use of WC and initially use of lift to [facility], then later reports she stand for transfer. pt  reports staff assist with ADLs    Prior Function Prior Level of Function : History of Falls (last six months);Patient poor historian/Family not available       Physical Assist : Mobility (physical);ADLs (physical) Mobility (physical): Bed mobility;Transfers;Gait ADLs (physical): Grooming;Bathing;Dressing;Toileting;IADLs Mobility Comments: pt reports uses WC for "years" and initially said lift to [facility],  but later stated "people help me, I just stand up" ADLs Comments: pt reports  assist from staff     Extremity/Trunk Assessment   Upper Extremity Assessment Upper Extremity Assessment: Difficult to assess due to impaired cognition (pt with edema in BUE, is able to grip weakly, limited push/pull, able to complete PROM >90 at shoulders. poor funcitonal use in session.)    Lower Extremity Assessment Lower Extremity Assessment: Generalized weakness;Difficult to assess due to impaired cognition    Cervical / Trunk Assessment Cervical / Trunk Assessment: Kyphotic;Other exceptions Cervical / Trunk Exceptions: large body habitus  Communication   Communication Communication: No apparent difficulties Cueing Techniques: Verbal cues;Tactile cues;Gestural cues  Cognition Arousal: Lethargic, Obtunded Behavior During Therapy: Flat affect Overall Cognitive Status: No family/caregiver present to determine baseline cognitive functioning                                 General Comments: no one present to confirm baseline and pt admitted with AMS, inconsistent answers in session to PLOF, is oriented to self only initially, remembered month when asked again at end of session and, after being told she is at [facility], she reports "[place] [facility]" when asked at end of session        General Comments General comments (skin integrity, edema, etc.): VSS on RA, SpO2 91% and BP stable 120s/70s    Exercises General Exercises - Lower Extremity Long Arc Quad:  (partial ROM) Hip Flexion/Marching:  (partial ROM)   Assessment/Plan    PT Assessment Patient needs continued PT services  PT Problem List Decreased strength;Decreased range of motion;Decreased activity tolerance;Decreased balance;Decreased mobility;Decreased cognition;Decreased safety awareness       PT Treatment Interventions DME instruction;Gait training;Functional mobility training;Therapeutic activities;Therapeutic exercise;Balance training;Patient/family education    PT Goals (Current goals can be found in  the Care Plan section)  Acute Rehab PT Goals Patient Stated Goal: to ge up PT Goal Formulation: With patient Time For Goal Achievement: 07/05/23 Potential to Achieve Goals: Fair    Frequency Min 1X/week        AM-PAC PT "6 Clicks" Mobility  Outcome Measure Help needed turning from your back to your side while in a flat bed without using bedrails?: Total Help needed moving from lying on your back to sitting on the side of a flat bed without using bedrails?: Total Help needed moving to and from a bed to a chair (including a wheelchair)?: Total Help needed standing up from a chair using your arms (e.g., wheelchair or bedside chair)?: Total Help needed to walk in hospital room?: Total Help needed climbing 3-5 steps with a railing? : Total 6 Click Score: 6    End of Session   Activity Tolerance: Patient limited by lethargy Patient left: in bed;with call bell/phone within reach;with bed alarm set Nurse Communication: Mobility status;Need for lift equipment PT Visit Diagnosis: Unsteadiness on feet (R26.81);Muscle weakness (generalized) (M62.81)    Time: 1610-9604 PT Time Calculation (min) (ACUTE ONLY): 21 min   Charges:   PT Evaluation $PT Eval Moderate Complexity: 1 Mod   PT General Charges $$  ACUTE PT VISIT: 1 Visit         Vickki Muff, PT, DPT   Acute Rehabilitation Department Office [phone] Secure Chat Communication Preferred  Ronnie Derby 06/21/2023, 3:02 PM

## 2023-06-21 NOTE — Progress Notes (Signed)
PROGRESS NOTE    Annette Martin  XBJ:[id number] DOB: 21-Mar-1956 DOA: 06/20/2023 PCP: Galvin Proffer, MD   Brief Narrative:    Annette Martin is a 68 y.o. female with hx of schizophrenia, diabetes, hyperlipidemia, hypothyroidism, urinary incontinence, who was brought in from [facility] for altered mental status.  She was admitted for acute metabolic encephalopathy in the setting of influenza A infection as well as some dehydration.  Assessment & Plan:   Principal Problem:   Encephalopathy Active Problems:   Influenza A   Dehydration  Assessment and Plan:   Encephalopathy acute, metabolic Encephalopathic since 1/31.  Reportedly leading to one side per ALF.  Exam with prominent somnolence, no focal deficits noted.  Lab evaluation notable for positive flu A, findings of dehydration with borderline lactate, UA with ketone and hyaline cast. CT head negative for acute findings.  Suspect her encephalopathy is multifactorial related to her influenza A infection, dehydration, medication effects -Management of influenza A per below -Management of dehydration per below -Hold her evening dose of gabapentin and clonazepam; otherwise continue home psychotropic meds.   Influenza A without significant pneumonia - Tamiflu 75 mg twice daily for 5 days - Albuterol prn, incentive spirometer   Dehydration Deconditioning - S/p 1 L IV fluid in the ED.  Continue LR at 100 cc/h overnight then reassess -PT evaluation   Chronic medical problems: History of schizophrenia: Continue home Haldol, valproate, sertraline. Hold evening dose of Clonazepam ?  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  Glucose within goal range can add SSI if runs high. ? Neuropathy: Hold gabapentin for now  HLD: Continue home atorvastatin Hypothyroidism: TSH 3.4 Urinary incontinence: Continue home bethanechol 3 times daily and fesoteridine nightly GERD: home PPI    DVT  prophylaxis: SCDs Code Status: Full Family Communication: None at bedside, tried calling legal guardian 2/3 with no response Disposition Plan:  Status is: Observation The patient will require care spanning > 2 midnights and should be moved to inpatient because: Need for IV fluids and close monitoring.   Consultants:  None  Procedures:  None  Antimicrobials:  Anti-infectives (From admission, onward)    Start     Dose/Rate Route Frequency Ordered Stop   06/20/23 2345  oseltamivir (TAMIFLU) capsule 75 mg        75 mg Oral 2 times daily 06/20/23 2331 06/25/23 2159       Subjective: Patient seen and evaluated today and will yell out occasionally and cannot accurately state if anything is bothering her. No acute concerns or events noted overnight.  Objective: Vitals:   06/21/23 0235 06/21/23 0415 06/21/23 0545 06/21/23 0736  BP:  (!) 148/72 (!) 155/71 (!) 151/63  Pulse:  81 77 90  Resp:  16 16 17   Temp: (!) 97.4 F (36.3 C)  98.1 F (36.7 C) (!) 97.5 F (36.4 C)  TempSrc: Oral  Axillary Oral  SpO2:  95% 95% 94%    Intake/Output Summary (Last 24 hours) at 06/21/2023 1003 Last data filed at 06/21/2023 0014 Gross per 24 hour  Intake 1000 ml  Output --  Net 1000 ml   There were no vitals filed for this visit.  Examination:  General exam: Appears calm and comfortable  Respiratory system: Clear to auscultation. Respiratory effort normal. Cardiovascular system: S1 & S2 heard, RRR.  Gastrointestinal system: Abdomen is soft Central nervous system: Yelling out infrequently Extremities: No edema Skin: No significant lesions noted Psychiatry: Flat affect.    Data  Reviewed: I have personally reviewed following labs and imaging studies  CBC: Recent Labs  Lab 06/20/23 1959 06/20/23 2058  WBC 4.2  --   NEUTROABS 2.2  --   HGB 12.2 12.6  HCT 38.1 37.0  MCV 89.2  --   PLT 173  --    Basic Metabolic Panel: Recent Labs  Lab 06/20/23 1959 06/20/23 2058  NA 139 139   K 3.8 3.8  CL 97*  --   CO2 28  --   GLUCOSE 122*  --   BUN 19  --   CREATININE 1.04*  --   CALCIUM 8.9  --    GFR: Estimated Creatinine Clearance: 61.3 mL/min (A) (by C-G formula based on SCr of 1.04 mg/dL (H)). Liver Function Tests: Recent Labs  Lab 06/20/23 1959  AST 35  ALT 21  ALKPHOS 73  BILITOT 0.6  PROT 6.7  ALBUMIN 3.4*   No results for input(s): "LIPASE", "AMYLASE" in the last 168 hours. Recent Labs  Lab 06/20/23 1959  AMMONIA 20   Coagulation Profile: No results for input(s): "INR", "PROTIME" in the last 168 hours. Cardiac Enzymes: No results for input(s): "CKTOTAL", "CKMB", "CKMBINDEX", "TROPONINI" in the last 168 hours. BNP (last 3 results) No results for input(s): "PROBNP" in the last 8760 hours. HbA1C: No results for input(s): "HGBA1C" in the last 72 hours. CBG: No results for input(s): "GLUCAP" in the last 168 hours. Lipid Profile: No results for input(s): "CHOL", "HDL", "LDLCALC", "TRIG", "CHOLHDL", "LDLDIRECT" in the last 72 hours. Thyroid Function Tests: Recent Labs    06/20/23 1959  TSH 3.483   Anemia Panel: No results for input(s): "VITAMINB12", "FOLATE", "FERRITIN", "TIBC", "IRON", "RETICCTPCT" in the last 72 hours. Sepsis Labs: Recent Labs  Lab 06/20/23 2059  LATICACIDVEN 1.9    Recent Results (from the past 240 hours)  Resp panel by RT-PCR (RSV, Flu A&B, Covid) Anterior Nasal Swab     Status: Abnormal   Collection Time: 06/20/23  7:59 PM   Specimen: Anterior Nasal Swab  Result Value Ref Range Status   SARS Coronavirus 2 by RT PCR NEGATIVE NEGATIVE Final   Influenza A by PCR POSITIVE (A) NEGATIVE Final   Influenza B by PCR NEGATIVE NEGATIVE Final    Comment: (NOTE) The Xpert Xpress SARS-CoV-2/FLU/RSV plus assay is intended as an aid in the diagnosis of influenza from Nasopharyngeal swab specimens and should not be used as a sole basis for treatment. Nasal washings and aspirates are unacceptable for Xpert Xpress  SARS-CoV-2/FLU/RSV testing.  Fact Sheet for Patients: BloggerCourse.com  Fact Sheet for Healthcare Providers: SeriousBroker.it  This test is not yet approved or cleared by the [place] FDA and has been authorized for detection and/or diagnosis of SARS-CoV-2 by FDA under an Emergency Use Authorization (EUA). This EUA will remain in effect (meaning this test can be used) for the duration of the COVID-19 declaration under Section 564(b)(1) of the Act, 21 U.S.C. section 360bbb-3(b)(1), unless the authorization is terminated or revoked.     Resp Syncytial Virus by PCR NEGATIVE NEGATIVE Final    Comment: (NOTE) Fact Sheet for Patients: BloggerCourse.com  Fact Sheet for Healthcare Providers: SeriousBroker.it  This test is not yet approved or cleared by the [place] FDA and has been authorized for detection and/or diagnosis of SARS-CoV-2 by FDA under an Emergency Use Authorization (EUA). This EUA will remain in effect (meaning this test can be used) for the duration of the COVID-19 declaration under Section 564(b)(1) of the Act, 21 U.S.C. section  360bbb-3(b)(1), unless the authorization is terminated or revoked.  Performed at [facility], [address] [place], [place] [address]          Radiology Studies: DG Chest Port 1 View Result Date: 06/20/2023 CLINICAL DATA:  Altered mental status. EXAM: PORTABLE CHEST 1 VIEW COMPARISON:  02/14/2023 FINDINGS: Shallow inspiration. Heart size and pulmonary vascularity are normal. Lungs are clear. No pleural effusions. No pneumothorax. Mediastinal contours appear intact. Degenerative changes in the spine and shoulders. Surgical clips in the right upper quadrant. IMPRESSION: Shallow inspiration.  No evidence of active pulmonary disease. Electronically Signed   By: Burman Nieves M.D.   On: 06/20/2023 20:28   CT Head Wo  Contrast Result Date: 06/20/2023 CLINICAL DATA:  Mental status change EXAM: CT HEAD WITHOUT CONTRAST TECHNIQUE: Contiguous axial images were obtained from the base of the skull through the vertex without intravenous contrast. RADIATION DOSE REDUCTION: This exam was performed according to the departmental dose-optimization program which includes automated exposure control, adjustment of the mA and/or kV according to patient size and/or use of iterative reconstruction technique. COMPARISON:  CT 06/12/2023, MRI 06/12/2023, 10/01/2022, 04/21/2023 FINDINGS: Brain: No acute territorial infarction, hemorrhage, or intracranial mass. Mild atrophy. Stable ventriculomegaly. Mild chronic small vessel ischemic changes of the white matter Vascular: No hyperdense vessel or unexpected calcification. Skull: Normal. Negative for fracture or focal lesion. Sinuses/Orbits: No acute finding. Other: None IMPRESSION: 1. No CT evidence for acute intracranial abnormality. 2. Mild atrophy and chronic small vessel ischemic changes of the white matter. Stable ventriculomegaly. Electronically Signed   By: Jasmine Pang M.D.   On: 06/20/2023 19:52        Scheduled Meds:  amantadine  100 mg Oral BID   atorvastatin  10 mg Oral QHS   bethanechol  25 mg Oral TID   divalproex  250 mg Oral TID   docusate sodium  100 mg Oral BID   ferrous sulfate  325 mg Oral Q breakfast   fesoterodine  4 mg Oral QHS   haloperidol  5 mg Oral TID   oseltamivir  75 mg Oral BID   pantoprazole  40 mg Oral BID   sertraline  200 mg Oral QHS   Continuous Infusions:  lactated ringers 100 mL/hr at 06/21/23 0036     LOS: 0 days    Time spent: 55 minutes    Annette Lwin Hoover Brunette, DO [facility]  If 7PM-7AM, please contact night-coverage [email or web address] 06/21/2023, 10:03 AM

## 2023-06-21 NOTE — ED Notes (Signed)
ED TO INPATIENT HANDOFF REPORT  ED Nurse Name and Phone #: Hansel Starling [phone]  S Name/Age/Gender Annette Martin 68 y.o. female Room/Bed: 037C/037C  Code Status   Code Status: Full Code  Home/SNF/Other Skilled nursing facility Patient oriented to: self Is this baseline? Yes   Triage Complete: Triage complete  Chief Complaint Encephalopathy [G93.40]  Triage Note Pt BIB [facility] from [facility] due to altered mental status.  Facility reports leaning started yesterday.  LKW 06/18/23. VSS.   Allergies No Known Allergies  Level of Care/Admitting Diagnosis ED Disposition     ED Disposition  Admit   Condition  --   Comment  Hospital Area: [facility] [[id number]]  Level of Care: Med-Surg [16]  May place patient in observation at [facility] or [facility] if equivalent level of care is available:: No  Covid Evaluation: Asymptomatic - no recent exposure (last 10 days) testing not required  Diagnosis: Encephalopathy [291958]  Admitting Physician: Dolly Rias [[id number]]  Attending Physician: Dolly Rias [[id number]]          B Medical/Surgery History Past Medical History:  Diagnosis Date   Depression    Diabetes mellitus    type 2   GERD (gastroesophageal reflux disease)    Hypertension    Hypoxemia    Mental retardation    Neuroleptic malignant syndrome    Rhabdomyolysis    Schizophrenia (HCC)    Thyroid disease    Vitamin D deficient osteomalacia    Past Surgical History:  Procedure Laterality Date   COLONOSCOPY  2010   MANN     A IV Location/Drains/Wounds Patient Lines/Drains/Airways Status     Active Line/Drains/Airways     Name Placement date Placement time Site Days   Peripheral IV 06/20/23 20 G Right Antecubital 06/20/23  2110  Antecubital  1            Intake/Output Last 24 hours  Intake/Output Summary (Last 24 hours) at 06/21/2023 0552 Last data filed at 06/21/2023 0014 Gross per 24 hour   Intake 1000 ml  Output --  Net 1000 ml    Labs/Imaging Results for orders placed or performed during the hospital encounter of 06/20/23 (from the past 48 hours)  CBC with Differential/Platelet     Status: None   Collection Time: 06/20/23  7:59 PM  Result Value Ref Range   WBC 4.2 4.0 - 10.5 K/uL   RBC 4.27 3.87 - 5.11 MIL/uL   Hemoglobin 12.2 12.0 - 15.0 g/dL   HCT 95.6 21.3 - 08.6 %   MCV 89.2 80.0 - 100.0 fL   MCH 28.6 26.0 - 34.0 pg   MCHC 32.0 30.0 - 36.0 g/dL   RDW 57.8 46.9 - 62.9 %   Platelets 173 150 - 400 K/uL   nRBC 0.0 0.0 - 0.2 %   Neutrophils Relative % 53 %   Neutro Abs 2.2 1.7 - 7.7 K/uL   Lymphocytes Relative 25 %   Lymphs Abs 1.1 0.7 - 4.0 K/uL   Monocytes Relative 20 %   Monocytes Absolute 0.8 0.1 - 1.0 K/uL   Eosinophils Relative 1 %   Eosinophils Absolute 0.0 0.0 - 0.5 K/uL   Basophils Relative 0 %   Basophils Absolute 0.0 0.0 - 0.1 K/uL   Immature Granulocytes 1 %   Abs Immature Granulocytes 0.03 0.00 - 0.07 K/uL    Comment: Performed at [facility], [address], [place], [place] [address]  Comprehensive metabolic panel  Status: Abnormal   Collection Time: 06/20/23  7:59 PM  Result Value Ref Range   Sodium 139 135 - 145 mmol/L   Potassium 3.8 3.5 - 5.1 mmol/L   Chloride 97 (L) 98 - 111 mmol/L   CO2 28 22 - 32 mmol/L   Glucose, Bld 122 (H) 70 - 99 mg/dL    Comment: Glucose reference range applies only to samples taken after fasting for at least 8 hours.   BUN 19 8 - 23 mg/dL   Creatinine, Ser 5.28 (H) 0.44 - 1.00 mg/dL   Calcium 8.9 8.9 - 41.3 mg/dL   Total Protein 6.7 6.5 - 8.1 g/dL   Albumin 3.4 (L) 3.5 - 5.0 g/dL   AST 35 15 - 41 U/L   ALT 21 0 - 44 U/L   Alkaline Phosphatase 73 38 - 126 U/L   Total Bilirubin 0.6 0.0 - 1.2 mg/dL   GFR, Estimated 60 (L) >60 mL/min    Comment: (NOTE) Calculated using the CKD-EPI Creatinine Equation (2021)    Anion gap 14 5 - 15    Comment: Performed at [facility], [address] [place], [place] [address]  Valproic acid level     Status: None   Collection Time: 06/20/23  7:59 PM  Result Value Ref Range   Valproic Acid Lvl 70 50.0 - 100.0 ug/mL    Comment: Performed at [facility], [address], [place], [place] [address]  Ammonia     Status: None   Collection Time: 06/20/23  7:59 PM  Result Value Ref Range   Ammonia 20 9 - 35 umol/L    Comment: HEMOLYSIS AT THIS LEVEL MAY AFFECT RESULT Performed at [facility], [address], [place], [place] [address]   Resp panel by RT-PCR (RSV, Flu A&B, Covid) Anterior Nasal Swab     Status: Abnormal   Collection Time: 06/20/23  7:59 PM   Specimen: Anterior Nasal Swab  Result Value Ref Range   SARS Coronavirus 2 by RT PCR NEGATIVE NEGATIVE   Influenza A by PCR POSITIVE (A) NEGATIVE   Influenza B by PCR NEGATIVE NEGATIVE    Comment: (NOTE) The Xpert Xpress SARS-CoV-2/FLU/RSV plus assay is intended as an aid in the diagnosis of influenza from Nasopharyngeal swab specimens and should not be used as a sole basis for treatment. Nasal washings and aspirates are unacceptable for Xpert Xpress SARS-CoV-2/FLU/RSV testing.  Fact Sheet for Patients: BloggerCourse.com  Fact Sheet for Healthcare Providers: SeriousBroker.it  This test is not yet approved or cleared by the [place] FDA and has been authorized for detection and/or diagnosis of SARS-CoV-2 by FDA under an Emergency Use Authorization (EUA). This EUA will remain in effect (meaning this test can be used) for the duration of the COVID-19 declaration under Section 564(b)(1) of the Act, 21 U.S.C. section 360bbb-3(b)(1), unless the authorization is terminated or revoked.     Resp Syncytial Virus by PCR NEGATIVE NEGATIVE    Comment: (NOTE) Fact Sheet for Patients: BloggerCourse.com  Fact Sheet for Healthcare Providers: SeriousBroker.it  This test  is not yet approved or cleared by the [place] FDA and has been authorized for detection and/or diagnosis of SARS-CoV-2 by FDA under an Emergency Use Authorization (EUA). This EUA will remain in effect (meaning this test can be used) for the duration of the COVID-19 declaration under Section 564(b)(1) of the Act, 21 U.S.C. section 360bbb-3(b)(1), unless the authorization is terminated or revoked.  Performed at [facility], [address], [place], [place] [address]   I-Stat venous blood gas, ED     Status: Abnormal   Collection Time: 06/20/23  8:58 PM  Result Value Ref Range   pH, Ven 7.432 (H) 7.25 - 7.43   pCO2, Ven 51.8 44 - 60 mmHg   pO2, Ven 28 (LL) 32 - 45 mmHg   Bicarbonate 34.6 (H) 20.0 - 28.0 mmol/L   TCO2 36 (H) 22 - 32 mmol/L   O2 Saturation 52 %   Acid-Base Excess 9.0 (H) 0.0 - 2.0 mmol/L   Sodium 139 135 - 145 mmol/L   Potassium 3.8 3.5 - 5.1 mmol/L   Calcium, Ion 1.09 (L) 1.15 - 1.40 mmol/L   HCT 37.0 36.0 - 46.0 %   Hemoglobin 12.6 12.0 - 15.0 g/dL   Sample type VENOUS    Comment NOTIFIED PHYSICIAN   I-Stat CG4 Lactic Acid     Status: None   Collection Time: 06/20/23  8:59 PM  Result Value Ref Range   Lactic Acid, Venous 1.9 0.5 - 1.9 mmol/L  Urinalysis, w/ Reflex to Culture (Infection Suspected) -Urine, Clean Catch     Status: Abnormal   Collection Time: 06/20/23  9:08 PM  Result Value Ref Range   Specimen Source URINE, CLEAN CATCH    Color, Urine AMBER (A) YELLOW    Comment: BIOCHEMICALS MAY BE AFFECTED BY COLOR   APPearance HAZY (A) CLEAR   Specific Gravity, Urine 1.035 (H) 1.005 - 1.030   pH 5.0 5.0 - 8.0   Glucose, UA NEGATIVE NEGATIVE mg/dL   Hgb urine dipstick NEGATIVE NEGATIVE   Bilirubin Urine SMALL (A) NEGATIVE   Ketones, ur 5 (A) NEGATIVE mg/dL   Protein, ur 782 (A) NEGATIVE mg/dL   Nitrite NEGATIVE NEGATIVE   Leukocytes,Ua NEGATIVE NEGATIVE   RBC / HPF 0-5 0 - 5 RBC/hpf   WBC, UA 0-5 0 - 5 WBC/hpf    Comment:        Reflex  urine culture not performed if WBC <=10, OR if Squamous epithelial cells >5. If Squamous epithelial cells >5 suggest recollection.    Bacteria, UA NONE SEEN NONE SEEN   Squamous Epithelial / HPF 0-5 0 - 5 /HPF   Mucus PRESENT    Hyaline Casts, UA PRESENT     Comment: Performed at [facility], [address], [place], [place] [address]   DG Chest Port 1 View Result Date: 06/20/2023 CLINICAL DATA:  Altered mental status. EXAM: PORTABLE CHEST 1 VIEW COMPARISON:  02/14/2023 FINDINGS: Shallow inspiration. Heart size and pulmonary vascularity are normal. Lungs are clear. No pleural effusions. No pneumothorax. Mediastinal contours appear intact. Degenerative changes in the spine and shoulders. Surgical clips in the right upper quadrant. IMPRESSION: Shallow inspiration.  No evidence of active pulmonary disease. Electronically Signed   By: Burman Nieves M.D.   On: 06/20/2023 20:28   CT Head Wo Contrast Result Date: 06/20/2023 CLINICAL DATA:  Mental status change EXAM: CT HEAD WITHOUT CONTRAST TECHNIQUE: Contiguous axial images were obtained from the base of the skull through the vertex without intravenous contrast. RADIATION DOSE REDUCTION: This exam was performed according to the departmental dose-optimization program which includes automated exposure control, adjustment of the mA and/or kV according to patient size and/or use of iterative reconstruction technique. COMPARISON:  CT 06/12/2023, MRI 06/12/2023, 10/01/2022, 04/21/2023 FINDINGS: Brain: No acute territorial infarction, hemorrhage, or intracranial mass. Mild atrophy. Stable ventriculomegaly. Mild chronic small vessel ischemic changes of the white matter Vascular:  No hyperdense vessel or unexpected calcification. Skull: Normal. Negative for fracture or focal lesion. Sinuses/Orbits: No acute finding. Other: None IMPRESSION: 1. No CT evidence for acute intracranial abnormality. 2. Mild atrophy and chronic small vessel ischemic changes of the  white matter. Stable ventriculomegaly. Electronically Signed   By: Jasmine Pang M.D.   On: 06/20/2023 19:52    Pending Labs Unresulted Labs (From admission, onward)     Start     Ordered   06/21/23 0012  TSH  Add-on,   AD        06/21/23 0011            Vitals/Pain Today's Vitals   06/21/23 0055 06/21/23 0215 06/21/23 0235 06/21/23 0415  BP:  (!) 153/73  (!) 148/72  Pulse:  80  81  Resp:  20  16  Temp: 98.1 F (36.7 C)  (!) 97.4 F (36.3 C)   TempSrc: Axillary  Oral   SpO2:  97%  95%  PainSc:  Asleep  Asleep    Isolation Precautions No active isolations  Medications Medications  lactated ringers infusion ( Intravenous Rate/Dose Change 06/21/23 0036)  acetaminophen (TYLENOL) tablet 1,000 mg (has no administration in time range)  ondansetron (ZOFRAN) injection 4 mg (has no administration in time range)  melatonin tablet 6 mg (has no administration in time range)  albuterol (PROVENTIL) (2.5 MG/3ML) 0.083% nebulizer solution 2.5 mg (has no administration in time range)  oseltamivir (TAMIFLU) capsule 75 mg (75 mg Oral Given 06/21/23 0036)  amantadine (SYMMETREL) capsule 100 mg (has no administration in time range)  atorvastatin (LIPITOR) tablet 10 mg (has no administration in time range)  bethanechol (URECHOLINE) tablet 25 mg (has no administration in time range)  clonazePAM (KLONOPIN) tablet 0.5 mg (has no administration in time range)  divalproex (DEPAKOTE) DR tablet 250 mg (has no administration in time range)  docusate sodium (COLACE) capsule 100 mg (has no administration in time range)  ferrous sulfate tablet 325 mg (has no administration in time range)  fesoterodine (TOVIAZ) tablet 4 mg (has no administration in time range)  haloperidol (HALDOL) tablet 5 mg (has no administration in time range)  lactulose (CHRONULAC) 10 GM/15ML solution 10 g (has no administration in time range)  pantoprazole (PROTONIX) EC tablet 40 mg (has no administration in time range)   sertraline (ZOLOFT) tablet 200 mg (has no administration in time range)  lactated ringers bolus 1,000 mL (0 mLs Intravenous Stopped 06/21/23 0014)    Mobility power wheelchair     Focused Assessments Neuro Assessment Handoff:  Swallow screen pass? Yes          Neuro Assessment:   Neuro Checks:      Has TPA been given? No If patient is a Neuro Trauma and patient is going to OR before floor call report to 4N Charge nurse: [phone] or [phone]   R Recommendations: See Admitting Provider Note  Report given to:   Additional Notes:

## 2023-06-21 NOTE — TOC Initial Note (Signed)
Transition of Care [facility]) - Initial/Assessment Note    Patient Details  Name: Annette Martin MRN: [id number] Date of Birth: [birth year]  Transition of Care [facility]) CM/SW Contact:    Marliss Coots, LCSW Phone Number: 06/21/2023, 9:10 AM  Clinical Narrative:                  9:10 AM Per chart review, patient is from [facility] and has legal guardian Shardai Star; Spouse). PT/OT has been consulted. Per bedside RN, patient is not oriented enough to discuss SDOH and discharge plans.     Barriers to Discharge: Continued Medical Work up   Patient Goals and CMS Choice            Expected Discharge Plan and Services       Living arrangements for the past 2 months: Assisted Living Facility                                      Prior Living Arrangements/Services Living arrangements for the past 2 months: Assisted Living Facility Lives with:: Facility Resident Patient language and need for interpreter reviewed:: Yes        Need for Family Participation in Patient Care: Yes (Comment) Care giver support system in place?: Yes (comment)   Criminal Activity/Legal Involvement Pertinent to Current Situation/Hospitalization: No - Comment as needed  Activities of Daily Living      Permission Sought/Granted Permission sought to share information with : Family Supports Permission granted to share information with : No (Contact information on chart; Legal Guardian)  Share Information with NAME: Keshanna Riso     Permission granted to share info w Relationship: Spouse/Legal Guardian  Permission granted to share info w Contact Information: [phone]  Emotional Assessment   Attitude/Demeanor/Rapport: Unable to Assess Affect (typically observed): Unable to Assess   Alcohol / Substance Use: Not Applicable Psych Involvement: No (comment)  Admission diagnosis:  Dehydration [E86.0] Somnolence [R40.0] Influenza A [J10.1] Encephalopathy  [G93.40] Patient Active Problem List   Diagnosis Date Noted   Influenza A 06/21/2023   Dehydration 06/21/2023   Encephalopathy 06/20/2023   Tremor 02/19/2023   Hypertension 02/16/2023   Anemia 02/15/2023   Acute left-sided weakness 02/14/2023   Schizophrenia (HCC) 04/05/2018   Intellectual disability 12/05/2012   Diabetes mellitus (HCC) 05/28/2011   Hypothyroidism 05/28/2011   Hyperlipidemia 05/28/2011   PCP:  Galvin Proffer, MD Pharmacy:   [facility] - [place], [place] - [address] [place] [address] Phone: [phone] Fax: [phone]     Social Drivers of Health (SDOH) Social History: SDOH Screenings   Food Insecurity: Food Insecurity Present (02/15/2023)  Housing: Low Risk  (02/15/2023)  Transportation Needs: No Transportation Needs (02/15/2023)  Utilities: Not At Risk (02/15/2023)  Alcohol Screen: Low Risk  (04/05/2018)  Tobacco Use: Low Risk  (06/12/2023)   SDOH Interventions:     Readmission Risk Interventions     No data to display

## 2023-06-21 NOTE — Care Management Obs Status (Addendum)
MEDICARE OBSERVATION STATUS NOTIFICATION   Patient Details  Name: Annette Martin MRN: [id number] Date of Birth: 1956/05/14   Medicare Observation Status Notification Given:  Yes  Spoke to nephew, Armella Stogner, who states the brother and legal guardian , Margaretta Chittum, is in jail. Onalee Hua is the closest relative   Harriet Masson, RN 06/21/2023, 1:20 PM

## 2023-06-21 NOTE — Progress Notes (Signed)
SLP Cancellation Note  Patient Details Name: Annette Martin MRN: [id number] DOB: February 14, 1956   Cancelled treatment:       Reason Eval/Treat Not Completed: Fatigue/lethargy limiting ability to participate. Pt recently received haldol and ativan and did not rouse adequately for PO trials. SLP will f/u as able to complete a swallowing evaluation.   Gwynneth Aliment, M.A., CF-SLP Speech Language Pathology, Acute Rehabilitation Services  Secure Chat preferred [phone]  06/21/2023, 4:49 PM

## 2023-06-21 NOTE — TOC Progression Note (Signed)
Transition of Care [facility]) - Progression Note    Patient Details  Name: Annette Martin MRN: [id number] Date of Birth: Jul 20, 1955  Transition of Care [facility]) CM/SW Contact  Marliss Coots, LCSW Phone Number: 06/21/2023, 2:39 PM  Clinical Narrative:     2:39 PM CSW attempted to call [facility] DSS APS Supervisors (Melody and Bethann Berkshire) to follow up on guardianship. There were no responses and voicemails were left.    Barriers to Discharge: Continued Medical Work up  Expected Discharge Plan and Services       Living arrangements for the past 2 months: Assisted Living Facility                                       Social Determinants of Health (SDOH) Interventions SDOH Screenings   Food Insecurity: Food Insecurity Present (02/15/2023)  Housing: Low Risk  (02/15/2023)  Transportation Needs: No Transportation Needs (02/15/2023)  Utilities: Not At Risk (02/15/2023)  Alcohol Screen: Low Risk  (04/05/2018)  Tobacco Use: Low Risk  (06/12/2023)    Readmission Risk Interventions     No data to display

## 2023-06-21 NOTE — Progress Notes (Addendum)
Schizophrenia History of bradykinesia, cogwheel rigidity and resting tremor. Received a page from patient nurse that patient is having bilateral arm shakiness and bilateral knee joint shakiness.  At the bedside when I evaluated patient and distracted her heart shakiness completely resolved afterward patient started shaking again, crying and saying that I am in pain.  She is complaining about nonspecific pain. Has history of schizophrenia and currently patient is on Haldol, Depakote and Zoloft.  Differential diagnosis of tremor is to pseudoseizure versus side effect of antipsychotic medication associated EPS.  Spoke with neurology DR. Aroar with the concern for EPS.  Recommended to obtain an EEG and patient has been neurology inpatient multiple times for similar complaint include bradykinesia, resting tremor cogwheel rigidity and postural instability.    Dr. Wilford Corner recommended to speak with psychiatry for readjustment of antipsychotic medications. -Will obtain EEG to rule out any seizure. - Holding Haldol as it can cause worsening of extrapyramidal syndrome.  Continue Ativan and Benadryl as needed. -Continue Zoloft and Depakote. -Consulting psychiatry for adjustment of antipsychotic medications.  Tereasa Coop, MD [facility] 06/22/2023, 12:02 AM

## 2023-06-22 ENCOUNTER — Inpatient Hospital Stay (HOSPITAL_COMMUNITY): Payer: Medicare PPO

## 2023-06-22 DIAGNOSIS — R569 Unspecified convulsions: Secondary | ICD-10-CM | POA: Diagnosis not present

## 2023-06-22 DIAGNOSIS — G9340 Encephalopathy, unspecified: Secondary | ICD-10-CM | POA: Diagnosis not present

## 2023-06-22 DIAGNOSIS — G2119 Other drug induced secondary parkinsonism: Secondary | ICD-10-CM | POA: Diagnosis not present

## 2023-06-22 DIAGNOSIS — F79 Unspecified intellectual disabilities: Secondary | ICD-10-CM | POA: Diagnosis not present

## 2023-06-22 DIAGNOSIS — F2089 Other schizophrenia: Secondary | ICD-10-CM | POA: Diagnosis not present

## 2023-06-22 DIAGNOSIS — T43505A Adverse effect of unspecified antipsychotics and neuroleptics, initial encounter: Secondary | ICD-10-CM | POA: Diagnosis not present

## 2023-06-22 LAB — BASIC METABOLIC PANEL
Anion gap: 13 (ref 5–15)
BUN: 11 mg/dL (ref 8–23)
CO2: 28 mmol/L (ref 22–32)
Calcium: 8.4 mg/dL — ABNORMAL LOW (ref 8.9–10.3)
Chloride: 99 mmol/L (ref 98–111)
Creatinine, Ser: 0.75 mg/dL (ref 0.44–1.00)
GFR, Estimated: >60 mL/min (ref >60)
Glucose, Bld: 108 mg/dL — ABNORMAL HIGH (ref 70–99)
Potassium: 3.3 mmol/L — ABNORMAL LOW (ref 3.5–5.1)
Sodium: 140 mmol/L (ref 135–145)

## 2023-06-22 LAB — GLUCOSE, CAPILLARY: Glucose-Capillary: 168 mg/dL — ABNORMAL HIGH (ref 70–99)

## 2023-06-22 LAB — CK: Total CK: 394 U/L — ABNORMAL HIGH (ref 38–234)

## 2023-06-22 LAB — MAGNESIUM: Magnesium: 1.5 mg/dL — ABNORMAL LOW (ref 1.7–2.4)

## 2023-06-22 MED ORDER — INSULIN ASPART 100 UNIT/ML IJ SOLN
0.0000 [IU] | Freq: Three times a day (TID) | INTRAMUSCULAR | Status: DC
  Administered 2023-06-23 – 2023-06-27 (×3): 1 [IU] via SUBCUTANEOUS
  Administered 2023-06-28: 4 [IU] via SUBCUTANEOUS

## 2023-06-22 MED ORDER — QUETIAPINE FUMARATE ER 50 MG PO TB24
50.0000 mg | ORAL_TABLET | Freq: Every day | ORAL | Status: DC
  Administered 2023-06-22: 50 mg via ORAL
  Filled 2023-06-22: qty 1

## 2023-06-22 MED ORDER — SODIUM CHLORIDE 0.45 % IV BOLUS
1000.0000 mL | INTRAVENOUS | Status: AC
  Administered 2023-06-22: 1000 mL via INTRAVENOUS

## 2023-06-22 MED ORDER — POTASSIUM CHLORIDE CRYS ER 20 MEQ PO TBCR
40.0000 meq | EXTENDED_RELEASE_TABLET | Freq: Once | ORAL | Status: AC
  Administered 2023-06-22: 40 meq via ORAL
  Filled 2023-06-22: qty 2

## 2023-06-22 MED ORDER — ALBUTEROL SULFATE (2.5 MG/3ML) 0.083% IN NEBU
2.5000 mg | INHALATION_SOLUTION | RESPIRATORY_TRACT | Status: DC | PRN
  Administered 2023-06-22: 2.5 mg via RESPIRATORY_TRACT
  Filled 2023-06-22: qty 3

## 2023-06-22 MED ORDER — INSULIN ASPART 100 UNIT/ML IJ SOLN
0.0000 [IU] | Freq: Every day | INTRAMUSCULAR | Status: DC
  Administered 2023-06-27: 2 [IU] via SUBCUTANEOUS

## 2023-06-22 MED ORDER — MAGNESIUM SULFATE 2 GM/50ML IV SOLN
2.0000 g | Freq: Once | INTRAVENOUS | Status: AC
  Administered 2023-06-22: 2 g via INTRAVENOUS
  Filled 2023-06-22: qty 50

## 2023-06-22 NOTE — TOC Progression Note (Addendum)
 Transition of Care [facility]) - Progression Note    Patient Details  Name: Annette Martin MRN: [id number] Date of Birth: 05-30-1955  Transition of Care [facility]) CM/SW Contact  Lauraine FORBES Saa, LCSW Phone Number: 06/22/2023, 9:18 AM  Clinical Narrative:     9:18 AM CSW called patient's nephew, Alm, who stated that he is able to assist with patient's discharge as able to. Patient is from [facility] [place]. Per ALF, patient's brother/legal guardian, emailed 01/28 from [email or web address].  1:57 PM CSW emailed Dale Jules requesting a return call.  3:54 PM CSW attempted to call Alm [phone]) but there was no response and a voicemail was left.    Barriers to Discharge: Continued Medical Work up  Expected Discharge Plan and Services       Living arrangements for the past 2 months: Assisted Living Facility                                       Social Determinants of Health (SDOH) Interventions SDOH Screenings   Food Insecurity: Food Insecurity Present (02/15/2023)  Housing: Low Risk  (02/15/2023)  Transportation Needs: No Transportation Needs (02/15/2023)  Utilities: Not At Risk (02/15/2023)  Alcohol Screen: Low Risk  (04/05/2018)  Tobacco Use: Low Risk  (06/12/2023)    Readmission Risk Interventions     No data to display

## 2023-06-22 NOTE — Progress Notes (Signed)
 EEG complete - results pending

## 2023-06-22 NOTE — Procedures (Signed)
 Patient Name: Annette Martin  MRN: [id number]  Epilepsy Attending: Arlin MALVA Krebs  Referring Physician/Provider: Sundil, Subrina, MD  Date: 06/22/2023 Duration: 22.48 mins  Patient history: 68 year old female with bilateral arm shakiness and bilateral knee joint shakiness.  EEG to alert for seizure.  Level of alertness: Awake  AEDs during EEG study: Depakote   Technical aspects: This EEG study was done with scalp electrodes positioned according to the 10-20 International system of electrode placement. Electrical activity was reviewed with band pass filter of 1-70Hz , sensitivity of 7 uV/mm, display speed of 63mm/sec with a 60Hz  notched filter applied as appropriate. EEG data were recorded continuously and digitally stored.  Video monitoring was available and reviewed as appropriate.  Description: The posterior dominant rhythm consists of 8 Hz activity of moderate voltage (25-35 uV) seen predominantly in posterior head regions, symmetric and reactive to eye opening and eye closing. Hyperventilation and photic stimulation were not performed.     IMPRESSION: This study is within normal limits. No seizures or epileptiform discharges were seen throughout the recording.  A normal interictal EEG does not exclude the diagnosis of epilepsy.  Sutton Hirsch O Taahir Grisby

## 2023-06-22 NOTE — Evaluation (Signed)
 Occupational Therapy Evaluation Patient Details Name: Annette Martin MRN: [id number] DOB: 1955/09/02 Today's Date: 06/22/2023   History of Present Illness The pt is a 68 yo female presenting from ALF on 2/2 with AMS. Work up revealed: acute metabolic encephalopathy, influenza A, and dehydration. PMH includes: schizophrenia, DM II, HLD, HTN, hypothyroidism, and urinary incontinence.   Clinical Impression   Pt c/o back pain at rest. Pt able to answer some questions in consistently about PLOF and history. Pt A/Ox2 to self and location. Pt states she lives at ALF, receives help for all ADLs, transfers, cooking/cleaning, states she is w/c bound. Pt currently requires significant assistance for all ADLs, max-total for feeding, dressing, bathing, was unable to stand today during session, lethargic, posterior lean, significant assistance to assist to EOB. Pt would benefit from postacute rehab <3hrs/day to improve functional independence, will continue to follow acutely to progress as able.       If plan is discharge home, recommend the following: Two people to help with walking and/or transfers;A lot of help with bathing/dressing/bathroom;Assistance with cooking/housework;Assist for transportation;Help with stairs or ramp for entrance    Functional Status Assessment  Patient has had a recent decline in their functional status and demonstrates the ability to make significant improvements in function in a reasonable and predictable amount of time.  Equipment Recommendations  None recommended by OT    Recommendations for Other Services       Precautions / Restrictions Precautions Precautions: Fall Restrictions Weight Bearing Restrictions Per Provider Order: No      Mobility Bed Mobility Overal bed mobility: Needs Assistance Bed Mobility: Supine to Sit, Sit to Supine     Supine to sit: Max assist, Total assist Sit to supine: Total assist, +2 for physical assistance   General bed  mobility comments: Pt requires max-total A for bed mobility, poor trunk strength, posterior lean    Transfers Overall transfer level: Needs assistance                 General transfer comment: not attempted      Balance Overall balance assessment: Needs assistance Sitting-balance support: Single extremity supported, Feet supported Sitting balance-Leahy Scale: Poor Sitting balance - Comments: mod A to manage sitting EOB with posterior lean Postural control: Posterior lean                                 ADL either performed or assessed with clinical judgement   ADL Overall ADL's : Needs assistance/impaired Eating/Feeding: Maximal assistance;Bed level   Grooming: Maximal assistance;Bed level   Upper Body Bathing: Maximal assistance;Bed level   Lower Body Bathing: Total assistance;Bed level   Upper Body Dressing : Maximal assistance;Bed level   Lower Body Dressing: Total assistance;Bed level                 General ADL Comments: Pt bed level for most ADLs, was not able to sit on EOB without max-total A scooting to EOB. Did not attempt stand/transfer. Pt not able to feed self, barely able to hold a cup and use straw to drink without max A     Vision Baseline Vision/History: 1 Wears glasses       Perception         Praxis         Pertinent Vitals/Pain Pain Assessment Pain Assessment: 0-10 Pain Score: 6  Pain Location: back Pain Descriptors / Indicators: Aching Pain Intervention(s): Monitored during  session     Extremity/Trunk Assessment Upper Extremity Assessment Upper Extremity Assessment: RUE deficits/detail;LUE deficits/detail RUE Deficits / Details: Pt reports B hand numbness to all fingers LUE Deficits / Details: Pt reports B hand numbness to all fingers           Communication Communication Communication: No apparent difficulties   Cognition Arousal: Lethargic Behavior During Therapy: Flat affect Overall Cognitive  Status: No family/caregiver present to determine baseline cognitive functioning                                       General Comments  VSS on RA. Psych present end of session. Pt frustrated by not being able to eat yet    Exercises     Shoulder Instructions      Home Living Family/patient expects to be discharged to:: Assisted living                             Home Equipment: Wheelchair - manual   Additional Comments: [facility], pt reports use of WC and initially use of lift to [facility], then later reports she stand for transfer. pt reports staff assist with ADLs      Prior Functioning/Environment Prior Level of Function : History of Falls (last six months);Patient poor historian/Family not available             Mobility Comments: pt reports uses WC for years and initially said lift to [facility],  but later stated people help me, I just stand up ADLs Comments: pt reports she needs a lot of help from staff        OT Problem List: Decreased strength;Decreased range of motion;Decreased activity tolerance;Impaired balance (sitting and/or standing);Decreased coordination;Decreased cognition;Decreased safety awareness;Impaired UE functional use;Pain      OT Treatment/Interventions: Self-care/ADL training;Therapeutic exercise;Energy conservation;DME and/or AE instruction;Therapeutic activities;Balance training;Patient/family education    OT Goals(Current goals can be found in the care plan section) Acute Rehab OT Goals Patient Stated Goal: not able to participate in goal setting OT Goal Formulation: Patient unable to participate in goal setting Time For Goal Achievement: 07/06/23 Potential to Achieve Goals: Fair  OT Frequency: Min 1X/week    Co-evaluation              AM-PAC OT 6 Clicks Daily Activity     Outcome Measure Help from another person eating meals?: A Lot Help from another person taking care of personal grooming?: A Lot Help  from another person toileting, which includes using toliet, bedpan, or urinal?: Total Help from another person bathing (including washing, rinsing, drying)?: A Lot Help from another person to put on and taking off regular upper body clothing?: A Lot Help from another person to put on and taking off regular lower body clothing?: Total 6 Click Score: 10   End of Session Nurse Communication: Mobility status;Other (comment) (needing help with feeding)  Activity Tolerance: Patient limited by fatigue;Patient limited by lethargy Patient left: in bed;with call bell/phone within reach;with bed alarm set  OT Visit Diagnosis: Unsteadiness on feet (R26.81);Other abnormalities of gait and mobility (R26.89);Muscle weakness (generalized) (M62.81);Pain;Other symptoms and signs involving cognitive function;Feeding difficulties (R63.3) Pain - part of body:  (back)                Time: 8674-8650 OT Time Calculation (min): 24 min Charges:  OT General Charges $OT Visit: 1  Visit OT Evaluation $OT Eval Moderate Complexity: 1 Mod OT Treatments $Self Care/Home Management : 8-22 mins  [address] OTR/L   Elouise JONELLE Bott 06/22/2023, 2:04 PM

## 2023-06-22 NOTE — Progress Notes (Signed)
 PROGRESS NOTE    Annette Martin  FMW:[id number] DOB: 1955/07/26 DOA: 06/20/2023 PCP: Annette Kerney SQUIBB, MD   Brief Narrative:    Annette Martin is a 68 y.o. female with hx of schizophrenia, diabetes, hyperlipidemia, hypothyroidism, urinary incontinence, who was brought in from [facility] for altered mental status.  She was admitted for acute metabolic encephalopathy in the setting of influenza A infection as well as some dehydration.  She is noted to have some bradykinesia with cogwheel rigidity and resting tremor suspicious for EPS.  EEG performed with results pending.  Psychiatry consulted for assistance in management of medications.  PT recommending SNF.  Assessment & Plan:   Principal Problem:   Encephalopathy Active Problems:   Influenza A   Dehydration  Assessment and Plan:   Encephalopathy acute, metabolic with bradykinesia and resting tremor Encephalopathic since 1/31.  Reportedly leading to one side per ALF.  Exam with prominent somnolence, no focal deficits noted.  Lab evaluation notable for positive flu A, findings of dehydration with borderline lactate, UA with ketone and hyaline cast. CT head negative for acute findings.  Suspect her encephalopathy is multifactorial related to her influenza A infection, dehydration, medication effects -Management of influenza A per below -Management of dehydration per below - Continue Zoloft  and Depakote  and hold Haldol  due to concern for EPS continue Ativan  and Benadryl  as needed -Appreciate psychiatry for adjustment of antipsychotic medications -EEG performed with results pending   Influenza A without significant pneumonia - Tamiflu  75 mg twice daily for 5 days - Albuterol  prn, incentive spirometer   Dehydration Deconditioning -PT evaluation recommending SNF   Chronic medical problems: History of schizophrenia: Continue home Haldol , valproate, sertraline . Hold evening dose of Clonazepam  ?  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  Glucose within goal range can add SSI if runs high. ? Neuropathy: Gabapentin  resumed HLD: Continue home atorvastatin  Hypothyroidism: TSH 3.4 Urinary incontinence: Continue home bethanechol  3 times daily and fesoteridine nightly GERD: home PPI    DVT prophylaxis: SCDs Code Status: Full Family Communication: Discussed with cousin [name] 2/4 Disposition Plan:  Status is: Inpatient Remains inpatient appropriate because: Need for IV medications and close monitoring  Consultants:  Psychiatry  Procedures:  EEG performed 2/4  Antimicrobials:  Anti-infectives (From admission, onward)    Start     Dose/Rate Route Frequency Ordered Stop   06/20/23 2345  oseltamivir  (TAMIFLU ) capsule 75 mg        75 mg Oral 2 times daily 06/20/23 2331 06/25/23 2159       Subjective: Patient seen and evaluated today and will yell out occasionally.  This is apparently near her baseline.  She was noted to have bilateral arm shakiness and knee shakiness overnight and there was concern for EPS versus seizure.  EEG performed earlier today with results pending.  Objective: Vitals:   06/22/23 0012 06/22/23 0020 06/22/23 0503 06/22/23 0732  BP: (!) 121/99 (!) 121/99 128/67 (!) 128/44  Pulse: (!) 103 (!) 103 84 82  Resp:   18 18  Temp:  98.1 F (36.7 C) 98 F (36.7 C) 98 F (36.7 C)  TempSrc:  Oral Oral   SpO2:  93% 90% 91%   No intake or output data in the 24 hours ending 06/22/23 1122  There were no vitals filed for this visit.  Examination:  General exam: Appears calm and comfortable  Respiratory system: Clear to auscultation. Respiratory effort normal. Cardiovascular system: S1 & S2 heard, RRR.  Gastrointestinal  system: Abdomen is soft Central nervous system: Yelling out infrequently Extremities: No edema Skin: No significant lesions noted Psychiatry: Flat affect.    Data Reviewed: I have personally reviewed following labs and  imaging studies  CBC: Recent Labs  Lab 06/20/23 1959 06/20/23 2058  WBC 4.2  --   NEUTROABS 2.2  --   HGB 12.2 12.6  HCT 38.1 37.0  MCV 89.2  --   PLT 173  --    Basic Metabolic Panel: Recent Labs  Lab 06/20/23 1959 06/20/23 2058 06/22/23 0518  NA 139 139 140  K 3.8 3.8 3.3*  CL 97*  --  99  CO2 28  --  28  GLUCOSE 122*  --  108*  BUN 19  --  11  CREATININE 1.04*  --  0.75  CALCIUM  8.9  --  8.4*  MG  --   --  1.5*   GFR: Estimated Creatinine Clearance: 79.7 mL/min (by C-G formula based on SCr of 0.75 mg/dL). Liver Function Tests: Recent Labs  Lab 06/20/23 1959  AST 35  ALT 21  ALKPHOS 73  BILITOT 0.6  PROT 6.7  ALBUMIN 3.4*   No results for input(s): LIPASE, AMYLASE in the last 168 hours. Recent Labs  Lab 06/20/23 1959  AMMONIA 20   Coagulation Profile: No results for input(s): INR, PROTIME in the last 168 hours. Cardiac Enzymes: No results for input(s): CKTOTAL, CKMB, CKMBINDEX, TROPONINI in the last 168 hours. BNP (last 3 results) No results for input(s): PROBNP in the last 8760 hours. HbA1C: No results for input(s): HGBA1C in the last 72 hours. CBG: Recent Labs  Lab 06/21/23 2337  GLUCAP 112*   Lipid Profile: No results for input(s): CHOL, HDL, LDLCALC, TRIG, CHOLHDL, LDLDIRECT in the last 72 hours. Thyroid Function Tests: Recent Labs    06/20/23 1959  TSH 3.483   Anemia Panel: No results for input(s): VITAMINB12, FOLATE, FERRITIN, TIBC, IRON, RETICCTPCT in the last 72 hours. Sepsis Labs: Recent Labs  Lab 06/20/23 2059  LATICACIDVEN 1.9    Recent Results (from the past 240 hours)  Resp panel by RT-PCR (RSV, Flu A&B, Covid) Anterior Nasal Swab     Status: Abnormal   Collection Time: 06/20/23  7:59 PM   Specimen: Anterior Nasal Swab  Result Value Ref Range Status   SARS Coronavirus 2 by RT PCR NEGATIVE NEGATIVE Final   Influenza A by PCR POSITIVE (A) NEGATIVE Final   Influenza B by PCR  NEGATIVE NEGATIVE Final    Comment: (NOTE) The Xpert Xpress SARS-CoV-2/FLU/RSV plus assay is intended as an aid in the diagnosis of influenza from Nasopharyngeal swab specimens and should not be used as a sole basis for treatment. Nasal washings and aspirates are unacceptable for Xpert Xpress SARS-CoV-2/FLU/RSV testing.  Fact Sheet for Patients: bloggercourse.com  Fact Sheet for Healthcare Providers: seriousbroker.it  This test is not yet approved or cleared by the [place]  FDA and has been authorized for detection and/or diagnosis of SARS-CoV-2 by FDA under an Emergency Use Authorization (EUA). This EUA will remain in effect (meaning this test can be used) for the duration of the COVID-19 declaration under Section 564(b)(1) of the Act, 21 U.S.C. section 360bbb-3(b)(1), unless the authorization is terminated or revoked.     Resp Syncytial Virus by PCR NEGATIVE NEGATIVE Final    Comment: (NOTE) Fact Sheet for Patients: bloggercourse.com  Fact Sheet for Healthcare Providers: seriousbroker.it  This test is not yet approved or cleared by the [place]  FDA and has been  authorized for detection and/or diagnosis of SARS-CoV-2 by FDA under an Emergency Use Authorization (EUA). This EUA will remain in effect (meaning this test can be used) for the duration of the COVID-19 declaration under Section 564(b)(1) of the Act, 21 U.S.C. section 360bbb-3(b)(1), unless the authorization is terminated or revoked.  Performed at [facility], [address] [place], [place] [address]   Respiratory (~20 pathogens) panel by PCR     Status: Abnormal   Collection Time: 06/21/23  2:37 PM   Specimen: Nasopharyngeal Swab; Respiratory  Result Value Ref Range Status   Adenovirus NOT DETECTED NOT DETECTED Final   Coronavirus 229E NOT DETECTED NOT DETECTED Final    Comment: (NOTE) The  Coronavirus on the Respiratory Panel, DOES NOT test for the novel  Coronavirus (2019 nCoV)    Coronavirus HKU1 NOT DETECTED NOT DETECTED Final   Coronavirus NL63 NOT DETECTED NOT DETECTED Final   Coronavirus OC43 NOT DETECTED NOT DETECTED Final   Metapneumovirus NOT DETECTED NOT DETECTED Final   Rhinovirus / Enterovirus NOT DETECTED NOT DETECTED Final   Influenza A H3 DETECTED (A) NOT DETECTED Final   Influenza B NOT DETECTED NOT DETECTED Final   Parainfluenza Virus 1 NOT DETECTED NOT DETECTED Final   Parainfluenza Virus 2 NOT DETECTED NOT DETECTED Final   Parainfluenza Virus 3 NOT DETECTED NOT DETECTED Final   Parainfluenza Virus 4 NOT DETECTED NOT DETECTED Final   Respiratory Syncytial Virus NOT DETECTED NOT DETECTED Final   Bordetella pertussis NOT DETECTED NOT DETECTED Final   Bordetella Parapertussis NOT DETECTED NOT DETECTED Final   Chlamydophila pneumoniae NOT DETECTED NOT DETECTED Final   Mycoplasma pneumoniae NOT DETECTED NOT DETECTED Final    Comment: Performed at [facility], [address], [place], [place] [address]         Radiology Studies: DG Chest Port 1 View Result Date: 06/20/2023 CLINICAL DATA:  Altered mental status. EXAM: PORTABLE CHEST 1 VIEW COMPARISON:  02/14/2023 FINDINGS: Shallow inspiration. Heart size and pulmonary vascularity are normal. Lungs are clear. No pleural effusions. No pneumothorax. Mediastinal contours appear intact. Degenerative changes in the spine and shoulders. Surgical clips in the right upper quadrant. IMPRESSION: Shallow inspiration.  No evidence of active pulmonary disease. Electronically Signed   By: Elsie Gravely M.D.   On: 06/20/2023 20:28   CT Head Wo Contrast Result Date: 06/20/2023 CLINICAL DATA:  Mental status change EXAM: CT HEAD WITHOUT CONTRAST TECHNIQUE: Contiguous axial images were obtained from the base of the skull through the vertex without intravenous contrast. RADIATION DOSE REDUCTION: This exam was performed  according to the departmental dose-optimization program which includes automated exposure control, adjustment of the mA and/or kV according to patient size and/or use of iterative reconstruction technique. COMPARISON:  CT 06/12/2023, MRI 06/12/2023, 10/01/2022, 04/21/2023 FINDINGS: Brain: No acute territorial infarction, hemorrhage, or intracranial mass. Mild atrophy. Stable ventriculomegaly. Mild chronic small vessel ischemic changes of the white matter Vascular: No hyperdense vessel or unexpected calcification. Skull: Normal. Negative for fracture or focal lesion. Sinuses/Orbits: No acute finding. Other: None IMPRESSION: 1. No CT evidence for acute intracranial abnormality. 2. Mild atrophy and chronic small vessel ischemic changes of the white matter. Stable ventriculomegaly. Electronically Signed   By: Luke Bun M.D.   On: 06/20/2023 19:52        Scheduled Meds:  amantadine   100 mg Oral BID   atorvastatin   10 mg Oral QHS   bethanechol   25 mg Oral TID   divalproex   250 mg  Oral TID   docusate sodium   100 mg Oral BID   ferrous sulfate   325 mg Oral Q breakfast   fesoterodine   4 mg Oral QHS   gabapentin   300 mg Oral TID   oseltamivir   75 mg Oral BID   pantoprazole   40 mg Oral BID   sertraline   200 mg Oral QHS      LOS: 1 day    Time spent: 55 minutes    Yudit Modesitt JONETTA Fairly, DO [facility]  If 7PM-7AM, please contact night-coverage [email or web address] 06/22/2023, 11:22 AM

## 2023-06-22 NOTE — Evaluation (Signed)
 Clinical/Bedside Swallow Evaluation Patient Details  Name: HALI BALGOBIN MRN: [id number] Date of Birth: January 22, 1956  Today's Date: 06/22/2023 Time: SLP Start Time (ACUTE ONLY): 1250 SLP Stop Time (ACUTE ONLY): 1310 SLP Time Calculation (min) (ACUTE ONLY): 20 min  Past Medical History:  Past Medical History:  Diagnosis Date   Depression    Diabetes mellitus    type 2   GERD (gastroesophageal reflux disease)    Hypertension    Hypoxemia    Mental retardation    Neuroleptic malignant syndrome    Rhabdomyolysis    Schizophrenia (HCC)    Thyroid disease    Vitamin D deficient osteomalacia    Past Surgical History:  Past Surgical History:  Procedure Laterality Date   COLONOSCOPY  2010   MANN   HPI:  Patient is a 68 y.o. female with PMH: schizophrenia, DM, HLD, hypothyroidism, urinary incontinence who was brought to hospital from [facility] for AMS on 06/20/23 In ED, she was somnolent, CXR did not show evidence of active pulmonary disease, CT head and MRI brain both negatie for acute intracranial abnormality. SLP swallow evaluation ordered secondary to patient with excessive coughing with PO intake.    Assessment / Plan / Recommendation  Clinical Impression  Patient presents with clinical s/s of dysphagia as per this bedside swallow evaluation with SLP suspecting significant impact from her h/o GERD. Patient was impulsive with drinking liquids via cup or straw and impulsive with eating solids (sugar cookie). Of note, during chart review, SLP read in prior notes (September 2024) which indicted h/o impulsive eating without fully masticating solids. During today's evaluation, she did not exhibit any immediate cough or throat clearing but did exhibit instances of delayed coughing episodes that lasted for several seconds at times and occured following solids and liquids. Per RN, patient has tolerated liquids and medications well today but with solid foods she has exhibited coughing  episodes. SLP downgrading diet to Dys 2 (minced) solids, continue with thin liquids and proceed with MBS next date to r/o aspiration. SLP Visit Diagnosis: Dysphagia, unspecified (R13.10)    Aspiration Risk  Mild aspiration risk    Diet Recommendation Dysphagia 2 (Fine chop);Thin liquid    Liquid Administration via: Cup;Straw Medication Administration: Whole meds with puree Supervision: Full supervision/cueing for compensatory strategies;Staff to assist with self feeding Compensations: Slow rate;Small sips/bites Postural Changes: Seated upright at 90 degrees;Remain upright for at least 30 minutes after po intake    Other  Recommendations Oral Care Recommendations: Oral care BID    Recommendations for follow up therapy are one component of a multi-disciplinary discharge planning process, led by the attending physician.  Recommendations may be updated based on patient status, additional functional criteria and insurance authorization.  Follow up Recommendations Other (comment) (TBD pending MBS)      Assistance Recommended at Discharge    Functional Status Assessment Patient has had a recent decline in their functional status and demonstrates the ability to make significant improvements in function in a reasonable and predictable amount of time.  Frequency and Duration min 2x/week  1 week       Prognosis Prognosis for improved oropharyngeal function: Good Barriers to Reach Goals: Cognitive deficits      Swallow Study   General Date of Onset: 06/22/23 HPI: Patient is a 68 y.o. female with PMH: schizophrenia, DM, HLD, hypothyroidism, urinary incontinence who was brought to hospital from [facility] for AMS on 06/20/23 In ED, she was somnolent, CXR did not show evidence of active pulmonary  disease, CT head and MRI brain both negatie for acute intracranial abnormality. SLP swallow evaluation ordered secondary to patient with excessive coughing with PO intake. Type of Study: Bedside  Swallow Evaluation Previous Swallow Assessment: none found Diet Prior to this Study: Regular;Thin liquids (Level 0) Temperature Spikes Noted: No Respiratory Status: Room air History of Recent Intubation: No Behavior/Cognition: Alert;Cooperative;Pleasant mood Oral Cavity Assessment: Within Functional Limits;Dry Oral Care Completed by SLP: No Oral Cavity - Dentition: Adequate natural dentition;Poor condition Self-Feeding Abilities: Needs set up;Needs assist;Able to feed self Patient Positioning: Upright in bed Baseline Vocal Quality: Normal Volitional Cough: Cognitively unable to elicit Volitional Swallow: Unable to elicit    Oral/Motor/Sensory Function Overall Oral Motor/Sensory Function: Within functional limits   Ice Chips     Thin Liquid Thin Liquid: Impaired Presentation: Straw;Cup Pharyngeal  Phase Impairments: Cough - Delayed    Nectar Thick     Honey Thick     Puree Puree: Not tested   Solid     Solid: Impaired Presentation: Self Fed Oral Phase Functional Implications: Impaired mastication Pharyngeal Phase Impairments: Cough - Delayed     [name] IVAR Blase, MA, CCC-SLP Speech Therapy

## 2023-06-22 NOTE — Consult Note (Signed)
 [facility] Psychiatric Consult Initial  Patient Name: [name]  MRN: [id number]  DOB: 01/29/1956  Consult Order details:  Orders (From admission, onward)     Start     Ordered   06/22/23 0002  IP CONSULT TO PSYCHIATRY       Ordering Provider: Sundil, Subrina, MD  Provider:  (Not yet assigned)  Question Answer Comment  Location [facility]   Reason for Consult? Extrapyramidal syndrome side effect of Haldol .  Need medication readjustment.      06/22/23 0001             Mode of Visit: In person    Psychiatry Consult Evaluation  Service Date: June 22, 2023 LOS:  LOS: 1 day  Chief Complaint: Altered Mental Status from Flu  Primary Psychiatric Diagnoses  Drug-induced parkinsonism, extra pyramidal symptoms 2.  Schizophrenia 3. Intellectual Disability 4.  Behavioral problems  Assessment  Annette Martin is a 68 y.o. female admitted: Medically for 06/20/2023  7:02 PM for encephalopathy. She carries the psychiatric diagnoses of schizophrenia, intellectual disability, depression and has a past medical history of  thyroid disease, hypertension, T2DM, Rhabdomyolisis, Vitamin D deficient osteomalacia,.   Her current presentation of altered mental status, mixed rigidity, asymmetric tremor, pill rolling behavior and diaphoresis is most consistent with drug-induced parkinsonism in the setting of antipsychotic use She meets criteria for drug-induced parkinsonism based on history by patient, chart-reviewed history, physical presentation. Current outpatient psychotropic medications include haloperidol  5 mg TID, depakote  250 DR TID, gabapentin  300 mg TID, amantadine  100 mg BID, clonazepam  0.5 mg TID, and diphenhydramine  25 mg TID and historically she has had a modest response to these medications. She was compliant with medications prior to admission as evidenced by facility records. On initial examination, patient demonstrated . Please see plan below for detailed  recommendations.   Diagnoses:  Active Hospital problems: Principal Problem:   Encephalopathy Active Problems:   Influenza A   Dehydration    Plan   ## Psychiatric Medication Recommendations:  -- Reduce clonazepam  0.5 mg TID PRN to BID PRN -- Reduce diphenhydramine  25 mg TID PRN to BID PRN -- Continue sertraline  200 mg at bedtime  -- Continue divalproex  250 mg TID  -- Continue gabapentin  300 mg TID -- Continue amantadine  100 mg BID -- Hold haldol  except for extreme agitation  Patient is on a high number of medications with possible psychotropic effects that can also cause delirium or her parkinsonism-like symptoms. She would benefit from a significant streamlining of her outpatient medications. For now, the haldol  seems to be the most likely culprit for her extrapyramidal symptoms. Patient would be better served with a second generation antipsychotic. Could also consider reducing the zoloft  with the treatment of the quetiapine .  -- Start quetiapine  XR 50 mg at bedtime for schizophrenia,  plan to eventually increase upwards in 25 mg increments to targeted dosage of 150 mg nightly to control schizophrenia symptoms   ## Medical Decision Making Capacity: Patient has a guardian and has thus been adjudicated incompetent; please involve patients guardian in medical decision making Patient's primary guardian is in jail.  ## Further Work-up:  -- Added on Creatinine Kinase to previous collection. While pt on Qtc prolonging medications, please monitor & replete K+ to 4 and Mg2+ to 2 -- most recent EKG on 06/20/2023 had QtC of 443 ms -- Pertinent labwork reviewed earlier this admission includes: venous blood gases, CMP, CBC, respiratory panel, UA   ## Disposition:-- There are no psychiatric  contraindications to discharge at this time  ## Behavioral / Environmental: -Delirium Precautions: Delirium Interventions for Nursing and Staff: - RN to open blinds every AM. - To Bedside: Glasses, hearing  aide, and pt's own shoes. Make available to patients. when possible and encourage use. - Encourage po fluids when appropriate, keep fluids within reach. - OOB to chair with meals. - Passive ROM exercises to all extremities with AM & PM care. - RN to assess orientation to person, time and place QAM and PRN. - Recommend extended visitation hours with familiar family/friends as feasible. - Staff to minimize disturbances at night. Turn off television when pt asleep or when not in use.    ## Safety and Observation Level:  - Based on my clinical evaluation, I estimate the patient to be at low risk of self harm in the current setting. - At this time, we recommend  routine. This decision is based on my review of the chart including patient's history and current presentation, interview of the patient, mental status examination, and consideration of suicide risk including evaluating suicidal ideation, plan, intent, suicidal or self-harm behaviors, risk factors, and protective factors. This judgment is based on our ability to directly address suicide risk, implement suicide prevention strategies, and develop a safety plan while the patient is in the clinical setting. Please contact our team if there is a concern that risk level has changed.  CSSR Risk Category:   Suicide Risk Assessment: Patient has following modifiable risk factors for suicide: none which we are addressing by continuing with planned management. Patient has following non-modifiable or demographic risk factors for suicide: psychiatric hospitalization Patient has the following protective factors against suicide: Access to outpatient mental health care  Thank you for this consult request. Recommendations have been communicated to the primary team.  We will follow at this time.   Lynwood Morene Lavone Delsie, MD PGY-1       History of Present Illness  Relevant Aspects of [facility] Course:  Admitted on 06/20/2023 for encephalopathy in  the setting of Influenza A. They are currently inpatient being treated for the flu, when team noticed increased rigidity, diaphoresis, and increased tremor.   Patient Report:  Met patient bedside today twice. Once alone, second time with attending physician Dr Harrison and the Physical Therapy team. The patient reported that her mood was okay, she was not feeling well, and mentioned how hot and sweaty she has been. She mentioned that she has muscle and joint pains in much of her body in addition to her other flu-like symptoms. She vocalized that she understood her past psychiatric diagnosis of schizophrenia and says that she takes medications for it.  Psych ROS:  Depression: denied symptoms of depression other than sleep changes and very recent poor mood due to illness Anxiety:  Denies Mania (lifetime and current): Denies Psychosis: (lifetime and current): Did not understand question.  Collateral information:  Pt has legal guardian, but he is currently in prison.   Will consult facility closer to discharge.  Collateral information obtained Charolotte Jules, patient's cousin) Patient granted permission to speak to contact person without restrictions.  Reported that this was the fourth call he has had about his cousin. Has not seen her in over a year. Reported that he last saw her moving like she was doing the thorazine shuffle, indicating that some of this parkinsonism might go back prior to this admission.  Collateral contact denies presence of firearms or large stockpiles of pills at home.   During  this conversation, I explained in simple terms the patient's mental health condition, answered questions pertaining to the patient's current treatment and provided updates, and outlined the treatment plan moving forward.  Lynwood Morene Lavone Delsie, MD 06/22/2023 12:52 PM  Review of Systems  Constitutional:  Positive for chills, diaphoresis, fever and malaise/fatigue. Negative for weight  loss.  HENT:  Negative for hearing loss.   Respiratory:  Positive for cough, sputum production and shortness of breath.   Cardiovascular:  Negative for chest pain.  Gastrointestinal:  Positive for abdominal pain and constipation.  Musculoskeletal:  Positive for myalgias.  Neurological:  Positive for weakness and headaches.  Psychiatric/Behavioral:  Positive for depression. Negative for hallucinations, memory loss, substance abuse and suicidal ideas. The patient is nervous/anxious. The patient does not have insomnia.      Psychiatric and Social History  Psychiatric History:  Information collected from chart   Prev Dx/Sx: Schizophrenia, IDD. Current Psych Provider[facility] Meds (current):  Current Outpatient Medications  Medication Instructions  . amantadine  (SYMMETREL ) 100 mg, Oral, 2 times daily  . atorvastatin  (LIPITOR) 10 mg, Oral, Daily at bedtime  . bethanechol  (URECHOLINE ) 25 mg, Oral, 3 times daily  . clonazePAM  (KLONOPIN ) 0.5 mg, Oral, 3 times daily  . divalproex  (DEPAKOTE ) 250 mg, Oral, 3 times daily  . docusate sodium  (COLACE) 200 mg, Oral, 2 times daily  . ferrous sulfate  325 mg, Oral, Daily with breakfast  . fesoterodine  (TOVIAZ ) 4 mg, Oral, Daily at bedtime  . gabapentin  (NEURONTIN ) 300 mg, Oral, 3 times daily  . haloperidol  (HALDOL ) 5 mg, Oral, 2 times daily  . lactulose  (CHRONULAC ) 20 g, Oral, Daily  . lidocaine  (LIDODERM ) 5 % 1 patch, Transdermal, Every 24 hours, Remove & Discard patch within 12 hours or as directed by MD  . losartan  (COZAAR ) 25 mg, Oral, Daily  . Magnesium  400 MG CAPS 1 tablet, Oral, 2 times daily  . meclizine (ANTIVERT) 25 mg, 3 times daily PRN  . meloxicam (MOBIC) 7.5 mg, Oral, Daily  . metFORMIN  (GLUCOPHAGE ) 1,000 mg, Oral, 2 times daily with meals  . ondansetron  (ZOFRAN ) 4 mg, Every 6 hours PRN  . pantoprazole  (PROTONIX ) 40 mg, Oral, 2 times daily  . sertraline  (ZOLOFT ) 200 mg, Oral, Daily at bedtime  Previous Med Trials: ziprasidone ,  risperidone , hydroxyzine , quetiapine , temazepam , valbenazine  tosylate Therapy: none  Prior Psych Hospitalization: 2019  Prior Self Harm: Unable to assess. Prior Violence: Unable to assess.  Family Psych History: No history on file, Unable to assess from patient reports. Family Hx suicide: No history on file, Unable to assess from patient reports.  Social History:  Developmental Hx: Intellectually disabled. Educational Hx: No history on file, Unable to assess from patient reports. Occupational Hx: disabled Legal Hx: chart review does not provide legal history, patient not able to communicate. Living Situation: Patient lives in assisted living facility [facility]  [address]  [facility] [place] [address]  Spiritual Hx: no Access to weapons/lethal means: Denies (per cousin, Sharlotte Baka)   Substance History Alcohol: Denies  Type of alcohol Denies  Last Drink Denies  Number of drinks per day Denies  Tobacco: Denies  Illicit drugs: Denies  Prescription drug abuse: Denies  Rehab hx: Denies   Exam Findings  Physical Exam:  Vital Signs:  Temp:  [98 F (36.7 C)-99.4 F (37.4 C)] 98 F (36.7 C) (02/04 0732) Pulse Rate:  [72-103] 82 (02/04 0732) Resp:  [18-20] 18 (02/04 0732) BP: (121-203)/(44-185) 128/44 (02/04 0732) SpO2:  [89 %-98 %] 91 % (  02/04 0732) Blood pressure (!) 128/44, pulse 82, temperature 98 F (36.7 C), resp. rate 18, SpO2 91%. There is no height or weight on file to calculate BMI.  Physical Exam Vitals and nursing note reviewed.  Constitutional:      Appearance: She is ill-appearing and diaphoretic.  HENT:     Head: Normocephalic and atraumatic.     Nose: Congestion present.  Pulmonary:     Breath sounds: Rales present.  Abdominal:     Palpations: Abdomen is soft.  Skin:    General: Skin is warm.  Neurological:     Mental Status: She is alert. Mental status is at baseline.     Gait: Gait abnormal.  Psychiatric:        Attention and Perception:  Attention and perception normal.        Mood and Affect: Mood is depressed.        Speech: Speech normal.        Behavior: Behavior is cooperative.        Thought Content: Thought content is not paranoid or delusional. Thought content does not include homicidal or suicidal ideation.        Cognition and Memory: Memory normal. Cognition is impaired.        Judgment: Judgment normal.     Mental Status Exam: General Appearance: Disheveled  Orientation:  Other:  Name, place  Memory:  Immediate;   Good Remote;   Fair  Concentration:  Concentration: Good  Recall:  NA  Attention  Good  Eye Contact:  Good  Speech:  Slow and Slurred  Language:  Fair  Volume:  Decreased  Mood: I don't feel great  Affect:  Appropriate  Thought Process:  Coherent and Linear  Thought Content:  WDL  Suicidal Thoughts:  No  Homicidal Thoughts:  No  Judgement:  Intact  Insight:  Shallow  Psychomotor Activity:  EPS, Increased, Mannerisms, Restlessness, and Tremor  Akathisia:  Yes  Fund of Knowledge:  Fair      Assets:  Desire for Improvement Leisure Time Vocational/Educational  Cognition:  Impaired,  Mild  ADL's:  Impaired  AIMS (if indicated):        Other History   These have been pulled in through the EMR, reviewed, and updated if appropriate.  Family History:  The patient's family history is not on file.  Medical History: Past Medical History:  Diagnosis Date  . Depression   . Diabetes mellitus    type 2  . GERD (gastroesophageal reflux disease)   . Hypertension   . Hypoxemia   . Mental retardation   . Neuroleptic malignant syndrome   . Rhabdomyolysis   . Schizophrenia (HCC)   . Thyroid disease   . Vitamin D deficient osteomalacia     Surgical History: Past Surgical History:  Procedure Laterality Date  . COLONOSCOPY  2010   MANN     Medications:   Current Facility-Administered Medications:  .  acetaminophen  (TYLENOL ) tablet 1,000 mg, 1,000 mg, Oral, Q6H PRN, Segars,  Dorn, MD .  albuterol  (PROVENTIL ) (2.5 MG/3ML) 0.083% nebulizer solution 2.5 mg, 2.5 mg, Nebulization, Q4H PRN, Segars, Dorn, MD .  amantadine  (SYMMETREL ) capsule 100 mg, 100 mg, Oral, BID, Segars, Dorn, MD, 100 mg at 06/22/23 0841 .  atorvastatin  (LIPITOR) tablet 10 mg, 10 mg, Oral, QHS, Segars, Dorn, MD, 10 mg at 06/22/23 0001 .  bethanechol  (URECHOLINE ) tablet 25 mg, 25 mg, Oral, TID, Segars, Dorn, MD, 25 mg at 06/22/23 0841 .  clonazePAM  (KLONOPIN ) tablet  0.5 mg, 0.5 mg, Oral, TID PRN, Segars, Jonathan, MD, 0.5 mg at 06/21/23 1336 .  diphenhydrAMINE  (BENADRYL ) capsule 25 mg, 25 mg, Oral, Q8H PRN, Maree, Pratik D, DO .  divalproex  (DEPAKOTE ) DR tablet 250 mg, 250 mg, Oral, TID, Segars, Dorn, MD, 250 mg at 06/22/23 0840 .  docusate sodium  (COLACE) capsule 100 mg, 100 mg, Oral, BID, Segars, Dorn, MD, 100 mg at 06/22/23 0841 .  ferrous sulfate  tablet 325 mg, 325 mg, Oral, Q breakfast, Segars, Dorn, MD, 325 mg at 06/22/23 0840 .  fesoterodine  (TOVIAZ ) tablet 4 mg, 4 mg, Oral, QHS, Segars, Dorn, MD, 4 mg at 06/22/23 0001 .  gabapentin  (NEURONTIN ) capsule 300 mg, 300 mg, Oral, TID, Maree, Pratik D, DO, 300 mg at 06/22/23 0840 .  hydrALAZINE  (APRESOLINE ) injection 10 mg, 10 mg, Intravenous, Q6H PRN, Sundil, Subrina, MD .  lactulose  (CHRONULAC ) 10 GM/15ML solution 10 g, 10 g, Oral, Daily PRN, Segars, Jonathan, MD .  LORazepam  (ATIVAN ) injection 2 mg, 2 mg, Intravenous, Q4H PRN, Maree, Pratik D, DO, 2 mg at 06/21/23 1558 .  magnesium  sulfate IVPB 2 g 50 mL, 2 g, Intravenous, Once, Maree, Pratik D, DO, Last Rate: 50 mL/hr at 06/22/23 0846, 2 g at 06/22/23 0846 .  melatonin tablet 6 mg, 6 mg, Oral, QHS PRN, Segars, Jonathan, MD .  ondansetron  (ZOFRAN ) injection 4 mg, 4 mg, Intravenous, Q6H PRN, Segars, Dorn, MD .  oseltamivir  (TAMIFLU ) capsule 75 mg, 75 mg, Oral, BID, Segars, Dorn, MD, 75 mg at 06/22/23 0841 .  pantoprazole  (PROTONIX ) EC tablet 40 mg, 40 mg, Oral,  BID, Segars, Dorn, MD, 40 mg at 06/22/23 0840 .  sertraline  (ZOLOFT ) tablet 200 mg, 200 mg, Oral, QHS, Segars, Jonathan, MD, 200 mg at 06/22/23 0002  Allergies: No Known Allergies  Lynwood Morene Lavone Delsie, MD

## 2023-06-22 NOTE — Progress Notes (Signed)
  RN reported that patient O2 sat 90% on room air currently on 92% on 2 L oxygen.  Patient has been admitted for influenza infection. Also patient blood pressure is soft 92/42. Giving 1 L of half-normal saline bolus. Continue to monitor blood pressure.   Goodwin Kamphaus, MD [facility] 06/22/2023, 8:51 PM

## 2023-06-22 NOTE — Plan of Care (Signed)

## 2023-06-22 NOTE — Progress Notes (Signed)
 Physical Therapy Treatment Patient Details Name: Annette Martin MRN: [id number] DOB: 1955-09-16 Today's Date: 06/22/2023   History of Present Illness The pt is a 68 yo female presenting from ALF on 2/2 with AMS. Work up revealed: acute metabolic encephalopathy, influenza A, and dehydration. PMH includes: schizophrenia, DM II, HLD, HTN, hypothyroidism, and urinary incontinence.    PT Comments  Pt received in bed and had been crying out but calm when working with therapy and performing externally directed mobility. Pt needed max A to come to EOB and had strong posterior lean initially with mod A needed to remain upright. Balance EOB improved with reaching activities. Pt with shaking in arms with all active motion. Pt stood bedside with max A +2 but was unable to step fwd or side and had strong posterior lean throughout. VSS. Patient will benefit from continued inpatient follow up therapy, <3 hours/day. PT will continue to follow.     If plan is discharge home, recommend the following: Two people to help with walking and/or transfers;Two people to help with bathing/dressing/bathroom;Assistance with cooking/housework;Direct supervision/assist for medications management;Direct supervision/assist for financial management;Assist for transportation;Help with stairs or ramp for entrance;Supervision due to cognitive status   Can travel by private vehicle     No  Equipment Recommendations  None recommended by PT (per pt, has WC and lift at ALF)    Recommendations for Other Services OT consult     Precautions / Restrictions Precautions Precautions: Fall Restrictions Weight Bearing Restrictions Per Provider Order: No     Mobility  Bed Mobility Overal bed mobility: Needs Assistance Bed Mobility: Rolling, Sit to Supine, Supine to Sit Rolling: Used rails, Mod assist   Supine to sit: Max assist, Used rails Sit to supine: Total assist, +2 for safety/equipment, Used rails   General bed mobility  comments: pt rolled each way with mod A and hand over hand guidance for use of rail. Came to sitting with max A at LE's and trunk. Pt needed tot A to return to supine due to sitting on very EOB and unable to scoot hips back due to retropulsion with posterior pelvic tilt    Transfers Overall transfer level: Needs assistance Equipment used: 2 person hand held assist Transfers: Sit to/from Stand Sit to Stand: Max assist, +2 physical assistance           General transfer comment: strong posterior lean in standing, braced against bed with back of legs. Knees buckling when wt brought fwd    Ambulation/Gait               General Gait Details: unable to step feet fwd or side in standing   Stairs             Wheelchair Mobility     Tilt Bed    Modified Rankin (Stroke Patients Only)       Balance Overall balance assessment: Needs assistance Sitting-balance support: Single extremity supported, Feet supported Sitting balance-Leahy Scale: Poor Sitting balance - Comments: needed mod A to sit due to strong posterior lean. Progressed to min with work on reaching fwd activities. Pt motivated by wanting her gingerale cup. Postural control: Posterior lean Standing balance support: Bilateral upper extremity supported, During functional activity Standing balance-Leahy Scale: Zero Standing balance comment: max A needed to maintain standing                            Cognition Arousal: Alert Behavior During Therapy: Flat affect  Overall Cognitive Status: No family/caregiver present to determine baseline cognitive functioning                                 General Comments: pt following one step commands constently. Engaging in conversation about her childhood but cannot relay recent info.        Exercises      General Comments General comments (skin integrity, edema, etc.): VSS on RA. Psych present end of session. Pt frustrated by not being able  to eat yet      Pertinent Vitals/Pain      Home Living                          Prior Function            PT Goals (current goals can now be found in the care plan section) Acute Rehab PT Goals Patient Stated Goal: to get up PT Goal Formulation: With patient Time For Goal Achievement: 07/05/23 Potential to Achieve Goals: Fair Progress towards PT goals: Progressing toward goals    Frequency    Min 1X/week      PT Plan      Co-evaluation              AM-PAC PT 6 Clicks Mobility   Outcome Measure  Help needed turning from your back to your side while in a flat bed without using bedrails?: Total Help needed moving from lying on your back to sitting on the side of a flat bed without using bedrails?: Total Help needed moving to and from a bed to a chair (including a wheelchair)?: Total Help needed standing up from a chair using your arms (e.g., wheelchair or bedside chair)?: Total Help needed to walk in hospital room?: Total Help needed climbing 3-5 steps with a railing? : Total 6 Click Score: 6    End of Session Equipment Utilized During Treatment: Gait belt Activity Tolerance: Patient tolerated treatment well Patient left: in bed;with call bell/phone within reach;with bed alarm set (linens and gown changed) Nurse Communication: Mobility status;Need for lift equipment (stedy) PT Visit Diagnosis: Unsteadiness on feet (R26.81);Muscle weakness (generalized) (M62.81)     Time: 8791-8766 PT Time Calculation (min) (ACUTE ONLY): 25 min  Charges:    $Therapeutic Activity: 23-37 mins PT General Charges $$ ACUTE PT VISIT: 1 Visit                     Richerd Lipoma, PT  Acute Rehab Services Secure chat preferred Office [phone]    Shyasia Funches L Stephonie Wilcoxen 06/22/2023, 1:30 PM

## 2023-06-23 ENCOUNTER — Inpatient Hospital Stay (HOSPITAL_COMMUNITY): Payer: Medicare PPO

## 2023-06-23 DIAGNOSIS — G9341 Metabolic encephalopathy: Secondary | ICD-10-CM

## 2023-06-23 DIAGNOSIS — F2089 Other schizophrenia: Secondary | ICD-10-CM | POA: Diagnosis not present

## 2023-06-23 DIAGNOSIS — G2119 Other drug induced secondary parkinsonism: Secondary | ICD-10-CM | POA: Diagnosis not present

## 2023-06-23 DIAGNOSIS — F79 Unspecified intellectual disabilities: Secondary | ICD-10-CM | POA: Diagnosis not present

## 2023-06-23 DIAGNOSIS — T43505A Adverse effect of unspecified antipsychotics and neuroleptics, initial encounter: Secondary | ICD-10-CM | POA: Diagnosis not present

## 2023-06-23 LAB — BASIC METABOLIC PANEL
Anion gap: 11 (ref 5–15)
BUN: 11 mg/dL (ref 8–23)
CO2: 26 mmol/L (ref 22–32)
Calcium: 8.4 mg/dL — ABNORMAL LOW (ref 8.9–10.3)
Chloride: 100 mmol/L (ref 98–111)
Creatinine, Ser: 0.81 mg/dL (ref 0.44–1.00)
GFR, Estimated: >60 mL/min (ref >60)
Glucose, Bld: 122 mg/dL — ABNORMAL HIGH (ref 70–99)
Potassium: 3.4 mmol/L — ABNORMAL LOW (ref 3.5–5.1)
Sodium: 137 mmol/L (ref 135–145)

## 2023-06-23 LAB — MAGNESIUM: Magnesium: 1.8 mg/dL (ref 1.7–2.4)

## 2023-06-23 LAB — CBC
HCT: 35 % — ABNORMAL LOW (ref 36.0–46.0)
Hemoglobin: 11.3 g/dL — ABNORMAL LOW (ref 12.0–15.0)
MCH: 28.3 pg (ref 26.0–34.0)
MCHC: 32.3 g/dL (ref 30.0–36.0)
MCV: 87.5 fL (ref 80.0–100.0)
Platelets: 144 10*3/uL — ABNORMAL LOW (ref 150–400)
RBC: 4 MIL/uL (ref 3.87–5.11)
RDW: 14.2 % (ref 11.5–15.5)
WBC: 3.3 10*3/uL — ABNORMAL LOW (ref 4.0–10.5)
nRBC: 0 % (ref 0.0–0.2)

## 2023-06-23 LAB — GLUCOSE, CAPILLARY
Glucose-Capillary: 128 mg/dL — ABNORMAL HIGH (ref 70–99)
Glucose-Capillary: 121 mg/dL — ABNORMAL HIGH (ref 70–99)
Glucose-Capillary: 33 mg/dL — CL (ref 70–99)
Glucose-Capillary: 165 mg/dL — ABNORMAL HIGH (ref 70–99)
Glucose-Capillary: 102 mg/dL — ABNORMAL HIGH (ref 70–99)

## 2023-06-23 MED ORDER — SODIUM CHLORIDE 0.9 % IV BOLUS
500.0000 mL | INTRAVENOUS | Status: AC
  Administered 2023-06-23: 500 mL via INTRAVENOUS

## 2023-06-23 MED ORDER — QUETIAPINE FUMARATE 25 MG PO TABS
25.0000 mg | ORAL_TABLET | Freq: Two times a day (BID) | ORAL | Status: DC
  Administered 2023-06-23 – 2023-06-25 (×4): 25 mg via ORAL
  Filled 2023-06-23 (×4): qty 1

## 2023-06-23 MED ORDER — DIPHENHYDRAMINE HCL 25 MG PO CAPS
25.0000 mg | ORAL_CAPSULE | Freq: Two times a day (BID) | ORAL | Status: DC | PRN
  Administered 2023-06-23 – 2023-06-26 (×5): 25 mg via ORAL
  Filled 2023-06-23 (×6): qty 1

## 2023-06-23 MED ORDER — QUETIAPINE FUMARATE ER 50 MG PO TB24
50.0000 mg | ORAL_TABLET | Freq: Every day | ORAL | Status: DC
Start: 2023-06-23 — End: 2023-06-25
  Administered 2023-06-23 – 2023-06-24 (×2): 50 mg via ORAL
  Filled 2023-06-23 (×2): qty 1

## 2023-06-23 MED ORDER — CLONAZEPAM 0.5 MG PO TABS
0.5000 mg | ORAL_TABLET | Freq: Two times a day (BID) | ORAL | Status: DC | PRN
  Administered 2023-06-23 – 2023-06-28 (×6): 0.5 mg via ORAL
  Filled 2023-06-23 (×6): qty 1

## 2023-06-23 MED ORDER — FUROSEMIDE 10 MG/ML IJ SOLN
40.0000 mg | Freq: Once | INTRAMUSCULAR | Status: AC
  Administered 2023-06-23: 40 mg via INTRAVENOUS
  Filled 2023-06-23: qty 4

## 2023-06-23 NOTE — Progress Notes (Signed)
  Progress Note   Patient: Annette Martin FMW:[id number] DOB: 30-Jan-1956 DOA: 06/20/2023     2 DOS: the patient was seen and examined on 06/23/2023   Brief hospital course: No notes on file ELIANE HAMMERSMITH is a 68 y.o. female with hx of schizophrenia, diabetes, hyperlipidemia, hypothyroidism, urinary incontinence, who was brought in from [facility] for altered mental status.  She was admitted for acute metabolic encephalopathy in the setting of influenza A infection as well as some dehydration.  She is noted to have some bradykinesia with cogwheel rigidity and resting tremor suspicious for EPS.  EEG performed with results pending.  Psychiatry consulted for assistance in management of medications.  PT recommending SNF.   Assessment and Plan: No notes have been filed under this hospital service. Service: Hospitalist  Encephalopathy acute, metabolic with bradykinesia and resting tremor -Suspect her encephalopathy is multifactorial related to her influenza A infection, dehydration, medication effects.  Appreciate psychiatry for adjustment of antipsychotic medications.  Pt appears to be improving.    Acute Influenza A  - Pt still feeling maliase.  Requiring 2L [place] currently.  Continue Tamiflu  75 mg twice daily for 5 days.  Albuterol  prn, incentive spirometer.  Supportive care.  Given IV fluid hydration, will attempt lasix  x 1.     Dehydration -Appears to be resolving with IV fluid hydration.  Encourage PO intake.   Deconditioning -PT evaluation recommending SNF   History of schizophrenia/intellectual disability/depression -Appreciate evaluation by psychiatry.  Will heed dose reduction of sedative medications per their recommendation.  Diabetes: Home regimen is metformin .  Glucose within goal range can add SSI if runs high. Neuropathy: Gabapentin  resumed HLD: Continue home atorvastatin  Hypothyroidism: TSH 3.4 Urinary incontinence: Continue home bethanechol  3 times daily and fesoteridine  nightly GERD: home PPI      Subjective: Pt resting comfortably, however complaining she still does not feel well.  Admits to overall malaise and nausea.  Continues on 2L [place].  Denies fever, worsening shortness of breath, vomiting, chest pain, abdominal pain.    Physical Exam: Vitals:   06/22/23 2300 06/23/23 0525 06/23/23 0636 06/23/23 0810  BP: (!) 112/43 (!) 107/48  130/62  Pulse: 70 74  70  Resp: 18 18  18   Temp: 97.6 F (36.4 C) 97.6 F (36.4 C)  98 F (36.7 C)  TempSrc: Oral Oral    SpO2: 98% 99% 96% 98%   Physical Exam GENERAL:  Alert, pleasant, malaise  HEENT:  EOMI CARDIOVASCULAR:  RRR, no murmurs appreciated RESPIRATORY:  Clear to auscultation, poor air movement GASTROINTESTINAL:  Soft, nontender, nondistended EXTREMITIES:  No LE edema bilaterally NEURO:  No new focal deficits appreciated SKIN:  No rashes noted PSYCH:  flat mood and affect   Data Reviewed:  There are no new results to review at this time.  Family Communication:   Disposition: Status is: Inpatient Remains inpatient appropriate because: Ongoing hypoxia from influenza  Planned Discharge Destination: Skilled nursing facility    Time spent: 40 minutes  Author: Carliss LELON Canales, DO 06/23/2023 12:19 PM  For on call review [email or web address].

## 2023-06-23 NOTE — Consult Note (Signed)
 [facility] Psychiatric Consult Initial  Patient Name: .Annette Martin  MRN: [id number]  DOB: 1956-03-28  Consult Order details:  Orders (From admission, onward)     Start     Ordered   06/22/23 0002  IP CONSULT TO PSYCHIATRY       Ordering Provider: Sundil, Subrina, MD  Provider:  (Not yet assigned)  Question Answer Comment  Location [facility]   Reason for Consult? Extrapyramidal syndrome side effect of Haldol .  Need medication readjustment.      06/22/23 0001             Mode of Visit: In person    Psychiatry Consult Evaluation  Service Date: June 23, 2023 LOS:  LOS: 2 days  Chief Complaint: Altered Mental Status from Flu  Primary Psychiatric Diagnoses  Drug-induced parkinsonism, extra pyramidal symptoms 2.  Schizophrenia 3.  Intellectual Disability 4.  Behavioral problems  Assessment  Annette Martin is a 68 y.o. female admitted: Medically for 06/20/2023  7:02 PM for encephalopathy. She carries the psychiatric diagnoses of schizophrenia, intellectual disability, depression and has a past medical history of  thyroid disease, hypertension, T2DM, Rhabdomyolisis, Vitamin D deficient osteomalacia,.   Her current presentation of altered mental status, mixed rigidity, asymmetric tremor, pill rolling behavior and diaphoresis is most consistent with drug-induced parkinsonism in the setting of antipsychotic use She meets criteria for drug-induced parkinsonism based on history by patient, chart-reviewed history, physical presentation. Current outpatient psychotropic medications include haloperidol  5 mg TID, depakote  250 DR TID, gabapentin  300 mg TID, amantadine  100 mg BID, clonazepam  0.5 mg TID, and diphenhydramine  25 mg TID and historically she has had a modest response to these medications. She was compliant with medications prior to admission as evidenced by facility records. On initial examination, patient demonstrated good self control, but in subsequent  interviews has shown low frustration tolerance and emotional lability. Her life circumstances are challenging and it is regrettable that there is not more that we can offer her.  Please see plan below for detailed recommendations. We will continue to follow.  Diagnoses:  Active Hospital problems: Principal Problem:   Encephalopathy Active Problems:   Influenza A   Dehydration    Plan   ## Psychiatric Medication Recommendations:  -- Reduce clonazepam  0.5 mg TID PRN to BID PRN -- Reduce diphenhydramine  25 mg TID PRN to BID PRN -- Continue sertraline  200 mg at bedtime  -- Continue divalproex  250 mg TID  -- Continue gabapentin  300 mg TID -- Continue amantadine  100 mg BID -- Hold haldol  except for extreme agitation  Patient is on a high number of medications with possible psychotropic effects that can also cause delirium or her parkinsonism-like symptoms. She would benefit from a significant streamlining of her outpatient medications. For now, the haldol  seems to be the most likely culprit for her extrapyramidal symptoms. Patient would be better served with a second generation antipsychotic. Could also consider reducing the zoloft  with the treatment of the quetiapine .  -- Continue quetiapine  50 mg at bedtime for schizophrenia,  -- Start quetiapine  25 mg BID (0800 and 1400) for schizophrenia Plan to eventually increase upwards in 25 mg increments to targeted daily dosage of 150 mg nightly to control schizophrenia symptoms   ## Medical Decision Making Capacity: Patient has a guardian and has thus been adjudicated incompetent; please involve patients guardian in medical decision making Patient's primary guardian is in jail.  ## Further Work-up:  -- Added on Creatinine Kinase to previous collection. While pt  on Qtc prolonging medications, please monitor & replete K+ to 4 and Mg2+ to 2 -- most recent EKG on 06/20/2023 had QtC of 443 ms -- Pertinent labwork reviewed earlier this admission  includes: venous blood gases, CMP, CBC, respiratory panel, UA   ## Disposition:-- There are no psychiatric contraindications to discharge at this time  ## Behavioral / Environmental: -Delirium Precautions: Delirium Interventions for Nursing and Staff: - RN to open blinds every AM. - To Bedside: Glasses, hearing aide, and pt's own shoes. Make available to patients. when possible and encourage use. - Encourage po fluids when appropriate, keep fluids within reach. - OOB to chair with meals. - Passive ROM exercises to all extremities with AM & PM care. - RN to assess orientation to person, time and place QAM and PRN. - Recommend extended visitation hours with familiar family/friends as feasible. - Staff to minimize disturbances at night. Turn off television when pt asleep or when not in use.    ## Safety and Observation Level:  - Based on my clinical evaluation, I estimate the patient to be at low risk of self harm in the current setting. - At this time, we recommend  routine. This decision is based on my review of the chart including patient's history and current presentation, interview of the patient, mental status examination, and consideration of suicide risk including evaluating suicidal ideation, plan, intent, suicidal or self-harm behaviors, risk factors, and protective factors. This judgment is based on our ability to directly address suicide risk, implement suicide prevention strategies, and develop a safety plan while the patient is in the clinical setting. Please contact our team if there is a concern that risk level has changed.  CSSR Risk Category:   Suicide Risk Assessment: Patient has following modifiable risk factors for suicide: none which we are addressing by continuing with planned management. Patient has following non-modifiable or demographic risk factors for suicide: psychiatric hospitalization Patient has the following protective factors against suicide: Access to outpatient  mental health care  Thank you for this consult request. Recommendations have been communicated to the primary team.  We will follow at this time.   Lynwood Morene Lavone Delsie, MD PGY-1       History of Present Illness  Relevant Aspects of [facility] Course:  Admitted on 06/20/2023 for encephalopathy in the setting of Influenza A. They are currently inpatient being treated for the flu, when team noticed increased rigidity, diaphoresis, and increased tremor.   Patient Report:  Met patient bedside. The patient was crying out saying that her back pain is terrible. She had also had her purewick fall out, which she was also very upset about. The patient seems to have few coping mechanisms and low frustration tolerance. There is not much that we can fix from a psychiatry standpoint while inpatient, but she would benefit from cognitive behavioral therapy in the outpatient setting.    Psych ROS:  Depression: denied symptoms of depression other than sleep changes and very recent poor mood due to illness Anxiety:  Denies Mania (lifetime and current): Denies Psychosis: (lifetime and current): Did not understand question.  Collateral information:  Pt has legal guardian, but he is currently in prison.   Will consult facility closer to discharge.  Collateral information obtained Charolotte Jules, patient's cousin) Patient granted permission to speak to contact person without restrictions.  Reported that this was the fourth call he has had about his cousin. Has not seen her in over a year. Reported that he last saw  her moving like she was doing the thorazine shuffle, indicating that some of this parkinsonism might go back prior to this admission.  Collateral contact denies presence of firearms or large stockpiles of pills at home.   During this conversation, I explained in simple terms the patient's mental health condition, answered questions pertaining to the patient's current treatment and  provided updates, and outlined the treatment plan moving forward.  Lynwood Morene Lavone Delsie, MD 06/23/2023 8:48 AM  Review of Systems  Constitutional:  Positive for chills, diaphoresis, fever and malaise/fatigue. Negative for weight loss.  HENT:  Negative for hearing loss.   Respiratory:  Positive for cough, sputum production and shortness of breath.   Cardiovascular:  Negative for chest pain.  Gastrointestinal:  Positive for abdominal pain and constipation.  Musculoskeletal:  Positive for myalgias.  Neurological:  Positive for weakness and headaches.  Psychiatric/Behavioral:  Positive for depression. Negative for hallucinations, memory loss, substance abuse and suicidal ideas. The patient is nervous/anxious. The patient does not have insomnia.      Psychiatric and Social History  Psychiatric History:  Information collected from chart   Prev Dx/Sx: Schizophrenia, IDD. Current Psych Provider: [facility] Meds (current):  Current Outpatient Medications  Medication Instructions  . amantadine  (SYMMETREL ) 100 mg, Oral, 2 times daily  . atorvastatin  (LIPITOR) 10 mg, Oral, Daily at bedtime  . bethanechol  (URECHOLINE ) 25 mg, Oral, 3 times daily  . clonazePAM  (KLONOPIN ) 0.5 mg, Oral, 3 times daily  . divalproex  (DEPAKOTE ) 250 mg, Oral, 3 times daily  . docusate sodium  (COLACE) 200 mg, Oral, 2 times daily  . ferrous sulfate  325 mg, Oral, Daily with breakfast  . fesoterodine  (TOVIAZ ) 4 mg, Oral, Daily at bedtime  . gabapentin  (NEURONTIN ) 300 mg, Oral, 3 times daily  . haloperidol  (HALDOL ) 5 mg, Oral, 2 times daily  . lactulose  (CHRONULAC ) 20 g, Oral, Daily  . lidocaine  (LIDODERM ) 5 % 1 patch, Transdermal, Every 24 hours, Remove & Discard patch within 12 hours or as directed by MD  . losartan  (COZAAR ) 25 mg, Oral, Daily  . Magnesium  400 MG CAPS 1 tablet, Oral, 2 times daily  . meclizine (ANTIVERT) 25 mg, 3 times daily PRN  . meloxicam (MOBIC) 7.5 mg, Oral, Daily  . metFORMIN   (GLUCOPHAGE ) 1,000 mg, Oral, 2 times daily with meals  . ondansetron  (ZOFRAN ) 4 mg, Every 6 hours PRN  . pantoprazole  (PROTONIX ) 40 mg, Oral, 2 times daily  . sertraline  (ZOLOFT ) 200 mg, Oral, Daily at bedtime  Previous Med Trials: ziprasidone , risperidone , hydroxyzine , quetiapine , temazepam , valbenazine  tosylate Therapy: none  Prior Psych Hospitalization: 2019  Prior Self Harm: Unable to assess. Prior Violence: Unable to assess.  Family Psych History: No history on file, Unable to assess from patient reports. Family Hx suicide: No history on file, Unable to assess from patient reports.  Social History:  Developmental Hx: Intellectually disabled. Educational Hx: No history on file, Unable to assess from patient reports. Occupational Hx: disabled Legal Hx: chart review does not provide legal history, patient not able to communicate. Living Situation: Patient lives in assisted living facility [facility]  [address]  [facility] [place] [id number]  Spiritual Hx: no Access to weapons/lethal means: Denies (per cousin, Alliene Klugh)   Substance History Alcohol: Denies  Type of alcohol Denies  Last Drink Denies  Number of drinks per day Denies  Tobacco: Denies  Illicit drugs: Denies  Prescription drug abuse: Denies  Rehab hx: Denies   Exam Findings  Physical Exam:  Vital Signs:  Temp:  [97.6 F (36.4 C)-98.7 F (37.1 C)] 98 F (36.7 C) (02/05 0810) Pulse Rate:  [70-80] 70 (02/05 0810) Resp:  [18] 18 (02/05 0810) BP: (92-139)/(42-62) 130/62 (02/05 0810) SpO2:  [90 %-99 %] 98 % (02/05 0810) Blood pressure 130/62, pulse 70, temperature 98 F (36.7 C), resp. rate 18, SpO2 98%. There is no height or weight on file to calculate BMI.  Physical Exam Vitals and nursing note reviewed.  Constitutional:      Appearance: She is ill-appearing and diaphoretic.  HENT:     Head: Normocephalic and atraumatic.     Nose: Congestion present.  Pulmonary:     Breath sounds: Rales  present.  Abdominal:     Palpations: Abdomen is soft.  Skin:    General: Skin is warm.  Neurological:     Mental Status: She is alert. Mental status is at baseline.     Gait: Gait abnormal.  Psychiatric:        Attention and Perception: Attention and perception normal.        Mood and Affect: Mood is depressed.        Speech: Speech normal.        Behavior: Behavior is cooperative.        Thought Content: Thought content is not paranoid or delusional. Thought content does not include homicidal or suicidal ideation.        Cognition and Memory: Memory normal. Cognition is impaired.        Judgment: Judgment normal.     Mental Status Exam: General Appearance: Disheveled  Orientation:  Other:  Name, place  Memory:  Immediate;   Good Remote;   Fair  Concentration:  Concentration: Good  Recall:  NA  Attention  Good  Eye Contact:  Good  Speech:  Slow and Slurred  Language:  Fair  Volume:  Decreased  Mood: I don't feel great  Affect:  Appropriate  Thought Process:  Coherent and Linear  Thought Content:  WDL  Suicidal Thoughts:  No  Homicidal Thoughts:  No  Judgement:  Intact  Insight:  Shallow  Psychomotor Activity:  EPS, Increased, Mannerisms, Restlessness, and Tremor  Akathisia:  Yes  Fund of Knowledge:  Fair      Assets:  Desire for Improvement Leisure Time Vocational/Educational  Cognition:  Impaired,  Mild  ADL's:  Impaired  AIMS (if indicated):        Other History   These have been pulled in through the EMR, reviewed, and updated if appropriate.  Family History:  The patient's family history is not on file.  Medical History: Past Medical History:  Diagnosis Date  . Depression   . Diabetes mellitus    type 2  . GERD (gastroesophageal reflux disease)   . Hypertension   . Hypoxemia   . Mental retardation   . Neuroleptic malignant syndrome   . Rhabdomyolysis   . Schizophrenia (HCC)   . Thyroid disease   . Vitamin D deficient osteomalacia      Surgical History: Past Surgical History:  Procedure Laterality Date  . COLONOSCOPY  2010   MANN     Medications:   Current Facility-Administered Medications:  .  acetaminophen  (TYLENOL ) tablet 1,000 mg, 1,000 mg, Oral, Q6H PRN, Segars, Jonathan, MD, 1,000 mg at 06/22/23 2139 .  albuterol  (PROVENTIL ) (2.5 MG/3ML) 0.083% nebulizer solution 2.5 mg, 2.5 mg, Nebulization, Q4H PRN, Sundil, Subrina, MD, 2.5 mg at 06/22/23 2135 .  amantadine  (SYMMETREL ) capsule 100 mg, 100 mg, Oral, BID, Segars, Dorn,  MD, 100 mg at 06/22/23 2140 .  atorvastatin  (LIPITOR) tablet 10 mg, 10 mg, Oral, QHS, Segars, Dorn, MD, 10 mg at 06/22/23 2139 .  bethanechol  (URECHOLINE ) tablet 25 mg, 25 mg, Oral, TID, Segars, Dorn, MD, 25 mg at 06/22/23 2140 .  clonazePAM  (KLONOPIN ) tablet 0.5 mg, 0.5 mg, Oral, TID PRN, Segars, Jonathan, MD, 0.5 mg at 06/22/23 1617 .  diphenhydrAMINE  (BENADRYL ) capsule 25 mg, 25 mg, Oral, Q8H PRN, Maree, Pratik D, DO, 25 mg at 06/22/23 2140 .  divalproex  (DEPAKOTE ) DR tablet 250 mg, 250 mg, Oral, TID, Segars, Jonathan, MD, 250 mg at 06/22/23 2140 .  docusate sodium  (COLACE) capsule 100 mg, 100 mg, Oral, BID, Segars, Dorn, MD, 100 mg at 06/22/23 2140 .  ferrous sulfate  tablet 325 mg, 325 mg, Oral, Q breakfast, Segars, Dorn, MD, 325 mg at 06/22/23 0840 .  fesoterodine  (TOVIAZ ) tablet 4 mg, 4 mg, Oral, QHS, Segars, Dorn, MD, 4 mg at 06/22/23 2140 .  gabapentin  (NEURONTIN ) capsule 300 mg, 300 mg, Oral, TID, Maree, Pratik D, DO, 300 mg at 06/22/23 1617 .  insulin  aspart (novoLOG ) injection 0-5 Units, 0-5 Units, Subcutaneous, QHS, Sundil, Subrina, MD .  insulin  aspart (novoLOG ) injection 0-6 Units, 0-6 Units, Subcutaneous, TID WC, Sundil, Subrina, MD .  lactulose  (CHRONULAC ) 10 GM/15ML solution 10 g, 10 g, Oral, Daily PRN, Segars, Jonathan, MD .  LORazepam  (ATIVAN ) injection 2 mg, 2 mg, Intravenous, Q4H PRN, Maree, Pratik D, DO, 2 mg at 06/21/23 1558 .  melatonin tablet 6 mg,  6 mg, Oral, QHS PRN, Segars, Jonathan, MD .  ondansetron  (ZOFRAN ) injection 4 mg, 4 mg, Intravenous, Q6H PRN, Segars, Dorn, MD .  oseltamivir  (TAMIFLU ) capsule 75 mg, 75 mg, Oral, BID, Segars, Dorn, MD, 75 mg at 06/22/23 2141 .  pantoprazole  (PROTONIX ) EC tablet 40 mg, 40 mg, Oral, BID, Segars, Jonathan, MD, 40 mg at 06/22/23 2140 .  QUEtiapine  (SEROQUEL  XR) 24 hr tablet 50 mg, 50 mg, Oral, QHS, Luca Burston, Lynwood Morene Deems, MD, 50 mg at 06/22/23 2140 .  sertraline  (ZOLOFT ) tablet 200 mg, 200 mg, Oral, QHS, Segars, Jonathan, MD, 200 mg at 06/22/23 2139  Allergies: No Known Allergies  Lynwood Morene Deems Delsie, MD

## 2023-06-23 NOTE — Plan of Care (Signed)
  Problem: Coping: Goal: Ability to adjust to condition or change in health will improve Outcome: Not Progressing Note: Patient having intermittent anxiety. Supported patient emotionally and physically. Patient and bed linens have to be changed often due to pulling purewick off.   She seems to be more at ease when nurse or tech is in room with her. Explained to patient that we are unable to stay in the room for extended periods of time. Medication given to patient to ease anxiety.(See Mar). However, after leaving the room she cries out shortly after.    Problem: Fluid Volume: Goal: Ability to maintain a balanced intake and output will improve Outcome: Progressing   Problem: Metabolic: Goal: Ability to maintain appropriate glucose levels will improve Outcome: Progressing   Problem: Nutritional: Goal: Maintenance of adequate nutrition will improve Outcome: Progressing   Problem: Skin Integrity: Goal: Risk for impaired skin integrity will decrease Outcome: Progressing   Problem: Clinical Measurements: Goal: Ability to maintain clinical measurements within normal limits will improve Outcome: Progressing Goal: Respiratory complications will improve Outcome: Progressing

## 2023-06-23 NOTE — Progress Notes (Signed)
 Patient has not voided, patient denies urge to void. Bladder scan 360 mls. Notified provider. See orders. Straight cath x1 for 600 mls

## 2023-06-23 NOTE — TOC CM/SW Note (Addendum)
 RE: Annette Martin. [name] Date of Birth: [birth year] Date: 06/23/2023  Please be advised that the above-named patient will require a short-term nursing home stay - anticipated 30 days or less for rehabilitation and strengthening.  The plan is for return home.   Lauraine Saa, MSW, LCSW-A Transitions of Care  Clinical Social Worker I [phone]

## 2023-06-23 NOTE — Procedures (Signed)
 Modified Barium Swallow Study  Patient Details  Name: Annette Martin MRN: [id number] Date of Birth: [birth year]  Today's Date: 06/23/2023  Modified Barium Swallow completed.  Full report located under Chart Review in the Imaging Section.  History of Present Illness Patient is a 68 y.o. female with PMH: schizophrenia, DM, HLD, hypothyroidism, urinary incontinence who was brought to hospital from [facility] for AMS on 06/20/23 In ED, she was somnolent, CXR did not show evidence of active pulmonary disease, CT head and MRI brain both negatie for acute intracranial abnormality. SLP swallow evaluation ordered secondary to patient with excessive coughing with PO intake.   Clinical Impression Patient presents with a primary oral phase dysphagia with secondary pharyngoesophageal phase dysphagia. No aspiration observed with any of the tested barium consistencies during any phase of the swallow.  During oral phase, she exhibited decreased efficiency of mastication with solids and decreased anterior to posterior transit of puree and mechanical soft solids as well as honey thick liquids. Following initial swallows, residuals remained on lingual surface, tongue base and vallecular sinus. PO residual increase correlated with viscosity increase. Cued and uncued subsequent swallows did fully clear oral and pharyngeal residuals. Swallow was initiated at level of the vallecular sinus with solids and honey thick liquids but initiated at level of pyriform sinus with nectar thick and thin liquids. Flash penetration above vocal cords (PAS 2) occurred with both thin and nectar thick liquids. 13 mm barium tablet transited through PES and esophagus without difficulty. Esophageal sweep revealed retrograde flow of barium in distal esophagus and mild amount of barium stasis. SLP suspects that patient's dysphagia is chronic in nature and primary aspiration risk is from her inefficient mastication of solids, impulsive, rapid PO  intake, and h/o GERD.  Factors that may increase risk of adverse event in presence of aspiration Noe & Lianne 2021): Limited mobility;Dependence for feeding and/or oral hygiene;Reduced cognitive function  Swallow Evaluation Recommendations Recommendations: PO diet PO Diet Recommendation: Dysphagia 3 (Mechanical soft);Thin liquids (Level 0) Liquid Administration via: Cup;Straw Medication Administration: Whole meds with liquid Supervision: Full supervision/cueing for swallowing strategies;Full assist for feeding Swallowing strategies  : Small bites/sips;Slow rate;Follow solids with liquids Postural changes: Position pt fully upright for meals Oral care recommendations: Oral care BID (2x/day);Staff/trained caregiver to provide oral care     [name] IVAR Blase, MA, CCC-SLP Speech Therapy

## 2023-06-23 NOTE — TOC Progression Note (Addendum)
 Transition of Care [facility]) - Progression Note    Patient Details  Name: Annette Martin MRN: [id number] Date of Birth: 1955-09-29  Transition of Care [facility]) CM/SW Contact  Lauraine FORBES Saa, LCSW Phone Number: 06/23/2023, 2:34 PM  Clinical Narrative:     2:34 PM CSW spoke with patient's legal guardian/brother [phone]), Annette Martin, who agreed with physical therapy recommendation of patient discharge to SNF. Annette Martin expressed preference in SNFs in [facility] but is interested in SNFs in [place], as well. Annette Martin expressed interest in patient to stay in SNF without copay.  4:00 PM PASRR pending per clinical review.    Barriers to Discharge: Continued Medical Work up  Expected Discharge Plan and Services       Living arrangements for the past 2 months: Assisted Living Facility                                       Social Determinants of Health (SDOH) Interventions SDOH Screenings   Food Insecurity: Food Insecurity Present (02/15/2023)  Housing: Low Risk  (02/15/2023)  Transportation Needs: No Transportation Needs (02/15/2023)  Utilities: Not At Risk (02/15/2023)  Alcohol Screen: Low Risk  (04/05/2018)  Tobacco Use: Low Risk  (06/12/2023)    Readmission Risk Interventions     No data to display

## 2023-06-23 NOTE — NC FL2 (Signed)
 [place]  MEDICAID FL2 LEVEL OF CARE FORM     IDENTIFICATION  Patient Name: Annette Martin Birthdate: [birth year] Sex: female Admission Date (Current Location): 06/20/2023  [facility] and [place] Number:  [employer] and Address:  The . [facility], [address] [place], [place] [address]      Provider Number: [id number]  Attending Physician Name and Address:  Arlon Carliss ORN, DO  Relative Name and Phone Number:  Lynwood Dale Jules, Legal Guardian/Brother, [phone]; Alm Jules, [place], [phone]    Current Level of Care: Hospital Recommended Level of Care: Skilled Nursing Facility Prior Approval Number:    Date Approved/Denied:   PASRR Number:    Discharge Plan: SNF    Current Diagnoses: Patient Active Problem List   Diagnosis Date Noted   Acute metabolic encephalopathy 06/23/2023   Influenza A 06/21/2023   Dehydration 06/21/2023   Encephalopathy 06/20/2023   Tremor 02/19/2023   Hypertension 02/16/2023   Anemia 02/15/2023   Acute left-sided weakness 02/14/2023   Schizophrenia (HCC) 04/05/2018   Intellectual disability 12/05/2012   Diabetes mellitus (HCC) 05/28/2011   Hypothyroidism 05/28/2011   Hyperlipidemia 05/28/2011    Orientation RESPIRATION BLADDER Height & Weight     Place, Self  Normal Incontinent, External catheter Weight:   Height:     BEHAVIORAL SYMPTOMS/MOOD NEUROLOGICAL BOWEL NUTRITION STATUS      Continent Diet (Please see dc summary)  AMBULATORY STATUS COMMUNICATION OF NEEDS Skin   Extensive Assist Verbally Normal                       Personal Care Assistance Level of Assistance  Bathing, Feeding, Dressing Bathing Assistance: Maximum assistance Feeding assistance: Maximum assistance Dressing Assistance: Maximum assistance     Functional Limitations Info             SPECIAL CARE FACTORS FREQUENCY  PT (By licensed PT), OT (By licensed OT), Speech therapy     PT Frequency:  5x OT Frequency: 5x     Speech Therapy Frequency: 5x      Contractures Contractures Info: Not present    Additional Factors Info  Code Status, Allergies, Psychotropic, Insulin  Sliding Scale Code Status Info: Full Code Allergies Info: NKA Psychotropic Info: Please see dc summary Insulin  Sliding Scale Info: Please see dc summary       Current Medications (06/23/2023):  This is the current hospital active medication list Current Facility-Administered Medications  Medication Dose Route Frequency Provider Last Rate Last Admin   acetaminophen  (TYLENOL ) tablet 1,000 mg  1,000 mg Oral Q6H PRN Keturah Carrier, MD   1,000 mg at 06/22/23 2139   albuterol  (PROVENTIL ) (2.5 MG/3ML) 0.083% nebulizer solution 2.5 mg  2.5 mg Nebulization Q4H PRN Sundil, Subrina, MD   2.5 mg at 06/22/23 2135   amantadine  (SYMMETREL ) capsule 100 mg  100 mg Oral BID Segars, Jonathan, MD   100 mg at 06/23/23 9093   atorvastatin  (LIPITOR) tablet 10 mg  10 mg Oral QHS Segars, Jonathan, MD   10 mg at 06/22/23 2139   bethanechol  (URECHOLINE ) tablet 25 mg  25 mg Oral TID Segars, Jonathan, MD   25 mg at 06/23/23 9093   clonazePAM  (KLONOPIN ) tablet 0.5 mg  0.5 mg Oral BID PRN Arlon Carliss ORN, DO   0.5 mg at 06/23/23 1352   diphenhydrAMINE  (BENADRYL ) capsule 25 mg  25 mg Oral BID PRN Arlon Carliss ORN, DO   25 mg at 06/23/23 1352   divalproex  (DEPAKOTE ) DR tablet  250 mg  250 mg Oral TID Segars, Jonathan, MD   250 mg at 06/23/23 9093   docusate sodium  (COLACE) capsule 100 mg  100 mg Oral BID Segars, Jonathan, MD   100 mg at 06/23/23 9093   ferrous sulfate  tablet 325 mg  325 mg Oral Q breakfast Segars, Jonathan, MD   325 mg at 06/23/23 9093   fesoterodine  (TOVIAZ ) tablet 4 mg  4 mg Oral QHS Segars, Jonathan, MD   4 mg at 06/22/23 2140   gabapentin  (NEURONTIN ) capsule 300 mg  300 mg Oral TID Shah, Pratik D, DO   300 mg at 06/23/23 9093   insulin  aspart (novoLOG ) injection 0-5 Units  0-5 Units Subcutaneous QHS Sundil, Subrina, MD        insulin  aspart (novoLOG ) injection 0-6 Units  0-6 Units Subcutaneous TID WC Sundil, Subrina, MD       lactulose  (CHRONULAC ) 10 GM/15ML solution 10 g  10 g Oral Daily PRN Segars, Jonathan, MD       LORazepam  (ATIVAN ) injection 2 mg  2 mg Intravenous Q4H PRN Arlon Carliss ORN, DO   2 mg at 06/21/23 1558   melatonin tablet 6 mg  6 mg Oral QHS PRN Keturah Carrier, MD       ondansetron  (ZOFRAN ) injection 4 mg  4 mg Intravenous Q6H PRN Segars, Carrier, MD       oseltamivir  (TAMIFLU ) capsule 75 mg  75 mg Oral BID Segars, Jonathan, MD   75 mg at 06/23/23 9093   pantoprazole  (PROTONIX ) EC tablet 40 mg  40 mg Oral BID Segars, Jonathan, MD   40 mg at 06/23/23 9093   QUEtiapine  (SEROQUEL  XR) 24 hr tablet 50 mg  50 mg Oral QHS [name], MD       And   QUEtiapine  (SEROQUEL ) tablet 25 mg  25 mg Oral BID [name], MD   25 mg at 06/23/23 1229   sertraline  (ZOLOFT ) tablet 200 mg  200 mg Oral QHS Segars, Jonathan, MD   200 mg at 06/22/23 2139     Discharge Medications: Please see discharge summary for a list of discharge medications.  Relevant Imaging Results:  Relevant Lab Results:   Additional Information SS#: [id number]  Lauraine FORBES Saa, LCSW

## 2023-06-23 NOTE — Plan of Care (Signed)
   Problem: Education: Goal: Ability to describe self-care measures that may prevent or decrease complications (Diabetes Survival Skills Education) will improve Outcome: Not Progressing Goal: Individualized Educational Video(s) Outcome: Not Progressing

## 2023-06-24 DIAGNOSIS — F79 Unspecified intellectual disabilities: Secondary | ICD-10-CM | POA: Diagnosis not present

## 2023-06-24 DIAGNOSIS — T43505A Adverse effect of unspecified antipsychotics and neuroleptics, initial encounter: Secondary | ICD-10-CM | POA: Diagnosis not present

## 2023-06-24 DIAGNOSIS — F2089 Other schizophrenia: Secondary | ICD-10-CM | POA: Diagnosis not present

## 2023-06-24 DIAGNOSIS — G2119 Other drug induced secondary parkinsonism: Secondary | ICD-10-CM | POA: Diagnosis not present

## 2023-06-24 LAB — CBC WITH DIFFERENTIAL/PLATELET
Abs Immature Granulocytes: 0.02 10*3/uL (ref 0.00–0.07)
Basophils Absolute: 0 10*3/uL (ref 0.0–0.1)
Basophils Relative: 0 %
Eosinophils Absolute: 0.1 10*3/uL (ref 0.0–0.5)
Eosinophils Relative: 3 %
HCT: 39.4 % (ref 36.0–46.0)
Hemoglobin: 12.7 g/dL (ref 12.0–15.0)
Immature Granulocytes: 1 %
Lymphocytes Relative: 41 %
Lymphs Abs: 1.6 10*3/uL (ref 0.7–4.0)
MCH: 28.2 pg (ref 26.0–34.0)
MCHC: 32.2 g/dL (ref 30.0–36.0)
MCV: 87.6 fL (ref 80.0–100.0)
Monocytes Absolute: 0.4 10*3/uL (ref 0.1–1.0)
Monocytes Relative: 10 %
Neutro Abs: 1.8 10*3/uL (ref 1.7–7.7)
Neutrophils Relative %: 45 %
Platelets: 160 10*3/uL (ref 150–400)
RBC: 4.5 MIL/uL (ref 3.87–5.11)
RDW: 13.9 % (ref 11.5–15.5)
WBC: 3.9 10*3/uL — ABNORMAL LOW (ref 4.0–10.5)
nRBC: 0 % (ref 0.0–0.2)

## 2023-06-24 LAB — BASIC METABOLIC PANEL
Anion gap: 13 (ref 5–15)
BUN: 13 mg/dL (ref 8–23)
CO2: 27 mmol/L (ref 22–32)
Calcium: 8.5 mg/dL — ABNORMAL LOW (ref 8.9–10.3)
Chloride: 100 mmol/L (ref 98–111)
Creatinine, Ser: 0.92 mg/dL (ref 0.44–1.00)
GFR, Estimated: >60 mL/min (ref >60)
Glucose, Bld: 123 mg/dL — ABNORMAL HIGH (ref 70–99)
Potassium: 3.4 mmol/L — ABNORMAL LOW (ref 3.5–5.1)
Sodium: 140 mmol/L (ref 135–145)

## 2023-06-24 LAB — GLUCOSE, CAPILLARY
Glucose-Capillary: 142 mg/dL — ABNORMAL HIGH (ref 70–99)
Glucose-Capillary: 191 mg/dL — ABNORMAL HIGH (ref 70–99)
Glucose-Capillary: 84 mg/dL (ref 70–99)
Glucose-Capillary: 171 mg/dL — ABNORMAL HIGH (ref 70–99)

## 2023-06-24 LAB — MAGNESIUM: Magnesium: 1.8 mg/dL (ref 1.7–2.4)

## 2023-06-24 NOTE — Consult Note (Signed)
 [facility] Psychiatric Consult Initial  Patient Name: .Annette Martin  MRN: [id number]  DOB: Oct 06, 1955  Consult Order details:  Orders (From admission, onward)     Start     Ordered   06/22/23 0002  IP CONSULT TO PSYCHIATRY       Ordering Provider: Sundil, Subrina, MD  Provider:  (Not yet assigned)  Question Answer Comment  Location [facility]   Reason for Consult? Extrapyramidal syndrome side effect of Haldol .  Need medication readjustment.      06/22/23 0001             Mode of Visit: In person    Psychiatry Consult Evaluation  Service Date: June 24, 2023 LOS:  LOS: 3 days  Chief Complaint: Altered Mental Status from Flu  Primary Psychiatric Diagnoses  Drug-induced parkinsonism, extra pyramidal symptoms 2.  Schizophrenia 3.  Intellectual Disability 4.  Behavioral problems  Assessment  Annette Martin is a 67 y.o. female admitted: Medically for 06/20/2023  7:02 PM for encephalopathy. She carries the psychiatric diagnoses of schizophrenia, intellectual disability, depression and has a past medical history of  thyroid disease, hypertension, T2DM, Rhabdomyolisis, Vitamin D deficient osteomalacia,.   Her current presentation of altered mental status, mixed rigidity, asymmetric tremor, pill rolling behavior and diaphoresis is most consistent with drug-induced parkinsonism in the setting of antipsychotic use She meets criteria for drug-induced parkinsonism based on history by patient, chart-reviewed history, physical presentation. Current outpatient psychotropic medications include haloperidol  5 mg TID, depakote  250 DR TID, gabapentin  300 mg TID, amantadine  100 mg BID, clonazepam  0.5 mg TID, and diphenhydramine  25 mg TID and historically she has had a modest response to these medications. She was compliant with medications prior to admission as evidenced by facility records. On initial examination, patient demonstrated good self control, but in subsequent  interviews has shown low frustration tolerance and emotional lability. Her life circumstances are challenging and it is regrettable that there is not more that we can offer her.  Please see plan below for detailed recommendations. We will continue to follow.  Diagnoses:  Active Hospital problems: Principal Problem:   Encephalopathy Active Problems:   Influenza A   Dehydration   Acute metabolic encephalopathy    Plan   ## Psychiatric Medication Recommendations:  -- Reduce clonazepam  0.5 mg TID PRN to BID PRN -- Reduce diphenhydramine  25 mg TID PRN to BID PRN -- Continue sertraline  200 mg at bedtime  -- Continue divalproex  250 mg TID  -- Continue gabapentin  300 mg TID -- Continue amantadine  100 mg BID -- Hold haldol  except for extreme agitation  Patient is on a high number of medications with possible psychotropic effects that can also cause delirium or her parkinsonism-like symptoms. She would benefit from a significant streamlining of her outpatient medications. For now, the haldol  seems to be the most likely culprit for her extrapyramidal symptoms. Patient would be better served with a second generation antipsychotic. Could also consider reducing the zoloft  with the treatment of the quetiapine .  -- Continue quetiapine  50 mg at bedtime for schizophrenia,  -- Continue quetiapine  25 mg BID (0800 and 1400) for schizophrenia   ## Medical Decision Making Capacity: Patient has a guardian and has thus been adjudicated incompetent; please involve patients guardian in medical decision making Patient's primary guardian is in jail.  ## Further Work-up:  -- Added on Creatinine Kinase to previous collection. Level came back elevated at 394. While pt on Qtc prolonging medications, please monitor & replete K+ to  4 and Mg2+ to 2 -- most recent EKG on 06/20/2023 had QtC of 443 ms -- Pertinent labwork reviewed earlier this admission includes: venous blood gases, CMP, CBC, respiratory panel, UA   ##  Disposition:-- There are no psychiatric contraindications to discharge at this time  ## Behavioral / Environmental: -Delirium Precautions: Delirium Interventions for Nursing and Staff: - RN to open blinds every AM. - To Bedside: Glasses, hearing aide, and pt's own shoes. Make available to patients. when possible and encourage use. - Encourage po fluids when appropriate, keep fluids within reach. - OOB to chair with meals. - Passive ROM exercises to all extremities with AM & PM care. - RN to assess orientation to person, time and place QAM and PRN. - Recommend extended visitation hours with familiar family/friends as feasible. - Staff to minimize disturbances at night. Turn off television when pt asleep or when not in use.    ## Safety and Observation Level:  - Based on my clinical evaluation, I estimate the patient to be at low risk of self harm in the current setting. - At this time, we recommend  routine. This decision is based on my review of the chart including patient's history and current presentation, interview of the patient, mental status examination, and consideration of suicide risk including evaluating suicidal ideation, plan, intent, suicidal or self-harm behaviors, risk factors, and protective factors. This judgment is based on our ability to directly address suicide risk, implement suicide prevention strategies, and develop a safety plan while the patient is in the clinical setting. Please contact our team if there is a concern that risk level has changed.  CSSR Risk Category:   Suicide Risk Assessment: Patient has following modifiable risk factors for suicide: none which we are addressing by continuing with planned management. Patient has following non-modifiable or demographic risk factors for suicide: psychiatric hospitalization Patient has the following protective factors against suicide: Access to outpatient mental health care  Thank you for this consult request. Recommendations  have been communicated to the primary team.  We will follow at this time.   Lynwood Morene Lavone Delsie, MD PGY-1       History of Present Illness  Relevant Aspects of [facility] Course:  Admitted on 06/20/2023 for encephalopathy in the setting of Influenza A. They are currently inpatient being treated for the flu, when team noticed increased rigidity, diaphoresis, and increased tremor.   Patient Report:  Met patient bedside. The patient was sitting upright in a chair. Still coughing frequently, but is better than yesterday. She was oriented to person and place. She was not oriented to date. Patient reports her mood is improved, that she is less anxious than yesterday, and less sad. When asked what she needed, she responded just a hamburger. Patient is improved relative to yesterday.  Psych ROS:  Depression: denied symptoms of depression other than sleep changes and very recent poor mood due to illness Anxiety: Denies Mania (lifetime and current): Denies Psychosis: (lifetime and current): Did not understand question.  Collateral information:  Pt has legal guardian, but he is currently in prison.   Will consult facility closer to discharge.   Review of Systems  Constitutional:  Positive for chills, diaphoresis, fever and malaise/fatigue. Negative for weight loss.  HENT:  Negative for hearing loss.   Respiratory:  Positive for cough, sputum production and shortness of breath.   Cardiovascular:  Negative for chest pain.  Gastrointestinal:  Negative for abdominal pain.  Musculoskeletal:  Positive for myalgias.  Neurological:  Positive for weakness. Negative for headaches.  Psychiatric/Behavioral:  Positive for depression. Negative for hallucinations, memory loss, substance abuse and suicidal ideas. The patient is nervous/anxious. The patient does not have insomnia.      Psychiatric and Social History  Psychiatric History:  Information collected from chart   Prev Dx/Sx:  Schizophrenia, IDD. Current Psych Provider: [facility] Meds (current):  Current Outpatient Medications  Medication Instructions  . amantadine  (SYMMETREL ) 100 mg, Oral, 2 times daily  . atorvastatin  (LIPITOR) 10 mg, Oral, Daily at bedtime  . bethanechol  (URECHOLINE ) 25 mg, Oral, 3 times daily  . clonazePAM  (KLONOPIN ) 0.5 mg, Oral, 3 times daily  . divalproex  (DEPAKOTE ) 250 mg, Oral, 3 times daily  . docusate sodium  (COLACE) 200 mg, Oral, 2 times daily  . ferrous sulfate  325 mg, Oral, Daily with breakfast  . fesoterodine  (TOVIAZ ) 4 mg, Oral, Daily at bedtime  . gabapentin  (NEURONTIN ) 300 mg, Oral, 3 times daily  . haloperidol  (HALDOL ) 5 mg, Oral, 2 times daily  . lactulose  (CHRONULAC ) 20 g, Oral, Daily  . lidocaine  (LIDODERM ) 5 % 1 patch, Transdermal, Every 24 hours, Remove & Discard patch within 12 hours or as directed by MD  . losartan  (COZAAR ) 25 mg, Oral, Daily  . Magnesium  400 MG CAPS 1 tablet, Oral, 2 times daily  . meclizine (ANTIVERT) 25 mg, 3 times daily PRN  . meloxicam (MOBIC) 7.5 mg, Oral, Daily  . metFORMIN  (GLUCOPHAGE ) 1,000 mg, Oral, 2 times daily with meals  . ondansetron  (ZOFRAN ) 4 mg, Every 6 hours PRN  . pantoprazole  (PROTONIX ) 40 mg, Oral, 2 times daily  . sertraline  (ZOLOFT ) 200 mg, Oral, Daily at bedtime  Previous Med Trials: ziprasidone , risperidone , hydroxyzine , quetiapine , temazepam , valbenazine  tosylate Therapy: none  Prior Psych Hospitalization: 2019  Prior Self Harm: Unable to assess. Prior Violence: Unable to assess.  Family Psych History: No history on file, Unable to assess from patient reports. Family Hx suicide: No history on file, Unable to assess from patient reports.  Social History:  Developmental Hx: Intellectually disabled. Educational Hx: No history on file, Unable to assess from patient reports. Occupational Hx: disabled Legal Hx: chart review does not provide legal history, patient not able to communicate. Living Situation: Patient  lives in assisted living facility [facility]  [address]  [facility] [place] [address]  Spiritual Hx: no Access to weapons/lethal means: Denies (per cousin, Avari Nevares)   Substance History Alcohol: Denies  Type of alcohol Denies  Last Drink Denies  Number of drinks per day Denies  Tobacco: Denies  Illicit drugs: Denies  Prescription drug abuse: Denies  Rehab hx: Denies   Exam Findings  Physical Exam:  Vital Signs:  Temp:  [98 F (36.7 C)-98.8 F (37.1 C)] 98.8 F (37.1 C) (02/06 0523) Pulse Rate:  [73-92] 73 (02/06 0523) Resp:  [18] 18 (02/06 0523) BP: (121-143)/(62-99) 143/85 (02/06 0523) SpO2:  [94 %-98 %] 96 % (02/06 0523) Blood pressure (!) 143/85, pulse 73, temperature 98.8 F (37.1 C), resp. rate 18, SpO2 96%. There is no height or weight on file to calculate BMI.  Physical Exam Vitals and nursing note reviewed.  Constitutional:      Appearance: She is ill-appearing and diaphoretic.  HENT:     Head: Normocephalic and atraumatic.     Nose: Congestion present.  Pulmonary:     Breath sounds: Rales present.  Abdominal:     Palpations: Abdomen is soft.  Skin:    General: Skin is warm.  Neurological:  Mental Status: She is alert. Mental status is at baseline.     Gait: Gait abnormal.  Psychiatric:        Attention and Perception: Attention and perception normal.        Mood and Affect: Mood is depressed.        Speech: Speech normal.        Behavior: Behavior is cooperative.        Thought Content: Thought content is not paranoid or delusional. Thought content does not include homicidal or suicidal ideation.        Cognition and Memory: Memory normal. Cognition is impaired.        Judgment: Judgment normal.     Mental Status Exam: General Appearance: Casual  Orientation:  Other:  Name, place  Memory:  Immediate;   Good Remote;   Fair  Concentration:  Concentration: Good  Recall:  NA  Attention  Good  Eye Contact:  Good  Speech:  Normal Rate   Language:  Fair - speech impediment  Volume:  Decreased  Mood: Better, less worried  Affect:  Appropriate  Thought Process:  Coherent and Linear  Thought Content:  WDL  Suicidal Thoughts:  No  Homicidal Thoughts:  No  Judgement:  Intact  Insight:  Shallow  Psychomotor Activity:  EPS, Increased, Mannerisms, Restlessness, and Tremor - Minimal left-sided tremor. The right sided tremor is still present, but is more consistent with an intention tremor.  Akathisia:  Yes  Fund of Knowledge:  Fair      Assets:  Desire for Improvement Leisure Time Vocational/Educational  Cognition:  Impaired,  Mild  ADL's:  Impaired  AIMS (if indicated):        Other History   These have been pulled in through the EMR, reviewed, and updated if appropriate.  Family History:  The patient's family history is not on file.  Medical History: Past Medical History:  Diagnosis Date  . Depression   . Diabetes mellitus    type 2  . GERD (gastroesophageal reflux disease)   . Hypertension   . Hypoxemia   . Mental retardation   . Neuroleptic malignant syndrome   . Rhabdomyolysis   . Schizophrenia (HCC)   . Thyroid disease   . Vitamin D deficient osteomalacia     Surgical History: Past Surgical History:  Procedure Laterality Date  . COLONOSCOPY  2010   MANN     Medications:   Current Facility-Administered Medications:  .  acetaminophen  (TYLENOL ) tablet 1,000 mg, 1,000 mg, Oral, Q6H PRN, Segars, Jonathan, MD, 1,000 mg at 06/22/23 2139 .  albuterol  (PROVENTIL ) (2.5 MG/3ML) 0.083% nebulizer solution 2.5 mg, 2.5 mg, Nebulization, Q4H PRN, Sundil, Subrina, MD, 2.5 mg at 06/22/23 2135 .  amantadine  (SYMMETREL ) capsule 100 mg, 100 mg, Oral, BID, Segars, Jonathan, MD, 100 mg at 06/23/23 2108 .  atorvastatin  (LIPITOR) tablet 10 mg, 10 mg, Oral, QHS, Segars, Dorn, MD, 10 mg at 06/23/23 2109 .  bethanechol  (URECHOLINE ) tablet 25 mg, 25 mg, Oral, TID, Segars, Jonathan, MD, 25 mg at 06/23/23 2109 .   clonazePAM  (KLONOPIN ) tablet 0.5 mg, 0.5 mg, Oral, BID PRN, Arlon Carliss ORN, DO, 0.5 mg at 06/23/23 1352 .  diphenhydrAMINE  (BENADRYL ) capsule 25 mg, 25 mg, Oral, BID PRN, Arlon Carliss ORN, DO, 25 mg at 06/23/23 1352 .  divalproex  (DEPAKOTE ) DR tablet 250 mg, 250 mg, Oral, TID, Segars, Dorn, MD, 250 mg at 06/23/23 2110 .  docusate sodium  (COLACE) capsule 100 mg, 100 mg, Oral, BID, Segars, Jonathan, MD,  100 mg at 06/23/23 2109 .  ferrous sulfate  tablet 325 mg, 325 mg, Oral, Q breakfast, Segars, Jonathan, MD, 325 mg at 06/23/23 0906 .  fesoterodine  (TOVIAZ ) tablet 4 mg, 4 mg, Oral, QHS, Segars, Dorn, MD, 4 mg at 06/23/23 2108 .  gabapentin  (NEURONTIN ) capsule 300 mg, 300 mg, Oral, TID, Maree, Pratik D, DO, 300 mg at 06/23/23 2109 .  insulin  aspart (novoLOG ) injection 0-5 Units, 0-5 Units, Subcutaneous, QHS, Sundil, Subrina, MD .  insulin  aspart (novoLOG ) injection 0-6 Units, 0-6 Units, Subcutaneous, TID WC, Sundil, Subrina, MD, 1 Units at 06/23/23 1708 .  lactulose  (CHRONULAC ) 10 GM/15ML solution 10 g, 10 g, Oral, Daily PRN, Segars, Jonathan, MD .  LORazepam  (ATIVAN ) injection 2 mg, 2 mg, Intravenous, Q4H PRN, Arlon Carliss ORN, DO, 2 mg at 06/23/23 1713 .  melatonin tablet 6 mg, 6 mg, Oral, QHS PRN, Segars, Jonathan, MD .  ondansetron  (ZOFRAN ) injection 4 mg, 4 mg, Intravenous, Q6H PRN, Segars, Dorn, MD .  oseltamivir  (TAMIFLU ) capsule 75 mg, 75 mg, Oral, BID, Segars, Jonathan, MD, 75 mg at 06/23/23 2108 .  pantoprazole  (PROTONIX ) EC tablet 40 mg, 40 mg, Oral, BID, Segars, Dorn, MD, 40 mg at 06/23/23 2110 .  QUEtiapine  (SEROQUEL  XR) 24 hr tablet 50 mg, 50 mg, Oral, QHS, 50 mg at 06/23/23 2110 **AND** QUEtiapine  (SEROQUEL ) tablet 25 mg, 25 mg, Oral, BID, [name], MD, 25 mg at 06/23/23 1229 .  sertraline  (ZOLOFT ) tablet 200 mg, 200 mg, Oral, QHS, Segars, Jonathan, MD, 200 mg at 06/23/23 2110  Allergies: No Known Allergies  Lynwood Morene Lavone Delsie,  MD

## 2023-06-24 NOTE — Plan of Care (Signed)

## 2023-06-24 NOTE — Plan of Care (Signed)

## 2023-06-24 NOTE — Progress Notes (Signed)
  Progress Note   Patient: Annette Martin FMW:[id number] DOB: 04-29-1956 DOA: 06/20/2023     3 DOS: the patient was seen and examined on 06/24/2023   Brief hospital course: No notes on file Annette Martin is a 68 y.o. female with hx of schizophrenia, diabetes, hyperlipidemia, hypothyroidism, urinary incontinence, who was brought in from [facility] for altered mental status.  She was admitted for acute metabolic encephalopathy in the setting of influenza A infection as well as some dehydration.  She is noted to have some bradykinesia with cogwheel rigidity and resting tremor suspicious for EPS.  EEG performed with results pending.  Psychiatry consulted for assistance in management of medications.  PT recommending SNF.   Assessment and Plan: No notes have been filed under this hospital service. Service: Hospitalist  Encephalopathy acute, metabolic with bradykinesia and resting tremor -Suspect her encephalopathy is multifactorial related to her influenza A infection, dehydration, medication effects.  Appreciate psychiatry for adjustment of antipsychotic medications.  Pt appears to be improving.    Acute Influenza A  - No longer requiring nasal cannula, breathing comfortably on room air.  Continue Tamiflu  75 mg twice daily for 5 days.  Albuterol  prn, incentive spirometer.  Supportive care.     Dehydration -Appears to be resolving after IV fluid hydration.  Encourage PO intake.   Deconditioning -PT evaluation recommending SNF   History of schizophrenia/intellectual disability/depression -Appreciate evaluation by psychiatry.  Will heed dose reduction of sedative medications per their recommendation.  Diabetes: Home regimen is metformin .  Glucose within goal range can add SSI if runs high. Neuropathy: Gabapentin  resumed HLD: Continue home atorvastatin  Hypothyroidism: TSH 3.4 Urinary incontinence: Continue home bethanechol  3 times daily and fesoteridine nightly GERD: home PPI   Goals of  care - Working closely with case management on disposition planning.     Subjective: Pt resting comfortably, however complaining she still does not feel well.  Admits to overall malaise and nausea.  Despite this, clinically appears improved from yesterday.  No longer requiring nasal cannula.  Denies fever, worsening shortness of breath, vomiting, chest pain, abdominal pain.    Physical Exam: Vitals:   06/23/23 1551 06/23/23 1934 06/24/23 0523 06/24/23 1018  BP: 126/62 (!) 121/99 (!) 143/85 138/83  Pulse: 92 90 73 92  Resp:  18 18 16   Temp: 98 F (36.7 C) 98.6 F (37 C) 98.8 F (37.1 C) 97.9 F (36.6 C)  TempSrc:    Axillary  SpO2: 97% 94% 96% 97%   Physical Exam GENERAL:  Alert, pleasant, malaise  HEENT:  EOMI CARDIOVASCULAR:  RRR, no murmurs appreciated RESPIRATORY:  Clear to auscultation, poor air movement GASTROINTESTINAL:  Soft, nontender, nondistended EXTREMITIES:  No LE edema bilaterally NEURO:  No new focal deficits appreciated SKIN:  No rashes noted PSYCH:  flat mood and affect   Data Reviewed:  There are no new results to review at this time.  Family Communication:   Disposition: Status is: Inpatient Remains inpatient appropriate because: Ongoing hypoxia from influenza  Planned Discharge Destination: Skilled nursing facility    Time spent: 40 minutes  Author: Carliss LELON Canales, DO 06/24/2023 1:02 PM  For on call review [email or web address].

## 2023-06-24 NOTE — TOC Progression Note (Signed)
 Transition of Care [facility]) - Progression Note    Patient Details  Name: Annette Martin MRN: [id number] Date of Birth: [birth year]  [facility] of [facility]) CM/SW Contact  Lauraine FORBES Saa, LCSW Phone Number: 06/24/2023, 3:50 PM  Clinical Narrative:     3:50 PM CSW attempted to call patient's brother/legal guardian, Dale, to provide SNF decisions. CSW left message requesting a return call [phone]).    Barriers to Discharge: Continued Medical Work up  Expected Discharge Plan and Services       Living arrangements for the past 2 months: Assisted Living Facility                                       Social Determinants of Health (SDOH) Interventions SDOH Screenings   Food Insecurity: Patient Unable To Answer (06/23/2023)  Housing: Patient Unable To Answer (06/23/2023)  Transportation Needs: Patient Unable To Answer (06/23/2023)  Utilities: Patient Unable To Answer (06/23/2023)  Alcohol Screen: Low Risk  (04/05/2018)  Social Connections: Patient Unable To Answer (06/23/2023)  Tobacco Use: Low Risk  (06/12/2023)    Readmission Risk Interventions     No data to display

## 2023-06-24 NOTE — Progress Notes (Addendum)
 Occupational Therapy Treatment Patient Details Name: PAETYN PIETRZAK MRN: [id number] DOB: 05/29/1955 Today's Date: 06/24/2023   History of present illness The pt is a 68 yo female presenting from ALF on 2/2 with AMS. Work up revealed: acute metabolic encephalopathy, influenza A, and dehydration. PMH includes: schizophrenia, DM II, HLD, HTN, hypothyroidism, and urinary incontinence.   OT comments  Pt with RN and NT receiving bath at bed level upon entry, assisted with Pt rolling L/R, calming Pt down as she was anxious about rolling L/R with mod A. Pt assisted to sitting position on EOB, max A.  Pt able to sit EOB with 1 arm supported by bed frame CGA to min A to maintain balance with posterior lean. Pt assisted to recliner, mod A x2 stand pivot, 2 attempts as Pt not able to stand for too long. Pt has difficulty with feeding, barely able to take a sip of her drink without spilling it, difficulty reaching straw to mouth, min-mod A for feeding. Pt made comfortable in chair,  RN giving meds upon leaving, call bell, chair alarm placed, ginger ale given upon request. Pt would benefit from continued acute OT to maximize progress, DC to postacute rehab still appropriate.       If plan is discharge home, recommend the following:  Two people to help with walking and/or transfers;A lot of help with bathing/dressing/bathroom;Assistance with cooking/housework;Assist for transportation;Help with stairs or ramp for entrance   Equipment Recommendations  None recommended by OT    Recommendations for Other Services      Precautions / Restrictions Precautions Precautions: Fall Restrictions Weight Bearing Restrictions Per Provider Order: No       Mobility Bed Mobility Overal bed mobility: Needs Assistance Bed Mobility: Rolling, Supine to Sit Rolling: Used rails, Mod assist   Supine to sit: Max assist, HOB elevated     General bed mobility comments: mod A for rolling L/R to complete bed level hygiene,  max A for supine to sit.    Transfers Overall transfer level: Needs assistance Equipment used: 2 person hand held assist Transfers: Sit to/from Stand, Bed to chair/wheelchair/BSC Sit to Stand: Max assist, +2 physical assistance Stand pivot transfers: Max assist, +2 physical assistance, +2 safety/equipment         General transfer comment: transfer to recliner max A, Pt has difficulty with pivot without therapist assist     Balance Overall balance assessment: Needs assistance Sitting-balance support: Single extremity supported, Feet supported Sitting balance-Leahy Scale: Poor   Postural control: Posterior lean Standing balance support: Bilateral upper extremity supported, During functional activity Standing balance-Leahy Scale: Poor Standing balance comment: able to stand with x2 HHA, relient on external support.                           ADL either performed or assessed with clinical judgement   ADL Overall ADL's : Needs assistance/impaired Eating/Feeding: Moderate assistance   Grooming: Moderate assistance   Upper Body Bathing: Maximal assistance;Bed level   Lower Body Bathing: Total assistance;Bed level   Upper Body Dressing : Moderate assistance;Bed level   Lower Body Dressing: Total assistance;Bed level   Toilet Transfer: Moderate assistance;+2 for safety/equipment;Stand-pivot;BSC/3in1   Toileting- Clothing Manipulation and Hygiene: Maximal assistance;Sitting/lateral lean         General ADL Comments: Pt completed bed level bathing/hygiene, mod A for rolling L/R, total for bathing with wipes, mod A x2 for transfer to recliner.    Extremity/Trunk Assessment Upper Extremity Assessment  Upper Extremity Assessment: Overall WFL for tasks assessed RUE Deficits / Details: Pt reports B hand numbness to all fingers. Pt has difficulty with functional grip and FM, general BUE weakness limits participation in all ADLs LUE Deficits / Details: Pt reports B hand  numbness to all fingers. Pt has difficulty with functional grip and FM, general BUE weakness limits participation in all ADLs            Vision       Perception     Praxis      Cognition Arousal: Alert Behavior During Therapy: Flat affect, Anxious Overall Cognitive Status: No family/caregiver present to determine baseline cognitive functioning                                 General Comments: able to participate in therapy, anxious and saying she wants to go home.. Pt able to follow simple commands consistently.        Exercises      Shoulder Instructions       General Comments      Pertinent Vitals/ Pain       Pain Assessment Pain Assessment: No/denies pain  Home Living                                          Prior Functioning/Environment              Frequency  Min 1X/week        Progress Toward Goals  OT Goals(current goals can now be found in the care plan section)  Progress towards OT goals: Progressing toward goals  Acute Rehab OT Goals Patient Stated Goal: unable to participate in goal setting OT Goal Formulation: Patient unable to participate in goal setting Time For Goal Achievement: 07/06/23 Potential to Achieve Goals: Fair ADL Goals Pt Will Perform Upper Body Dressing: with min assist Pt Will Perform Lower Body Dressing: with min assist;sit to/from stand Pt Will Transfer to Toilet: with min assist;bedside commode Pt Will Perform Toileting - Clothing Manipulation and hygiene: with min assist;sitting/lateral leans  Plan      Co-evaluation                 AM-PAC OT 6 Clicks Daily Activity     Outcome Measure   Help from another person eating meals?: A Lot Help from another person taking care of personal grooming?: A Lot Help from another person toileting, which includes using toliet, bedpan, or urinal?: Total Help from another person bathing (including washing, rinsing, drying)?: A  Lot Help from another person to put on and taking off regular upper body clothing?: A Lot Help from another person to put on and taking off regular lower body clothing?: Total 6 Click Score: 10    End of Session Equipment Utilized During Treatment: Gait belt  OT Visit Diagnosis: Unsteadiness on feet (R26.81);Other abnormalities of gait and mobility (R26.89);Muscle weakness (generalized) (M62.81);Pain;Other symptoms and signs involving cognitive function;Feeding difficulties (R63.3)   Activity Tolerance Patient tolerated treatment well   Patient Left in chair;with call bell/phone within reach;with chair alarm set;with nursing/sitter in room   Nurse Communication Mobility status        Time: 8967-8887 OT Time Calculation (min): 40 min  Charges: OT General Charges $OT Visit: 1 Visit OT Treatments $Self Care/Home Management : 23-37 mins $Therapeutic  Activity: 8-22 mins  [facility], OTR/L   Elouise JONELLE Bott 06/24/2023, 11:45 AM

## 2023-06-24 NOTE — TOC Progression Note (Signed)
 Transition of Care [facility]) - Progression Note    Patient Details  Name: Annette Martin MRN: [id number] Date of Birth: 08-31-1955  [facility]) CM/SW Contact  Lauraine FORBES Saa, LCSW Phone Number: 06/24/2023, 10:22 AM  Clinical Narrative:     10:22 AM PASRRE received ([id number]) and valid for one month (02/06-03/06).    Barriers to Discharge: Continued Medical Work up  Expected Discharge Plan and Services       Living arrangements for the past 2 months: Assisted Living Facility                                       Social Determinants of Health (SDOH) Interventions SDOH Screenings   Food Insecurity: Patient Unable To Answer (06/23/2023)  Housing: Patient Unable To Answer (06/23/2023)  Transportation Needs: Patient Unable To Answer (06/23/2023)  Utilities: Patient Unable To Answer (06/23/2023)  Alcohol Screen: Low Risk  (04/05/2018)  Social Connections: Patient Unable To Answer (06/23/2023)  Tobacco Use: Low Risk  (06/12/2023)    Readmission Risk Interventions     No data to display

## 2023-06-24 NOTE — NC FL2 (Signed)
 [place]  MEDICAID [id number] LEVEL OF CARE FORM     IDENTIFICATION  Patient Name: Annette Martin Birthdate: 08-04-1955 Sex: female Admission Date (Current Location): 06/20/2023  [facility] and [place] Number:  [employer] and Address:  [facility] [place]. [facility], [address] [place], [place] [address]      Provider Number: [id number]  Attending Physician Name and Address:  Arlon Carliss ORN, DO  Relative Name and Phone Number:  Lynwood Dale Jules, Legal Guardian/Brother, [phone]; Alm Jules, [place], [phone]    Current Level of Care: Hospital Recommended Level of Care: Skilled Nursing Facility Prior Approval Number:    Date Approved/Denied:   PASRR Number: [id number]  Discharge Plan: SNF    Current Diagnoses: Patient Active Problem List   Diagnosis Date Noted   Acute metabolic encephalopathy 06/23/2023   Influenza A 06/21/2023   Dehydration 06/21/2023   Encephalopathy 06/20/2023   Tremor 02/19/2023   Hypertension 02/16/2023   Anemia 02/15/2023   Acute left-sided weakness 02/14/2023   Schizophrenia (HCC) 04/05/2018   Intellectual disability 12/05/2012   Diabetes mellitus (HCC) 05/28/2011   Hypothyroidism 05/28/2011   Hyperlipidemia 05/28/2011    Orientation RESPIRATION BLADDER Height & Weight     Place, Self  Normal Incontinent, External catheter Weight:   Height:     BEHAVIORAL SYMPTOMS/MOOD NEUROLOGICAL BOWEL NUTRITION STATUS      Continent Diet (Please see dc summary)  AMBULATORY STATUS COMMUNICATION OF NEEDS Skin   Extensive Assist Verbally Normal                       Personal Care Assistance Level of Assistance  Bathing, Feeding, Dressing Bathing Assistance: Maximum assistance Feeding assistance: Maximum assistance Dressing Assistance: Maximum assistance     Functional Limitations Info             SPECIAL CARE FACTORS FREQUENCY  PT (By licensed PT), OT (By licensed OT), Speech therapy     PT  Frequency: 5x OT Frequency: 5x     Speech Therapy Frequency: 5x      Contractures Contractures Info: Not present    Additional Factors Info  Code Status, Allergies, Psychotropic, Insulin  Sliding Scale Code Status Info: Full Code Allergies Info: NKA Psychotropic Info: Please see dc summary Insulin  Sliding Scale Info: Please see dc summary       Current Medications (06/24/2023):  This is the current hospital active medication list Current Facility-Administered Medications  Medication Dose Route Frequency Provider Last Rate Last Admin   acetaminophen  (TYLENOL ) tablet 1,000 mg  1,000 mg Oral Q6H PRN Keturah Carrier, MD   1,000 mg at 06/22/23 2139   albuterol  (PROVENTIL ) (2.5 MG/3ML) 0.083% nebulizer solution 2.5 mg  2.5 mg Nebulization Q4H PRN Sundil, Subrina, MD   2.5 mg at 06/22/23 2135   amantadine  (SYMMETREL ) capsule 100 mg  100 mg Oral BID Segars, Jonathan, MD   100 mg at 06/23/23 2108   atorvastatin  (LIPITOR) tablet 10 mg  10 mg Oral QHS Segars, Jonathan, MD   10 mg at 06/23/23 2109   bethanechol  (URECHOLINE ) tablet 25 mg  25 mg Oral TID Segars, Jonathan, MD   25 mg at 06/23/23 2109   clonazePAM  (KLONOPIN ) tablet 0.5 mg  0.5 mg Oral BID PRN Arlon Carliss ORN, DO   0.5 mg at 06/23/23 1352   diphenhydrAMINE  (BENADRYL ) capsule 25 mg  25 mg Oral BID PRN Arlon Carliss ORN, DO   25 mg at 06/23/23 1352   divalproex  (DEPAKOTE ) DR tablet 250  mg  250 mg Oral TID Segars, Jonathan, MD   250 mg at 06/23/23 2110   docusate sodium  (COLACE) capsule 100 mg  100 mg Oral BID Segars, Jonathan, MD   100 mg at 06/23/23 2109   ferrous sulfate  tablet 325 mg  325 mg Oral Q breakfast Segars, Jonathan, MD   325 mg at 06/23/23 9093   fesoterodine  (TOVIAZ ) tablet 4 mg  4 mg Oral QHS Segars, Jonathan, MD   4 mg at 06/23/23 2108   gabapentin  (NEURONTIN ) capsule 300 mg  300 mg Oral TID Shah, Pratik D, DO   300 mg at 06/23/23 2109   insulin  aspart (novoLOG ) injection 0-5 Units  0-5 Units Subcutaneous QHS Sundil,  Subrina, MD       insulin  aspart (novoLOG ) injection 0-6 Units  0-6 Units Subcutaneous TID WC Sundil, Subrina, MD   1 Units at 06/23/23 1708   lactulose  (CHRONULAC ) 10 GM/15ML solution 10 g  10 g Oral Daily PRN Segars, Jonathan, MD       LORazepam  (ATIVAN ) injection 2 mg  2 mg Intravenous Q4H PRN Arlon Carliss ORN, DO   2 mg at 06/23/23 1713   melatonin tablet 6 mg  6 mg Oral QHS PRN Segars, Jonathan, MD       ondansetron  (ZOFRAN ) injection 4 mg  4 mg Intravenous Q6H PRN Segars, Dorn, MD       oseltamivir  (TAMIFLU ) capsule 75 mg  75 mg Oral BID Segars, Jonathan, MD   75 mg at 06/23/23 2108   pantoprazole  (PROTONIX ) EC tablet 40 mg  40 mg Oral BID Segars, Jonathan, MD   40 mg at 06/23/23 2110   QUEtiapine  (SEROQUEL  XR) 24 hr tablet 50 mg  50 mg Oral QHS [name], MD   50 mg at 06/23/23 2110   And   QUEtiapine  (SEROQUEL ) tablet 25 mg  25 mg Oral BID [name], MD   25 mg at 06/23/23 1229   sertraline  (ZOLOFT ) tablet 200 mg  200 mg Oral QHS Segars, Jonathan, MD   200 mg at 06/23/23 2110     Discharge Medications: Please see discharge summary for a list of discharge medications.  Relevant Imaging Results:  Relevant Lab Results:   Additional Information SS#: [id number]  Lauraine FORBES Saa, LCSW

## 2023-06-25 LAB — GLUCOSE, CAPILLARY
Glucose-Capillary: 124 mg/dL — ABNORMAL HIGH (ref 70–99)
Glucose-Capillary: 117 mg/dL — ABNORMAL HIGH (ref 70–99)
Glucose-Capillary: 123 mg/dL — ABNORMAL HIGH (ref 70–99)
Glucose-Capillary: 146 mg/dL — ABNORMAL HIGH (ref 70–99)

## 2023-06-25 MED ORDER — QUETIAPINE FUMARATE 25 MG PO TABS
25.0000 mg | ORAL_TABLET | Freq: Every day | ORAL | Status: DC
  Administered 2023-06-26 – 2023-06-28 (×3): 25 mg via ORAL
  Filled 2023-06-25 (×3): qty 1

## 2023-06-25 MED ORDER — QUETIAPINE FUMARATE 25 MG PO TABS
50.0000 mg | ORAL_TABLET | Freq: Every day | ORAL | Status: DC
  Administered 2023-06-25 – 2023-06-27 (×3): 50 mg via ORAL
  Filled 2023-06-25 (×3): qty 2

## 2023-06-25 MED ORDER — QUETIAPINE FUMARATE ER 50 MG PO TB24
50.0000 mg | ORAL_TABLET | Freq: Every day | ORAL | Status: DC
  Administered 2023-06-25 – 2023-06-27 (×3): 50 mg via ORAL
  Filled 2023-06-25 (×4): qty 1

## 2023-06-25 NOTE — Consult Note (Signed)
 Annette Martin  Patient Name: .BRAILYN Martin  MRN: [id number]  DOB: May 25, 1955  Consult Order details:  Orders (From admission, onward)     Start     Ordered   06/22/23 0002  IP CONSULT TO PSYCHIATRY       Ordering Provider: Sundil, Subrina, MD  Provider:  (Not yet assigned)  Question Answer Comment  Location [facility]   Reason for Consult? Extrapyramidal syndrome side effect of Haldol .  Need medication readjustment.      06/22/23 0001             Mode of Visit: In person    Psychiatry Consult Evaluation  Service Date: June 25, 2023 LOS:  LOS: 4 days  Chief Complaint: Altered Mental Status from Flu  Primary Psychiatric Diagnoses  Drug-induced parkinsonism, extra pyramidal symptoms 2.  Schizophrenia 3.  Intellectual Disability 4.  Behavioral problems  Assessment  Annette Martin is a 68 y.o. female admitted: Medically for 06/20/2023  7:02 PM for encephalopathy. She carries the psychiatric diagnoses of schizophrenia, intellectual disability, depression and has a past medical history of  thyroid disease, hypertension, T2DM, Rhabdomyolisis, Vitamin D deficient osteomalacia,.   Her current presentation of altered mental status, mixed rigidity, asymmetric tremor, pill rolling behavior and diaphoresis is most consistent with drug-induced parkinsonism in the setting of antipsychotic use She meets criteria for drug-induced parkinsonism based on history by patient, chart-reviewed history, physical presentation. Current outpatient psychotropic medications include haloperidol  5 mg TID, depakote  250 DR TID, gabapentin  300 mg TID, amantadine  100 mg BID, clonazepam  0.5 mg TID, and diphenhydramine  25 mg TID and historically she has had a modest response to these medications. She was compliant with medications prior to admission as evidenced by facility records. On initial examination, patient demonstrated good self control, but in subsequent  interviews has shown low frustration tolerance and emotional lability. Her life circumstances are challenging and it is regrettable that there is not more that we can offer her. On Martin interview, patient actively hallucinating that there is a doll at the end of her bed. Will increase afternoon seroquel . Please see plan below for detailed recommendations. We will continue to follow.  Diagnoses:  Active Hospital problems: Principal Problem:   Encephalopathy Active Problems:   Influenza A   Dehydration   Acute metabolic encephalopathy    Plan   ## Psychiatric Medication Recommendations:  -- Continue clonazepam  0.5 mg TID PRN to BID PRN -- Continue diphenhydramine  25 mg TID PRN to BID PRN -- Continue sertraline  200 mg at bedtime  -- Continue divalproex  250 mg TID  -- Continue gabapentin  300 mg TID -- Continue amantadine  100 mg BID -- Hold haldol  except for extreme agitation  Patient is on a high number of medications with possible psychotropic effects that can also cause delirium or her parkinsonism-like symptoms. She would benefit from a significant streamlining of her outpatient medications. For now, the haldol  seems to be the most likely culprit for her extrapyramidal symptoms. Patient would be better served with a second generation antipsychotic. Could also consider reducing the zoloft  with the treatment of the quetiapine .  -- Continue quetiapine  50 mg at bedtime for schizophrenia,  -- Continue quetiapine  25 mg 0800, increase quetiapine  50mg  1400 for schizophrenia  ## Medical Decision Making Capacity: Patient has a guardian and has thus been adjudicated incompetent; please involve patients guardian in medical decision making Patient's primary guardian is in jail.  ## Further Work-up:  -- Added on Creatinine Kinase to  previous collection. Level came back elevated at 394. While pt on Qtc prolonging medications, please monitor & replete K+ to 4 and Mg2+ to 2 -- most recent EKG on  06/20/2023 had QtC of 443 ms -- Pertinent labwork reviewed earlier this admission includes: venous blood gases, CMP, CBC, respiratory panel, UA   ## Disposition:-- There are no psychiatric contraindications to discharge at this time  ## Behavioral / Environmental: -Delirium Precautions: Delirium Interventions for Nursing and Staff: - RN to open blinds every AM. - To Bedside: Glasses, hearing aide, and pt's own shoes. Make available to patients. when possible and encourage use. - Encourage po fluids when appropriate, keep fluids within reach. - OOB to chair with meals. - Passive ROM exercises to all extremities with AM & PM care. - RN to assess orientation to person, time and place QAM and PRN. - Recommend extended visitation hours with familiar family/friends as feasible. - Staff to minimize disturbances at night. Turn off television when pt asleep or when not in use.   ## Safety and Observation Level:  - Based on my clinical evaluation, I estimate the patient to be at low risk of self harm in the current setting. - At this time, we recommend  routine. This decision is based on my review of the chart including patient's history and current presentation, interview of the patient, mental status examination, and consideration of suicide risk including evaluating suicidal ideation, plan, intent, suicidal or self-harm behaviors, risk factors, and protective factors. This judgment is based on our ability to directly address suicide risk, implement suicide prevention strategies, and develop a safety plan while the patient is in the clinical setting. Please contact our team if there is a concern that risk level has changed.  CSSR Risk Category:   Suicide Risk Assessment: Patient has following modifiable risk factors for suicide: none which we are addressing by continuing with planned management. Patient has following non-modifiable or demographic risk factors for suicide: psychiatric hospitalization Patient  has the following protective factors against suicide: Access to outpatient mental health care  Thank you for this consult request. Recommendations have been communicated to the primary team.  We will follow at this time.   Corean Minor, MD PGY-2       History of Present Illness  Relevant Aspects of [facility] Course:  Admitted on 06/20/2023 for encephalopathy in the setting of Influenza A. They are currently inpatient being treated for the flu, when team noticed increased rigidity, diaphoresis, and increased tremor.   Patient Report:  Patient was seen laying in her bed. She reports get that doll off my bed. When asked where it is, she points to an empty space at the end of her bed. Tongue is still noted to be intermittently sticking out, intermittent tremor noted. She denies SI/HI. She denies any auditory hallucinations. Reports good sleep and appetite.   Psych ROS:  Depression: denied symptoms of depression other than sleep changes and very recent poor mood due to illness Anxiety: Denies Mania (lifetime and current): Denies Psychosis: (lifetime and current): Did not understand question.  Collateral information:  Pt has legal guardian, but he is currently in prison.   Will consult facility closer to discharge.   Review of Systems  Constitutional:  Positive for chills, diaphoresis, fever and malaise/fatigue. Negative for weight loss.  HENT:  Negative for hearing loss.   Respiratory:  Positive for cough, sputum production and shortness of breath.   Cardiovascular:  Negative for chest pain.  Gastrointestinal:  Negative  for abdominal pain.  Musculoskeletal:  Positive for myalgias.  Neurological:  Positive for weakness. Negative for headaches.  Psychiatric/Behavioral:  Positive for depression. Negative for hallucinations, memory loss, substance abuse and suicidal ideas. The patient is nervous/anxious. The patient does not have insomnia.      Psychiatric and Social History   Psychiatric History:  Information collected from chart   Prev Dx/Sx: Schizophrenia, IDD. Current Psych Provider: [facility] Meds (current):  Current Outpatient Medications  Medication Instructions  . amantadine  (SYMMETREL ) 100 mg, Oral, 2 times daily  . atorvastatin  (LIPITOR) 10 mg, Oral, Daily at bedtime  . bethanechol  (URECHOLINE ) 25 mg, Oral, 3 times daily  . clonazePAM  (KLONOPIN ) 0.5 mg, Oral, 3 times daily  . divalproex  (DEPAKOTE ) 250 mg, Oral, 3 times daily  . docusate sodium  (COLACE) 200 mg, Oral, 2 times daily  . ferrous sulfate  325 mg, Oral, Daily with breakfast  . fesoterodine  (TOVIAZ ) 4 mg, Oral, Daily at bedtime  . gabapentin  (NEURONTIN ) 300 mg, Oral, 3 times daily  . haloperidol  (HALDOL ) 5 mg, Oral, 2 times daily  . lactulose  (CHRONULAC ) 20 g, Oral, Daily  . lidocaine  (LIDODERM ) 5 % 1 patch, Transdermal, Every 24 hours, Remove & Discard patch within 12 hours or as directed by MD  . losartan  (COZAAR ) 25 mg, Oral, Daily  . Magnesium  400 MG CAPS 1 tablet, Oral, 2 times daily  . meclizine (ANTIVERT) 25 mg, 3 times daily PRN  . meloxicam (MOBIC) 7.5 mg, Oral, Daily  . metFORMIN  (GLUCOPHAGE ) 1,000 mg, Oral, 2 times daily with meals  . ondansetron  (ZOFRAN ) 4 mg, Every 6 hours PRN  . pantoprazole  (PROTONIX ) 40 mg, Oral, 2 times daily  . sertraline  (ZOLOFT ) 200 mg, Oral, Daily at bedtime  Previous Med Trials: ziprasidone , risperidone , hydroxyzine , quetiapine , temazepam , valbenazine  tosylate Therapy: none  Prior Psych Hospitalization: 2019  Prior Self Harm: Unable to assess. Prior Violence: Unable to assess.  Family Psych History: No history on file, Unable to assess from patient reports. Family Hx suicide: No history on file, Unable to assess from patient reports.  Social History:  Developmental Hx: Intellectually disabled. Educational Hx: No history on file, Unable to assess from patient reports. Occupational Hx: disabled Legal Hx: chart review does not provide  legal history, patient not able to communicate. Living Situation: Patient lives in assisted living facility [facility]  [address]  [facility] [place] [address]  Spiritual Hx: no Access to weapons/lethal means: Denies (per cousin, Shalen Petrak)   Substance History Alcohol: Denies  Type of alcohol Denies  Last Drink Denies  Number of drinks per day Denies  Tobacco: Denies  Illicit drugs: Denies  Prescription drug abuse: Denies  Rehab hx: Denies   Exam Findings  Physical Exam:  Vital Signs:  Temp:  [97.6 F (36.4 C)-98 F (36.7 C)] 97.6 F (36.4 C) (02/07 0500) Pulse Rate:  [68-83] 68 (02/07 0836) Resp:  [16-19] 16 (02/07 0836) BP: (103-134)/(47-75) 103/47 (02/07 0836) SpO2:  [95 %-99 %] 99 % (02/07 0836) Blood pressure (!) 103/47, pulse 68, temperature 97.6 F (36.4 C), temperature source Axillary, resp. rate 16, SpO2 99%. There is no height or weight on file to calculate BMI.  Physical Exam Vitals and nursing note reviewed.  Constitutional:      Appearance: She is ill-appearing and diaphoretic.  HENT:     Head: Normocephalic and atraumatic.     Nose: Congestion present.  Skin:    General: Skin is warm.  Neurological:     Mental Status: She is  alert. Mental status is at baseline.  Psychiatric:        Attention and Perception: Attention normal. She perceives visual hallucinations.        Speech: Speech normal.        Behavior: Behavior is cooperative.        Thought Content: Thought content is not paranoid or delusional. Thought content does not include homicidal or suicidal ideation.        Cognition and Memory: Memory normal. Cognition is impaired.    Mental Status Exam: General Appearance: Casual  Orientation:  Other:  Name, place, year  Memory:  Immediate;   Good Remote;   Fair  Concentration:  Concentration: Good  Recall:  NA  Attention  Good  Eye Contact:  Good  Speech:  Normal Rate  Language:  Fair - speech impediment  Volume:  Normal  Mood:  Get that doll out of my bed  Affect:  Appropriate  Thought Process:  Coherent and Linear  Thought Content:  WDL  Suicidal Thoughts:  No  Homicidal Thoughts:  No  Judgement:  Intact  Insight:  Shallow  Psychomotor Activity:  EPS, Increased, Mannerisms, Restlessness, and Tremor - Minimal left-sided tremor. The right sided tremor is still present, but is more consistent with an intention tremor. Tongue intermittently sticking out  Akathisia:  Yes  Fund of Knowledge:  Fair      Assets:  Desire for Improvement Leisure Time Vocational/Educational  Cognition:  Impaired,  Mild  ADL's:  Impaired  AIMS (if indicated):        Other History   These have been pulled in through the EMR, reviewed, and updated if appropriate.  Family History:  The patient's family history is not on file.  Medical History: Past Medical History:  Diagnosis Date  . Depression   . Diabetes mellitus    type 2  . GERD (gastroesophageal reflux disease)   . Hypertension   . Hypoxemia   . Mental retardation   . Neuroleptic malignant syndrome   . Rhabdomyolysis   . Schizophrenia (HCC)   . Thyroid disease   . Vitamin D deficient osteomalacia     Surgical History: Past Surgical History:  Procedure Laterality Date  . COLONOSCOPY  2010   MANN     Medications:   Current Facility-Administered Medications:  .  acetaminophen  (TYLENOL ) tablet 1,000 mg, 1,000 mg, Oral, Q6H PRN, Segars, Jonathan, MD, 1,000 mg at 06/22/23 2139 .  albuterol  (PROVENTIL ) (2.5 MG/3ML) 0.083% nebulizer solution 2.5 mg, 2.5 mg, Nebulization, Q4H PRN, Sundil, Subrina, MD, 2.5 mg at 06/22/23 2135 .  amantadine  (SYMMETREL ) capsule 100 mg, 100 mg, Oral, BID, Segars, Dorn, MD, 100 mg at 06/25/23 0841 .  atorvastatin  (LIPITOR) tablet 10 mg, 10 mg, Oral, QHS, Segars, Jonathan, MD, 10 mg at 06/24/23 2120 .  bethanechol  (URECHOLINE ) tablet 25 mg, 25 mg, Oral, TID, Segars, Dorn, MD, 25 mg at 06/25/23 0841 .  clonazePAM  (KLONOPIN )  tablet 0.5 mg, 0.5 mg, Oral, BID PRN, Arlon Honey W, DO, 0.5 mg at 06/25/23 1158 .  diphenhydrAMINE  (BENADRYL ) capsule 25 mg, 25 mg, Oral, BID PRN, Arlon Honey ORN, DO, 25 mg at 06/25/23 9158 .  divalproex  (DEPAKOTE ) DR tablet 250 mg, 250 mg, Oral, TID, Segars, Dorn, MD, 250 mg at 06/25/23 0841 .  docusate sodium  (COLACE) capsule 100 mg, 100 mg, Oral, BID, Segars, Dorn, MD, 100 mg at 06/25/23 0841 .  ferrous sulfate  tablet 325 mg, 325 mg, Oral, Q breakfast, Segars, Jonathan, MD, 325 mg at 06/25/23  9158 .  fesoterodine  (TOVIAZ ) tablet 4 mg, 4 mg, Oral, QHS, Segars, Dorn, MD, 4 mg at 06/24/23 2132 .  gabapentin  (NEURONTIN ) capsule 300 mg, 300 mg, Oral, TID, Maree, Pratik D, DO, 300 mg at 06/25/23 9158 .  insulin  aspart (novoLOG ) injection 0-5 Units, 0-5 Units, Subcutaneous, QHS, Sundil, Subrina, MD .  insulin  aspart (novoLOG ) injection 0-6 Units, 0-6 Units, Subcutaneous, TID WC, Sundil, Subrina, MD, 1 Units at 06/24/23 1307 .  lactulose  (CHRONULAC ) 10 GM/15ML solution 10 g, 10 g, Oral, Daily PRN, Segars, Jonathan, MD .  LORazepam  (ATIVAN ) injection 2 mg, 2 mg, Intravenous, Q4H PRN, Arlon Carliss ORN, DO, 2 mg at 06/25/23 1333 .  melatonin tablet 6 mg, 6 mg, Oral, QHS PRN, Segars, Jonathan, MD, 6 mg at 06/24/23 2119 .  ondansetron  (ZOFRAN ) injection 4 mg, 4 mg, Intravenous, Q6H PRN, Segars, Jonathan, MD .  pantoprazole  (PROTONIX ) EC tablet 40 mg, 40 mg, Oral, BID, Segars, Dorn, MD, 40 mg at 06/25/23 0841 .  QUEtiapine  (SEROQUEL  XR) 24 hr tablet 50 mg, 50 mg, Oral, QHS **AND** [START ON 06/26/2023] QUEtiapine  (SEROQUEL ) tablet 25 mg, 25 mg, Oral, Daily, Lakeitha Basques, MD .  QUEtiapine  (SEROQUEL ) tablet 50 mg, 50 mg, Oral, Daily, Cowen Pesqueira, MD, 50 mg at 06/25/23 1333 .  sertraline  (ZOLOFT ) tablet 200 mg, 200 mg, Oral, QHS, Segars, Jonathan, MD, 200 mg at 06/24/23 2119  Allergies: No Known Allergies  Bently Wyss, MD, PGY-2

## 2023-06-25 NOTE — Plan of Care (Signed)

## 2023-06-25 NOTE — Plan of Care (Signed)
  Problem: Coping: Goal: Ability to adjust to condition or change in health will improve Outcome: Progressing   Problem: Fluid Volume: Goal: Ability to maintain a balanced intake and output will improve Outcome: Progressing   Problem: Metabolic: Goal: Ability to maintain appropriate glucose levels will improve Outcome: Progressing   Problem: Nutritional: Goal: Maintenance of adequate nutrition will improve Outcome: Progressing Goal: Progress toward achieving an optimal weight will improve Outcome: Progressing   Problem: Skin Integrity: Goal: Risk for impaired skin integrity will decrease Outcome: Progressing   Problem: Tissue Perfusion: Goal: Adequacy of tissue perfusion will improve Outcome: Progressing   Problem: Health Behavior/Discharge Planning: Goal: Ability to manage health-related needs will improve Outcome: Progressing   Problem: Clinical Measurements: Goal: Ability to maintain clinical measurements within normal limits will improve Outcome: Progressing Goal: Will remain free from infection Outcome: Progressing Goal: Diagnostic test results will improve Outcome: Progressing Goal: Respiratory complications will improve Outcome: Progressing Goal: Cardiovascular complication will be avoided Outcome: Progressing   Problem: Nutrition: Goal: Adequate nutrition will be maintained Outcome: Progressing   Problem: Coping: Goal: Level of anxiety will decrease Outcome: Progressing   Problem: Elimination: Goal: Will not experience complications related to bowel motility Outcome: Progressing Goal: Will not experience complications related to urinary retention Outcome: Progressing   Problem: Pain Managment: Goal: General experience of comfort will improve and/or be controlled Outcome: Progressing   Problem: Safety: Goal: Ability to remain free from injury will improve Outcome: Progressing   Problem: Skin Integrity: Goal: Risk for impaired skin integrity will  decrease Outcome: Progressing

## 2023-06-25 NOTE — Progress Notes (Signed)
 Physical Therapy Treatment Patient Details Name: Annette Martin MRN: [id number] DOB: 07-16-1955 Today's Date: 06/25/2023   History of Present Illness The pt is a 68 yo female presenting from ALF on 2/2 with AMS. Work up revealed: acute metabolic encephalopathy, influenza A, and dehydration. PMH includes: schizophrenia, DM II, HLD, HTN, hypothyroidism, and urinary incontinence.    PT Comments  The pt was agreeable to session initially, but presents with significant lethargy at this time (suspect due to medications). She did verbally agree to sitting EOB, but then did not show any attempt to assist with rolling or transition to sitting EOB. The pt required mod-maxA to maintain static sitting EOB and did not complete any reaching with UE to work on dynamic sitting balance. She was able to tolerate sitting without issue, but assisted back to sitting due to persistent lethargy despite change in position and max stimulation. Pt will continue to benefit from skilled PT acutely to progress activity tolerance and OOB mobility, continue to recommend post-acute rehab <3hours/day.     If plan is discharge home, recommend the following: Two people to help with walking and/or transfers;Two people to help with bathing/dressing/bathroom;Assistance with cooking/housework;Direct supervision/assist for medications management;Direct supervision/assist for financial management;Assist for transportation;Help with stairs or ramp for entrance;Supervision due to cognitive status   Can travel by private vehicle     No  Equipment Recommendations  None recommended by PT (per pt, has WC and lift at ALF)    Recommendations for Other Services       Precautions / Restrictions Precautions Precautions: Fall Restrictions Weight Bearing Restrictions Per Provider Order: No     Mobility  Bed Mobility Overal bed mobility: Needs Assistance Bed Mobility: Rolling, Sit to Supine, Supine to Sit Rolling: Total assist    Supine to sit: Total assist Sit to supine: Total assist   General bed mobility comments: attemtped to facilitate rolling but pt with little attempt to assist despite max cues and increased processing time. once sitting EOB pt needing mod-maxA to maintain static sitting    Transfers                   General transfer comment: declined due to mod-maxA to maintain static sitting and pt with limited ability to follow commands or maintain alertness    Ambulation/Gait               General Gait Details: unable to step feet fwd or side in standing      Balance Overall balance assessment: Needs assistance Sitting-balance support: Single extremity supported, Feet supported Sitting balance-Leahy Scale: Poor Sitting balance - Comments: needed mod A to maxA to sit due to strong posterior lean. pt did not move UE to perform reaching activities Postural control: Posterior lean                                  Cognition Arousal: Lethargic, Obtunded Behavior During Therapy: Flat affect Overall Cognitive Status: No family/caregiver present to determine baseline cognitive functioning                                 General Comments: pt lethargic-obtunded, suspect due to medications. attempting to answer questions but speech is mumbled and despite pt attempts to repeat I was unable to discern what she is saying. followed <10% of commands  Exercises General Exercises - Lower Extremity Long Arc Quad: AROM, Both, 10 reps, Seated (partial ROM) Heel Raises: AROM, Both, 10 reps, Seated (partial ROM)    General Comments General comments (skin integrity, edema, etc.): VSS on RA      Pertinent Vitals/Pain Pain Assessment Pain Assessment: No/denies pain Pain Intervention(s): Monitored during session     PT Goals (current goals can now be found in the care plan section) Acute Rehab PT Goals Patient Stated Goal: to get up PT Goal Formulation:  With patient Time For Goal Achievement: 07/05/23 Potential to Achieve Goals: Fair Progress towards PT goals: Not progressing toward goals - comment (increased lethargy today)    Frequency    Min 1X/week       AM-PAC PT 6 Clicks Mobility   Outcome Measure  Help needed turning from your back to your side while in a flat bed without using bedrails?: Total Help needed moving from lying on your back to sitting on the side of a flat bed without using bedrails?: Total Help needed moving to and from a bed to a chair (including a wheelchair)?: Total Help needed standing up from a chair using your arms (e.g., wheelchair or bedside chair)?: Total Help needed to walk in hospital room?: Total Help needed climbing 3-5 steps with a railing? : Total 6 Click Score: 6    End of Session Equipment Utilized During Treatment: Gait belt Activity Tolerance: Patient limited by lethargy Patient left: in bed;with call bell/phone within reach;with bed alarm set Nurse Communication: Mobility status;Need for lift equipment (stedy) PT Visit Diagnosis: Unsteadiness on feet (R26.81);Muscle weakness (generalized) (M62.81)     Time: 8641-8580 PT Time Calculation (min) (ACUTE ONLY): 21 min  Charges:    $Therapeutic Activity: 8-22 mins PT General Charges $$ ACUTE PT VISIT: 1 Visit                     Izetta Call, PT, DPT   Acute Rehabilitation Department Office [phone] Secure Chat Communication Preferred   Izetta JULIANNA Call 06/25/2023, 3:07 PM

## 2023-06-25 NOTE — Progress Notes (Signed)
  Progress Note   Patient: Annette Martin FMW:[id number] DOB: 1956-04-07 DOA: 06/20/2023     4 DOS: the patient was seen and examined on 06/25/2023   Brief hospital course: No notes on file Annette Martin is a 68 y.o. female with hx of schizophrenia, diabetes, hyperlipidemia, hypothyroidism, urinary incontinence, who was brought in from [facility] for altered mental status.  She was admitted for acute metabolic encephalopathy in the setting of influenza A infection as well as some dehydration.  She is noted to have some bradykinesia with cogwheel rigidity and resting tremor suspicious for EPS.  EEG performed with results pending.  Psychiatry consulted for assistance in management of medications.  PT recommending SNF.   Assessment and Plan: No notes have been filed under this hospital service. Service: Hospitalist  Encephalopathy acute, metabolic with bradykinesia and resting tremor -Suspect her encephalopathy is multifactorial related to her influenza A infection, dehydration, medication effects.  Appreciate psychiatry for adjustment of antipsychotic medications.  Pt appears to be improving.    Acute Influenza A  - No longer requiring nasal cannula, breathing comfortably on room air.  Completed 5 days of Tamiflu .  Albuterol  prn, incentive spirometer.  Supportive care.  Appears to be resolving.   Dehydration -Appears to be resolving after IV fluid hydration.  Encourage PO intake.   Deconditioning -PT evaluation recommending SNF   History of schizophrenia/intellectual disability/depression -Appreciate evaluation by psychiatry.  Appreciate their input on medication regimen.  Per psych, increasing quetiapine  3 times daily from 25/25/50 to 25/50/50.  Diabetes: Home regimen is metformin .  Glucose within goal range can add SSI if runs high. Neuropathy: Gabapentin  resumed HLD: Continue home atorvastatin  Hypothyroidism: TSH 3.4 Urinary incontinence: Continue home bethanechol  3 times daily  and fesoteridine nightly GERD: home PPI   Goals of care - Working closely with case management on disposition planning.  Patient has been accepted at [facility] however awaiting authorization.     Subjective: Pt resting comfortably, states she feels okay.  Clinically appears improved from yesterday.  Denies fever, worsening shortness of breath, vomiting, chest pain, abdominal pain.    Physical Exam: Vitals:   06/24/23 1508 06/24/23 1930 06/25/23 0500 06/25/23 0836  BP: 127/71 120/75 134/70 (!) 103/47  Pulse: 83 80 68 68  Resp: 16 19 18 16   Temp: 98 F (36.7 C) 98 F (36.7 C) 97.6 F (36.4 C)   TempSrc: Oral  Axillary   SpO2: 96% 95% 95% 99%   Physical Exam GENERAL:  Alert, pleasant, malaise  HEENT:  EOMI CARDIOVASCULAR:  RRR, no murmurs appreciated RESPIRATORY:  Clear to auscultation, poor air movement GASTROINTESTINAL:  Soft, nontender, nondistended EXTREMITIES:  No LE edema bilaterally NEURO:  No new focal deficits appreciated SKIN:  No rashes noted PSYCH:  flat mood and affect   Data Reviewed:  There are no new results to review at this time.  Family Communication:   Disposition: Status is: Inpatient Remains inpatient appropriate because: Ongoing hypoxia from influenza  Planned Discharge Destination: Skilled nursing facility    Time spent: 35 minutes  Author: Carliss LELON Canales, DO 06/25/2023 12:59 PM  For on call review [email or web address].

## 2023-06-25 NOTE — TOC Progression Note (Addendum)
 Transition of Care [facility]) - Progression Note    Patient Details  Name: Annette Martin MRN: [id number] Date of Birth: 13-Dec-1955  Transition of Care [facility] CM/SW Contact  Lauraine FORBES Saa, LCSW Phone Number: 06/25/2023, 8:58 AM  Clinical Narrative:     8:58 AM Annette Martin called CSW to follow up on CSW message. CSW informed Annette Martin of current SNF options ([facility], [facility], [facility],  [facility]). Annette Martin expressed preference in [facility] due to location. CSW offered to email SNF options and their Medicare ratings. Annette Martin accepted CSW offer and suggested CSW to email patient's cousin, Alm ([email or web address]). CSW emailed SNF options with their Medicare ratings.  12:59 PM Patient's brother, Annette Martin, called CSW to accept bed offer at [facility]. Patient's cousin, Nicholaus, also emailed CS[facility] to [facility]. CSW relayed acceptance to SNF, who informed CSW that patient would be in a shared room. CSW called Alm to relay information (unable to directly call Annette Martin at this time). Alm expressed understanding and consented to shared room. Alm informed CSW that he would be able to relay the information to Annette Martin upon their next call.  1:09 PM Insurance authorization request for SNF pending (ID [id number]).    Barriers to Discharge: Continued Medical Work up  Expected Discharge Plan and Services       Living arrangements for the past 2 months: Assisted Living Facility                                       Social Determinants of Health (SDOH) Interventions SDOH Screenings   Food Insecurity: Patient Unable To Answer (06/23/2023)  Housing: Patient Unable To Answer (06/23/2023)  Transportation Needs: Patient Unable To Answer (06/23/2023)  Utilities: Patient Unable To Answer (06/23/2023)  Alcohol Screen: Low Risk  (04/05/2018)  Social Connections: Patient Unable To Answer (06/23/2023)  Tobacco Use: Low Risk  (06/12/2023)    Readmission Risk Interventions      No data to display

## 2023-06-26 DIAGNOSIS — G259 Extrapyramidal and movement disorder, unspecified: Secondary | ICD-10-CM

## 2023-06-26 DIAGNOSIS — T43505A Adverse effect of unspecified antipsychotics and neuroleptics, initial encounter: Secondary | ICD-10-CM

## 2023-06-26 LAB — GLUCOSE, CAPILLARY
Glucose-Capillary: 128 mg/dL — ABNORMAL HIGH (ref 70–99)
Glucose-Capillary: 127 mg/dL — ABNORMAL HIGH (ref 70–99)
Glucose-Capillary: 149 mg/dL — ABNORMAL HIGH (ref 70–99)
Glucose-Capillary: 162 mg/dL — ABNORMAL HIGH (ref 70–99)

## 2023-06-26 NOTE — Plan of Care (Signed)

## 2023-06-26 NOTE — Progress Notes (Signed)
  Progress Note   Patient: Annette Martin FMW:[id number] DOB: 1956/05/04 DOA: 06/20/2023     5 DOS: the patient was seen and examined on 06/26/2023   Brief hospital course: No notes on file Annette Martin is a 68 y.o. female with hx of schizophrenia, diabetes, hyperlipidemia, hypothyroidism, urinary incontinence, who was brought in from [facility] for altered mental status.  She was admitted for acute metabolic encephalopathy in the setting of influenza A infection as well as some dehydration.  She is noted to have some bradykinesia with cogwheel rigidity and resting tremor suspicious for EPS.  EEG performed with results pending.  Psychiatry consulted for assistance in management of medications.  PT recommending SNF.   Assessment and Plan: No notes have been filed under this hospital service. Service: Hospitalist  Encephalopathy acute, metabolic with bradykinesia and resting tremor -Suspect her encephalopathy is multifactorial related to her influenza A infection, dehydration, medication effects.  Appreciate psychiatry for adjustment of antipsychotic medications.  Pt appears to be improving.    Acute Influenza A  - No longer requiring nasal cannula, breathing comfortably on room air.  Completed 5 days of Tamiflu .  Albuterol  prn, incentive spirometer.  Supportive care.  Appears to be resolved.   Dehydration -Appears to be resolving after IV fluid hydration.  Encourage PO intake.   Deconditioning -PT evaluation recommending SNF   History of schizophrenia/intellectual disability/depression -Appreciate evaluation by psychiatry.  Appreciate their input on medication regimen.  Per psych, increased quetiapine  3 times daily from 25/25/50 to 25/50/50.  Diabetes: Home regimen is metformin .  Glucose within goal range can add SSI if runs high. Neuropathy: Gabapentin  resumed HLD: Continue home atorvastatin  Hypothyroidism: TSH 3.4 Urinary incontinence: Continue home bethanechol  3 times daily  and fesoteridine nightly GERD: home PPI   Goals of care - Working closely with case management on disposition planning.  Patient has been accepted at [facility] however awaiting authorization.     Subjective: Pt resting comfortably morning.  States she feels fine.  Denies fever, worsening shortness of breath, vomiting, chest pain, abdominal pain.  Flat affect.  Physical Exam: Vitals:   06/25/23 1646 06/25/23 2029 06/26/23 0505 06/26/23 0712  BP: 132/61 128/63 (!) 140/61 (!) 141/67  Pulse: 74 70 68 71  Resp: 16 18 18 16   Temp: 98.1 F (36.7 C) (!) 97.5 F (36.4 C) 97.7 F (36.5 C) 98 F (36.7 C)  TempSrc:   Axillary Oral  SpO2: 97% 96% 98% 97%   Physical Exam GENERAL:  Alert, pleasant, malaise  HEENT:  EOMI CARDIOVASCULAR:  RRR, no murmurs appreciated RESPIRATORY:  Clear to auscultation, poor air movement GASTROINTESTINAL:  Soft, nontender, nondistended EXTREMITIES:  No LE edema bilaterally NEURO:  No new focal deficits appreciated SKIN:  No rashes noted PSYCH:  flat mood and affect   Data Reviewed:  There are no new results to review at this time.  Family Communication:   Disposition: Status is: Inpatient Remains inpatient appropriate because: Ongoing hypoxia from influenza  Planned Discharge Destination: Skilled nursing facility    Time spent: 31 minutes  Author: Carliss LELON Canales, DO 06/26/2023 12:11 PM  For on call review [email or web address].

## 2023-06-26 NOTE — Consult Note (Signed)
 [facility] Consult Follow-up  Patient Name: .Annette Martin  MRN: [id number]  DOB: November 05, 1955  Consult Order details:  Orders (From admission, onward)     Start     Ordered   06/22/23 0002  IP CONSULT TO PSYCHIATRY       Ordering Provider: Sundil, Subrina, MD  Provider:  (Not yet assigned)  Question Answer Comment  Location [facility]   Reason for Consult? Extrapyramidal syndrome side effect of Haldol .  Need medication readjustment.      06/22/23 0001             Mode of Visit: In person    Psychiatry Consult Evaluation  Service Date: June 26, 2023 LOS:  LOS: 5 days  Chief Complaint: Altered Mental Status from Flu  Primary Psychiatric Diagnoses  Drug-induced parkinsonism, extra pyramidal symptoms 2.  Schizophrenia 3.  Intellectual Disability 4.  Behavioral problems  Assessment  Annette Martin is a 68 y.o. female admitted: Medically for 06/20/2023  7:02 PM for encephalopathy. She carries the psychiatric diagnoses of schizophrenia, intellectual disability, depression and has a past medical history of  thyroid disease, hypertension, T2DM, Rhabdomyolisis, Vitamin D deficient osteomalacia,.   Her current presentation of altered mental status, mixed rigidity, asymmetric tremor, pill rolling behavior and diaphoresis is most consistent with drug-induced parkinsonism in the setting of antipsychotic use She meets criteria for drug-induced parkinsonism based on history by patient, chart-reviewed history, physical presentation. Current outpatient psychotropic medications include haloperidol  5 mg TID, depakote  250 DR TID, gabapentin  300 mg TID, amantadine  100 mg BID, clonazepam  0.5 mg TID, and diphenhydramine  25 mg TID and historically she has had a modest response to these medications. She was compliant with medications prior to admission as evidenced by facility records. On initial examination, patient demonstrated good self control, but in subsequent  interviews has shown low frustration tolerance and emotional lability. Her life circumstances are challenging and it is regrettable that there is not more that we can offer her. On follow-up interview, patient actively hallucinating that there is a doll at the end of her bed. Will increase afternoon seroquel . Please see plan below for detailed recommendations. We will continue to follow.  06/26/2023: Patient seen face to face in her hospital room.She is awake, alert and oriented x 2. Patient reports she slept better last night and feels safe in the hospital. She denies auditory hallucinations or paranoia, but reports ongoing bilateral upper extremities tremors.On examination, there is bilateral mixed rigidity of the upper extremities, with asymmetric tremors. We will continue to adjust current medications to avoid worsening of symptoms.   Diagnoses:  Active Hospital problems: Principal Problem:   Encephalopathy Active Problems:   Influenza A   Dehydration   Acute metabolic encephalopathy    Plan   ## Psychiatric Medication Recommendations:  -- Continue clonazepam  0.5 mg TID PRN to BID PRN --Continue diphenhydramine  25 mg BID PRN -- Continue sertraline  200 mg at bedtime  -- Continue divalproex  250 mg TID  -- Continue gabapentin  300 mg TID -- Continue amantadine  100 mg BID -- Hold haldol  except for extreme agitation     Patient is on a high number of medications with possible psychotropic effects that can also cause delirium or her parkinsonism-like symptoms. She would benefit from a significant streamlining of her outpatient medications. For now, the haldol  seems to be the most likely culprit for her extrapyramidal symptoms. Patient would be better served with a second generation antipsychotic. Could also consider reducing the zoloft  with  the treatment of the quetiapine .  -- Continue quetiapine  50 mg at bedtime for schizophrenia,  -- Continue quetiapine  25 mg 0800, increase quetiapine   50mg  1400 for schizophrenia  ## Medical Decision Making Capacity: Patient has a guardian and has thus been adjudicated incompetent; please involve patients guardian in medical decision making Patient's primary guardian is in jail.  ## Further Work-up:  -- Added on Creatinine Kinase to previous collection. Level came back elevated at 394. While pt on Qtc prolonging medications, please monitor & replete K+ to 4 and Mg2+ to 2 -- most recent EKG on 06/20/2023 had QtC of 443 ms -- Pertinent labwork reviewed earlier this admission includes: venous blood gases, CMP, CBC, respiratory panel, UA   ## Disposition:-- There are no psychiatric contraindications to discharge at this time  ## Behavioral / Environmental: -Delirium Precautions: Delirium Interventions for Nursing and Staff: - RN to open blinds every AM. - To Bedside: Glasses, hearing aide, and pt's own shoes. Make available to patients. when possible and encourage use. - Encourage po fluids when appropriate, keep fluids within reach. - OOB to chair with meals. - Passive ROM exercises to all extremities with AM & PM care. - RN to assess orientation to person, time and place QAM and PRN. - Recommend extended visitation hours with familiar family/friends as feasible. - Staff to minimize disturbances at night. Turn off television when pt asleep or when not in use.   ## Safety and Observation Level:  - Based on my clinical evaluation, I estimate the patient to be at low risk of self harm in the current setting. - At this time, we recommend  routine. This decision is based on my review of the chart including patient's history and current presentation, interview of the patient, mental status examination, and consideration of suicide risk including evaluating suicidal ideation, plan, intent, suicidal or self-harm behaviors, risk factors, and protective factors. This judgment is based on our ability to directly address suicide risk, implement suicide prevention  strategies, and develop a safety plan while the patient is in the clinical setting. Please contact our team if there is a concern that risk level has changed.  CSSR Risk Category:   Suicide Risk Assessment: Patient has following modifiable risk factors for suicide: none which we are addressing by continuing with planned management. Patient has following non-modifiable or demographic risk factors for suicide: psychiatric hospitalization Patient has the following protective factors against suicide: Access to outpatient mental health care  Thank you for this consult request. Recommendations have been communicated to the primary team.  We will follow at this time.   Jan DELENA Donath, MD        History of Present Illness  Relevant Aspects of [facility] Hospital Course:  Admitted on 06/20/2023 for encephalopathy in the setting of Influenza A. They are currently inpatient being treated for the flu, when team noticed increased rigidity, diaphoresis, and increased tremor.   Patient Report:  Patient was seen laying in her bed. She reports get that doll off my bed. When asked where it is, she points to an empty space at the end of her bed. Tongue is still noted to be intermittently sticking out, intermittent tremor noted. She denies SI/HI. She denies any auditory hallucinations. Reports good sleep and appetite.   Psych ROS:  Depression: denied symptoms of depression other than sleep changes and very recent poor mood due to illness Anxiety: Denies Mania (lifetime and current): Denies Psychosis: (lifetime and current): Did not understand question.  Collateral information:  Pt has legal guardian, but he is currently in prison.   Will consult facility closer to discharge.   Review of Systems  Constitutional:  Positive for chills, diaphoresis, fever and malaise/fatigue. Negative for weight loss.  HENT:  Negative for hearing loss.   Respiratory:  Positive for cough, sputum production and shortness of  breath.   Cardiovascular:  Negative for chest pain.  Gastrointestinal:  Negative for abdominal pain.  Musculoskeletal:  Positive for myalgias.  Neurological:  Positive for weakness. Negative for headaches.  Psychiatric/Behavioral:  Positive for depression. Negative for hallucinations, memory loss, substance abuse and suicidal ideas. The patient is nervous/anxious. The patient does not have insomnia.      Psychiatric and Social History  Psychiatric History:  Information collected from chart   Prev Dx/Sx: Schizophrenia, IDD. Current Psych Provider: [facility] Meds (current):  Current Outpatient Medications  Medication Instructions  . amantadine  (SYMMETREL ) 100 mg, Oral, 2 times daily  . atorvastatin  (LIPITOR) 10 mg, Oral, Daily at bedtime  . bethanechol  (URECHOLINE ) 25 mg, Oral, 3 times daily  . clonazePAM  (KLONOPIN ) 0.5 mg, Oral, 3 times daily  . divalproex  (DEPAKOTE ) 250 mg, Oral, 3 times daily  . docusate sodium  (COLACE) 200 mg, Oral, 2 times daily  . ferrous sulfate  325 mg, Oral, Daily with breakfast  . fesoterodine  (TOVIAZ ) 4 mg, Oral, Daily at bedtime  . gabapentin  (NEURONTIN ) 300 mg, Oral, 3 times daily  . haloperidol  (HALDOL ) 5 mg, Oral, 2 times daily  . lactulose  (CHRONULAC ) 20 g, Oral, Daily  . lidocaine  (LIDODERM ) 5 % 1 patch, Transdermal, Every 24 hours, Remove & Discard patch within 12 hours or as directed by MD  . losartan  (COZAAR ) 25 mg, Oral, Daily  . Magnesium  400 MG CAPS 1 tablet, Oral, 2 times daily  . meclizine (ANTIVERT) 25 mg, 3 times daily PRN  . meloxicam (MOBIC) 7.5 mg, Oral, Daily  . metFORMIN  (GLUCOPHAGE ) 1,000 mg, Oral, 2 times daily with meals  . ondansetron  (ZOFRAN ) 4 mg, Every 6 hours PRN  . pantoprazole  (PROTONIX ) 40 mg, Oral, 2 times daily  . sertraline  (ZOLOFT ) 200 mg, Oral, Daily at bedtime  Previous Med Trials: ziprasidone , risperidone , hydroxyzine , quetiapine , temazepam , valbenazine  tosylate Therapy: none  Prior Psych Hospitalization: 2019   Prior Self Harm: Unable to assess. Prior Violence: Unable to assess.  Family Psych History: No history on file, Unable to assess from patient reports. Family Hx suicide: No history on file, Unable to assess from patient reports.  Social History:  Developmental Hx: Intellectually disabled. Educational Hx: No history on file, Unable to assess from patient reports. Occupational Hx: disabled Legal Hx: chart review does not provide legal history, patient not able to communicate. Living Situation: Patient lives in assisted living facility [facility]  [address]  [facility] [place] [address]  Spiritual Hx: no Access to weapons/lethal means: Denies (per cousin, Osa Fogarty)   Substance History Alcohol: Denies  Type of alcohol Denies  Last Drink Denies  Number of drinks per day Denies  Tobacco: Denies  Illicit drugs: Denies  Prescription drug abuse: Denies  Rehab hx: Denies   Exam Findings  Physical Exam:  Vital Signs:  Temp:  [97.5 F (36.4 C)-98.1 F (36.7 C)] 98 F (36.7 C) (02/08 0712) Pulse Rate:  [68-74] 71 (02/08 0712) Resp:  [16-18] 16 (02/08 0712) BP: (128-141)/(61-67) 141/67 (02/08 0712) SpO2:  [96 %-98 %] 97 % (02/08 0712) Blood pressure (!) 141/67, pulse 71, temperature 98 F (36.7 C), temperature source Oral, resp. rate 16, SpO2  97%. There is no height or weight on file to calculate BMI.  Physical Exam Vitals and nursing note reviewed.  Constitutional:      Appearance: She is ill-appearing and diaphoretic.  HENT:     Head: Normocephalic and atraumatic.     Nose: Congestion present.  Skin:    General: Skin is warm.  Neurological:     Mental Status: She is alert. Mental status is at baseline.  Psychiatric:        Attention and Perception: Attention normal. She perceives visual hallucinations.        Speech: Speech normal.        Behavior: Behavior is cooperative.        Thought Content: Thought content is not paranoid or delusional. Thought content  does not include homicidal or suicidal ideation.        Cognition and Memory: Memory normal. Cognition is impaired.    Mental Status Exam: General Appearance: Casual  Orientation:  Other:  Name, place, year  Memory:  Immediate;   Good Remote;   Fair  Concentration:  Concentration: Good  Recall:  NA  Attention  Good  Eye Contact:  Good  Speech:  Normal Rate  Language:  Fair - speech impediment  Volume:  Normal  Mood: Get that doll out of my bed  Affect:  Appropriate  Thought Process:  Coherent and Linear  Thought Content:  WDL  Suicidal Thoughts:  No  Homicidal Thoughts:  No  Judgement:  Intact  Insight:  Shallow  Psychomotor Activity:  EPS, Increased, Mannerisms, Restlessness, and Tremor - Minimal left-sided tremor. The right sided tremor is still present, but is more consistent with an intention tremor. Tongue intermittently sticking out  Akathisia:  Yes  Fund of Knowledge:  Fair      Assets:  Desire for Improvement Leisure Time Vocational/Educational  Cognition:  Impaired,  Mild  ADL's:  Impaired  AIMS (if indicated):        Other History   These have been pulled in through the EMR, reviewed, and updated if appropriate.  Family History:  The patient's family history is not on file.  Medical History: Past Medical History:  Diagnosis Date  . Depression   . Diabetes mellitus    type 2  . GERD (gastroesophageal reflux disease)   . Hypertension   . Hypoxemia   . Mental retardation   . Neuroleptic malignant syndrome   . Rhabdomyolysis   . Schizophrenia (HCC)   . Thyroid disease   . Vitamin D deficient osteomalacia     Surgical History: Past Surgical History:  Procedure Laterality Date  . COLONOSCOPY  2010   MANN     Medications:   Current Facility-Administered Medications:  .  acetaminophen  (TYLENOL ) tablet 1,000 mg, 1,000 mg, Oral, Q6H PRN, Segars, Jonathan, MD, 1,000 mg at 06/22/23 2139 .  albuterol  (PROVENTIL ) (2.5 MG/3ML) 0.083% nebulizer  solution 2.5 mg, 2.5 mg, Nebulization, Q4H PRN, Sundil, Subrina, MD, 2.5 mg at 06/22/23 2135 .  amantadine  (SYMMETREL ) capsule 100 mg, 100 mg, Oral, BID, Segars, Dorn, MD, 100 mg at 06/26/23 9056 .  atorvastatin  (LIPITOR) tablet 10 mg, 10 mg, Oral, QHS, Segars, Dorn, MD, 10 mg at 06/25/23 2110 .  bethanechol  (URECHOLINE ) tablet 25 mg, 25 mg, Oral, TID, Segars, Dorn, MD, 25 mg at 06/26/23 9057 .  clonazePAM  (KLONOPIN ) tablet 0.5 mg, 0.5 mg, Oral, BID PRN, Arlon Honey W, DO, 0.5 mg at 06/26/23 1418 .  diphenhydrAMINE  (BENADRYL ) capsule 25 mg, 25 mg, Oral, BID  PRN, Arlon Carliss ORN, DO, 25 mg at 06/26/23 1234 .  divalproex  (DEPAKOTE ) DR tablet 250 mg, 250 mg, Oral, TID, Segars, Dorn, MD, 250 mg at 06/26/23 9057 .  docusate sodium  (COLACE) capsule 100 mg, 100 mg, Oral, BID, Segars, Dorn, MD, 100 mg at 06/26/23 9056 .  ferrous sulfate  tablet 325 mg, 325 mg, Oral, Q breakfast, Segars, Dorn, MD, 325 mg at 06/26/23 9056 .  fesoterodine  (TOVIAZ ) tablet 4 mg, 4 mg, Oral, QHS, Segars, Dorn, MD, 4 mg at 06/25/23 2110 .  gabapentin  (NEURONTIN ) capsule 300 mg, 300 mg, Oral, TID, Maree, Pratik D, DO, 300 mg at 06/26/23 9056 .  insulin  aspart (novoLOG ) injection 0-5 Units, 0-5 Units, Subcutaneous, QHS, Sundil, Subrina, MD .  insulin  aspart (novoLOG ) injection 0-6 Units, 0-6 Units, Subcutaneous, TID WC, Sundil, Subrina, MD, 1 Units at 06/24/23 1307 .  lactulose  (CHRONULAC ) 10 GM/15ML solution 10 g, 10 g, Oral, Daily PRN, Segars, Jonathan, MD .  LORazepam  (ATIVAN ) injection 2 mg, 2 mg, Intravenous, Q4H PRN, Arlon Carliss ORN, DO, 2 mg at 06/25/23 2111 .  melatonin tablet 6 mg, 6 mg, Oral, QHS PRN, Segars, Jonathan, MD, 6 mg at 06/25/23 2109 .  ondansetron  (ZOFRAN ) injection 4 mg, 4 mg, Intravenous, Q6H PRN, Segars, Dorn, MD .  pantoprazole  (PROTONIX ) EC tablet 40 mg, 40 mg, Oral, BID, Segars, Dorn, MD, 40 mg at 06/26/23 0943 .  QUEtiapine  (SEROQUEL  XR) 24 hr tablet 50 mg, 50 mg,  Oral, QHS, 50 mg at 06/25/23 2110 **AND** QUEtiapine  (SEROQUEL ) tablet 25 mg, 25 mg, Oral, Daily, Chien, Stephanie, MD, 25 mg at 06/26/23 9056 .  QUEtiapine  (SEROQUEL ) tablet 50 mg, 50 mg, Oral, Daily, Chien, Stephanie, MD, 50 mg at 06/26/23 9056 .  sertraline  (ZOLOFT ) tablet 200 mg, 200 mg, Oral, QHS, Segars, Jonathan, MD, 200 mg at 06/25/23 2110  Allergies: No Known Allergies  Ronin Crager A Elmore Hyslop, MD

## 2023-06-26 NOTE — TOC Progression Note (Addendum)
 Transition of Care [facility]) - Progression Note    Patient Details  Name: Annette Martin MRN: [id number] Date of Birth: May 10, 1956  Transition of Care [facility]) CM/SW Contact  Gwenn Frieze [place], [place] Phone Number: 06/26/2023, 11:23 AM  Clinical Narrative: Per [facility], SNF auth remains pending at this time and has been sent for medical review. Will provide updates as available.   Frieze Gwenn, MSW, LCSW [phone] (coverage)          Barriers to Discharge: Continued Medical Work up  Expected Discharge Plan and Services       Living arrangements for the past 2 months: Assisted Living Facility                                       Social Determinants of Health (SDOH) Interventions SDOH Screenings   Food Insecurity: Patient Unable To Answer (06/23/2023)  Housing: Patient Unable To Answer (06/23/2023)  Transportation Needs: Patient Unable To Answer (06/23/2023)  Utilities: Patient Unable To Answer (06/23/2023)  Alcohol Screen: Low Risk  (04/05/2018)  Social Connections: Patient Unable To Answer (06/23/2023)  Tobacco Use: Low Risk  (06/12/2023)    Readmission Risk Interventions     No data to display

## 2023-06-26 NOTE — Progress Notes (Signed)
 Speech Language Pathology Treatment: Dysphagia  Patient Details Name: Annette Martin MRN: [id number] DOB: 05-05-1956 Today's Date: 06/26/2023 Time: 1255-1310 SLP Time Calculation (min) (ACUTE ONLY): 15 min  Assessment / Plan / Recommendation Clinical Impression  Patient seen by SLP for skilled treatment focused on dysphagia goals. When SLP entered room, patient had meal tray in front of her and had eaten approximately 75% of food on plate. She did request help and wanted to eat some pudding and have a ginger ale. Although she requests help holding cup, she is capable of holding cup and bringing straw to her lips to drink. She has not demonstrated adequate ability to self feed with utensils. SLP did observe delayed coughing intermittently as had been observed in prior sessions. Patient appears to be tolerating current PO diet of Dys 3(mechanical soft) solids and thin liquids. Delayed coughing suspected to be from her GERD symptoms. SLP recommending continued supervision with meals, watching for impulsive PO intake. SLP to s/o at this time and recommend f/u with SLP at SNF to ensure continued PO toleration.   HPI HPI: Patient is a 68 y.o. female with PMH: schizophrenia, DM, HLD, hypothyroidism, urinary incontinence who was brought to hospital from [facility] for AMS on 06/20/23 In ED, she was somnolent, CXR did not show evidence of active pulmonary disease, CT head and MRI brain both negatie for acute intracranial abnormality. SLP swallow evaluation ordered secondary to patient with excessive coughing with PO intake.      SLP Plan  All goals met      Recommendations for follow up therapy are one component of a multi-disciplinary discharge planning process, led by the attending physician.  Recommendations may be updated based on patient status, additional functional criteria and insurance authorization.    Recommendations  Diet recommendations: Dysphagia 3 (mechanical soft);Thin  liquid Medication Administration: Whole meds with puree Supervision: Full supervision/cueing for compensatory strategies;Staff to assist with self feeding Compensations: Slow rate;Small sips/bites Postural Changes and/or Swallow Maneuvers: Seated upright 90 degrees                  Oral care BID   Frequent or constant Supervision/Assistance Dysphagia, oropharyngeal phase (R13.12)     All goals met      Annette IVAR Blase, MA, CCC-SLP Speech Therapy

## 2023-06-26 NOTE — Plan of Care (Signed)
 Patient A/Ox3. No complaints of pain. X1 vomiting episode right after breakfast. Patient having great appetite otherwise. Patient verbalized wanting to go back to her home. Informed her it is being worked out by dietitian. Problem: Education: Goal: Ability to describe self-care measures that may prevent or decrease complications (Diabetes Survival Skills Education) will improve Outcome: Progressing   Problem: Fluid Volume: Goal: Ability to maintain a balanced intake and output will improve Outcome: Progressing   Problem: Metabolic: Goal: Ability to maintain appropriate glucose levels will improve Outcome: Progressing   Problem: Nutritional: Goal: Maintenance of adequate nutrition will improve Outcome: Progressing   Problem: Tissue Perfusion: Goal: Adequacy of tissue perfusion will improve Outcome: Progressing   Problem: Activity: Goal: Risk for activity intolerance will decrease Outcome: Progressing   Problem: Nutrition: Goal: Adequate nutrition will be maintained Outcome: Progressing

## 2023-06-27 LAB — GLUCOSE, CAPILLARY
Glucose-Capillary: 126 mg/dL — ABNORMAL HIGH (ref 70–99)
Glucose-Capillary: 192 mg/dL — ABNORMAL HIGH (ref 70–99)
Glucose-Capillary: 97 mg/dL (ref 70–99)
Glucose-Capillary: 206 mg/dL — ABNORMAL HIGH (ref 70–99)

## 2023-06-27 MED ORDER — QUETIAPINE FUMARATE 25 MG PO TABS
50.0000 mg | ORAL_TABLET | Freq: Every day | ORAL | Status: DC
  Administered 2023-06-28: 50 mg via ORAL
  Filled 2023-06-27: qty 2

## 2023-06-27 NOTE — Plan of Care (Signed)

## 2023-06-27 NOTE — Progress Notes (Signed)
  Progress Note   Patient: Annette Martin FMW:[id number] DOB: [birth year] DOA: 06/20/2023     6 DOS: the patient was seen and examined on 06/27/2023   Brief hospital course: No notes on file Annette Martin is a 68 y.o. female with hx of schizophrenia, diabetes, hyperlipidemia, hypothyroidism, urinary incontinence, who was brought in from [facility] for altered mental status.  She was admitted for acute metabolic encephalopathy in the setting of influenza A infection as well as some dehydration.  She is noted to have some bradykinesia with cogwheel rigidity and resting tremor suspicious for EPS.  EEG performed with results pending.  Psychiatry consulted for assistance in management of medications.  PT recommending SNF.   Assessment and Plan: No notes have been filed under this hospital service. Service: Hospitalist  Encephalopathy acute, metabolic with bradykinesia and resting tremor -Suspect her encephalopathy is multifactorial related to her influenza A infection, dehydration, medication effects.  Appreciate psychiatry for adjustment of antipsychotic medications.  Pt appears to be improving.    Acute Influenza A  - No longer requiring nasal cannula, breathing comfortably on room air.  Completed 5 days of Tamiflu .  Albuterol  prn, incentive spirometer.  Supportive care.  Appears to be resolved.   Dehydration -Appears to be resolving after IV fluid hydration.  Encourage PO intake.   Deconditioning -PT evaluation recommending SNF   History of schizophrenia/intellectual disability/depression -Appreciate evaluation by psychiatry.  Appreciate their input on medication regimen.  Per psych, increased quetiapine  3 times daily from 25/25/50 to 25/50/50.  Diabetes: Home regimen is metformin .  Glucose within goal range can add SSI if runs high. Neuropathy: Gabapentin  resumed HLD: Continue home atorvastatin  Hypothyroidism: TSH 3.4 Urinary incontinence: Continue home bethanechol  3 times daily  and fesoteridine nightly GERD: home PPI   Goals of care - Working closely with case management on disposition planning.  Patient has been accepted at [facility] however awaiting authorization.     Subjective: Pt resting comfortably morning.  States she did not sleep well due to interruptions from from staff.   States she feels fine.  Denies fever, worsening shortness of breath, vomiting, chest pain, abdominal pain.  Flat affect.  Physical Exam: Vitals:   06/26/23 1546 06/26/23 1950 06/27/23 0426 06/27/23 0739  BP: (!) 139/92 (!) 154/79 (!) 142/56 (!) 154/72  Pulse: 80 75 73 72  Resp: 16 18 18 16   Temp: 98.3 F (36.8 C) (!) 97.3 F (36.3 C) 97.7 F (36.5 C) (!) 97.5 F (36.4 C)  TempSrc: Oral Oral Axillary Oral  SpO2: 99% 97% 96% 97%   Physical Exam GENERAL:  Alert, pleasant, malaise  HEENT:  EOMI CARDIOVASCULAR:  RRR, no murmurs appreciated RESPIRATORY:  Clear to auscultation, poor air movement GASTROINTESTINAL:  Soft, nontender, nondistended EXTREMITIES:  No LE edema bilaterally NEURO:  No new focal deficits appreciated SKIN:  No rashes noted PSYCH:  flat mood and affect   Data Reviewed:  There are no new results to review at this time.  Family Communication:   Disposition: Status is: Inpatient Remains inpatient appropriate because: Ongoing hypoxia from influenza  Planned Discharge Destination: Skilled nursing facility    Time spent: 32 minutes  Author: Carliss LELON Canales, DO 06/27/2023 11:22 AM  For on call review [email or web address].

## 2023-06-27 NOTE — Consult Note (Addendum)
 [facility] Consult Follow-up  Patient Name: .Annette Martin  MRN: [id number]  DOB: 07-26-1955  Consult Order details:  Orders (From admission, onward)     Start     Ordered   06/22/23 0002  IP CONSULT TO PSYCHIATRY       Ordering Provider: Sundil, Subrina, MD  Provider:  (Not yet assigned)  Question Answer Comment  Location [facility]   Reason for Consult? Extrapyramidal syndrome side effect of Haldol .  Need medication readjustment.      06/22/23 0001             Mode of Visit: In person    Psychiatry Consult Evaluation  Service Date: June 27, 2023 LOS:  LOS: 6 days  Chief Complaint: Altered Mental Status from Flu  Primary Psychiatric Diagnoses  Drug-induced parkinsonism, extra pyramidal symptoms 2.  Schizophrenia 3.  Intellectual Disability 4.  Behavioral problems  Assessment  Annette Martin is a 68 y.o. female admitted: Medically for 06/20/2023  7:02 PM for encephalopathy. She carries the psychiatric diagnoses of schizophrenia, intellectual disability, depression and has a past medical history of  thyroid disease, hypertension, T2DM, Rhabdomyolisis, Vitamin D deficient osteomalacia,.   Her current presentation of altered mental status, mixed rigidity, asymmetric tremor, pill rolling behavior and diaphoresis is most consistent with drug-induced parkinsonism in the setting of antipsychotic use She meets criteria for drug-induced parkinsonism based on history by patient, chart-reviewed history, physical presentation. Current outpatient psychotropic medications include haloperidol  5 mg TID, depakote  250 DR TID, gabapentin  300 mg TID, amantadine  100 mg BID, clonazepam  0.5 mg TID, and diphenhydramine  25 mg TID and historically she has had a modest response to these medications. She was compliant with medications prior to admission as evidenced by facility records. On initial examination, patient demonstrated good self control, but in subsequent  interviews has shown low frustration tolerance and emotional lability. Her life circumstances are challenging and it is regrettable that there is not more that we can offer her. On follow-up interview, patient actively hallucinating that there is a doll at the end of her bed. Will increase afternoon seroquel . Please see plan below for detailed recommendations. We will continue to follow.  06/27/2023: On reassessment today, patient found sitting up in bed and being fed breakfast by her nurse. She presents calm and pleasant. She is awake, alert and oriented x 2. Patient reports a restful night and does not appear to be in an acute crisis. She denies SI/HI and hallucinations in all modalities. She engaged in brief meaningful conversation. On examination, there is bilateral mixed rigidity of the upper extremities, with asymmetric tremors. Quetiapine  time changed from morning to 50 mg at 1400 daily, leaving only 25 mg in the morning, and 50 mg at bedtime to reflect the previous plan. We will continue to adjust current medications to avoid worsening of symptoms.    Diagnoses:  Active Hospital problems: Principal Problem:   Encephalopathy Active Problems:   Influenza A   Dehydration   Acute metabolic encephalopathy   Antipsychotic-induced neurological movement disorder    Plan   ## Psychiatric Medication Recommendations:  -- Continue clonazepam  0.5 mg TID PRN to BID PRN --Continue diphenhydramine  25 mg BID PRN -- Continue sertraline  200 mg at bedtime  -- Continue divalproex  250 mg TID  -- Continue gabapentin  300 mg TID -- Continue amantadine  100 mg BID -- Continue quetiapine  25 daily, quetiapine  50 mg at 1400 daily and 50 mg at bedtime -- Hold haldol  except for extreme agitation  Patient is on a high number of medications with possible psychotropic effects that can also cause delirium or her parkinsonism-like symptoms. She would benefit from a significant streamlining of her outpatient  medications. For now, the haldol  seems to be the most likely culprit for her extrapyramidal symptoms. Patient would be better served with a second generation antipsychotic. Could also consider reducing the zoloft  with the treatment of the quetiapine .  ## Medical Decision Making Capacity: Patient has a guardian and has thus been adjudicated incompetent; please involve patients guardian in medical decision making Patient's primary guardian is in jail.  ## Further Work-up:  -- Added on Creatinine Kinase to previous collection. Level came back elevated at 394. While pt on Qtc prolonging medications, please monitor & replete K+ to 4 and Mg2+ to 2 -- most recent EKG on 06/20/2023 had QtC of 443 ms -- Pertinent labwork reviewed earlier this admission includes: venous blood gases, CMP, CBC, respiratory panel, UA   ## Disposition:-- There are no psychiatric contraindications to discharge at this time  ## Behavioral / Environmental: -Delirium Precautions: Delirium Interventions for Nursing and Staff: - RN to open blinds every AM. - To Bedside: Glasses, hearing aide, and pt's own shoes. Make available to patients. when possible and encourage use. - Encourage po fluids when appropriate, keep fluids within reach. - OOB to chair with meals. - Passive ROM exercises to all extremities with AM & PM care. - RN to assess orientation to person, time and place QAM and PRN. - Recommend extended visitation hours with familiar family/friends as feasible. - Staff to minimize disturbances at night. Turn off television when pt asleep or when not in use.   ## Safety and Observation Level:  - Based on my clinical evaluation, I estimate the patient to be at low risk of self harm in the current setting. - At this time, we recommend  routine. This decision is based on my review of the chart including patient's history and current presentation, interview of the patient, mental status examination, and consideration of suicide risk  including evaluating suicidal ideation, plan, intent, suicidal or self-harm behaviors, risk factors, and protective factors. This judgment is based on our ability to directly address suicide risk, implement suicide prevention strategies, and develop a safety plan while the patient is in the clinical setting. Please contact our team if there is a concern that risk level has changed.  CSSR Risk Category:   Suicide Risk Assessment: Patient has following modifiable risk factors for suicide: none which we are addressing by continuing with planned management. Patient has following non-modifiable or demographic risk factors for suicide: psychiatric hospitalization Patient has the following protective factors against suicide: Access to outpatient mental health care  Thank you for this consult request. Recommendations have been communicated to the primary team.  We will follow at this time.   LAWERENCE GUT, NP        History of Present Illness  Relevant Aspects of Hospital Hospital Course:  Admitted on 06/20/2023 for encephalopathy in the setting of Influenza A. They are currently inpatient being treated for the flu, when team noticed increased rigidity, diaphoresis, and increased tremor.   Patient Report:  Patient found sitting up in bed being fed breakfast by her nurse. She presents calm and pleasant. She reports that she came to the hospital for the treatment of flu symptoms which she describes as better. Patient describes her mood as I'm doing good, nothing on my mind. She goes on to ask me about myself and  her questions were appropriate. No mentioning of doll. She denies SI/HI/AVH and does not appear to be responding to internal stimuli at this time.   Psych ROS:  Depression: denied symptoms of depression other than sleep changes and very recent poor mood due to illness Anxiety: Denies Mania (lifetime and current): Denies Psychosis: (lifetime and current): Did not understand  question.  Collateral information:  Pt has legal guardian, but he is currently in prison.   Will consult facility closer to discharge.   Review of Systems  Constitutional:  Positive for chills, diaphoresis, fever and malaise/fatigue. Negative for weight loss.  HENT:  Negative for hearing loss.   Respiratory:  Positive for cough, sputum production and shortness of breath.   Cardiovascular:  Negative for chest pain.  Gastrointestinal:  Negative for abdominal pain.  Neurological:  Positive for weakness. Negative for headaches.  Psychiatric/Behavioral:  Positive for depression. Negative for hallucinations, memory loss, substance abuse and suicidal ideas. The patient is nervous/anxious. The patient does not have insomnia.      Psychiatric and Social History  Psychiatric History:  Information collected from chart   Prev Dx/Sx: Schizophrenia, IDD. Current Psych Provider: [facility] Meds (current):  Current Outpatient Medications  Medication Instructions  . amantadine  (SYMMETREL ) 100 mg, Oral, 2 times daily  . atorvastatin  (LIPITOR) 10 mg, Oral, Daily at bedtime  . bethanechol  (URECHOLINE ) 25 mg, Oral, 3 times daily  . clonazePAM  (KLONOPIN ) 0.5 mg, Oral, 3 times daily  . divalproex  (DEPAKOTE ) 250 mg, Oral, 3 times daily  . docusate sodium  (COLACE) 200 mg, Oral, 2 times daily  . ferrous sulfate  325 mg, Oral, Daily with breakfast  . fesoterodine  (TOVIAZ ) 4 mg, Oral, Daily at bedtime  . gabapentin  (NEURONTIN ) 300 mg, Oral, 3 times daily  . haloperidol  (HALDOL ) 5 mg, Oral, 2 times daily  . lactulose  (CHRONULAC ) 20 g, Oral, Daily  . lidocaine  (LIDODERM ) 5 % 1 patch, Transdermal, Every 24 hours, Remove & Discard patch within 12 hours or as directed by MD  . losartan  (COZAAR ) 25 mg, Oral, Daily  . Magnesium  400 MG CAPS 1 tablet, Oral, 2 times daily  . meclizine (ANTIVERT) 25 mg, 3 times daily PRN  . meloxicam (MOBIC) 7.5 mg, Oral, Daily  . metFORMIN  (GLUCOPHAGE ) 1,000 mg, Oral, 2 times  daily with meals  . ondansetron  (ZOFRAN ) 4 mg, Every 6 hours PRN  . pantoprazole  (PROTONIX ) 40 mg, Oral, 2 times daily  . sertraline  (ZOLOFT ) 200 mg, Oral, Daily at bedtime  Previous Med Trials: ziprasidone , risperidone , hydroxyzine , quetiapine , temazepam , valbenazine  tosylate Therapy: none  Prior Psych Hospitalization: 2019  Prior Self Harm: Unable to assess. Prior Violence: Unable to assess.  Family Psych History: No history on file, Unable to assess from patient reports. Family Hx suicide: No history on file, Unable to assess from patient reports.  Social History:  Developmental Hx: Intellectually disabled. Educational Hx: No history on file, Unable to assess from patient reports. Occupational Hx: disabled Legal Hx: chart review does not provide legal history, patient not able to communicate. Living Situation: Patient lives in assisted living facility [facility]  [address]  [facility] [place] [address]  Spiritual Hx: no Access to weapons/lethal means: Denies (per cousin, Ivis Nicolson)   Substance History Alcohol: Denies  Type of alcohol Denies  Last Drink Denies  Number of drinks per day Denies  Tobacco: Denies  Illicit drugs: Denies  Prescription drug abuse: Denies  Rehab hx: Denies   Exam Findings  Physical Exam:  Vital Signs:  Temp:  [  97.3 F (36.3 C)-98.3 F (36.8 C)] 97.5 F (36.4 C) (02/09 0739) Pulse Rate:  [72-80] 72 (02/09 0739) Resp:  [16-18] 16 (02/09 0739) BP: (139-154)/(56-92) 154/72 (02/09 0739) SpO2:  [96 %-99 %] 97 % (02/09 0739) Blood pressure (!) 154/72, pulse 72, temperature (!) 97.5 F (36.4 C), temperature source Oral, resp. rate 16, SpO2 97%. There is no height or weight on file to calculate BMI.  Physical Exam Vitals and nursing note reviewed.  Constitutional:      Appearance: She is ill-appearing and diaphoretic.  HENT:     Head: Normocephalic and atraumatic.     Nose: Congestion present.  Skin:    General: Skin is warm.   Neurological:     Mental Status: She is alert. Mental status is at baseline.  Psychiatric:        Attention and Perception: Attention normal. She perceives visual hallucinations.        Speech: Speech normal.        Behavior: Behavior is cooperative.        Thought Content: Thought content is not paranoid or delusional. Thought content does not include homicidal or suicidal ideation.        Cognition and Memory: Memory normal. Cognition is impaired.    Mental Status Exam: General Appearance: Casual  Orientation:  Other:  Name, place, year  Memory:  Immediate;   Good Remote;   Fair  Concentration:  Concentration: Good  Recall:  NA  Attention  Good  Eye Contact:  Good  Speech:  Normal Rate  Language:  Fair - speech impediment  Volume:  Normal  Mood: I'm doing good, nothing on my mind.   Affect:  Appropriate  Thought Process:  Coherent and Linear  Thought Content:  WDL  Suicidal Thoughts:  No  Homicidal Thoughts:  No  Judgement:  Intact  Insight:  Shallow  Psychomotor Activity:  EPS, Increased, Mannerisms, Restlessness, and Tremor - Minimal left-sided tremor. The right sided tremor is still present, but is more consistent with an intention tremor. Tongue intermittently sticking out  Akathisia:  Yes  Fund of Knowledge:  Fair      Assets:  Desire for Improvement Leisure Time Vocational/Educational  Cognition:  Impaired,  Mild  ADL's:  Impaired  AIMS (if indicated):        Other History   These have been pulled in through the EMR, reviewed, and updated if appropriate.  Family History:  The patient's family history is not on file.  Medical History: Past Medical History:  Diagnosis Date  . Depression   . Diabetes mellitus    type 2  . GERD (gastroesophageal reflux disease)   . Hypertension   . Hypoxemia   . Mental retardation   . Neuroleptic malignant syndrome   . Rhabdomyolysis   . Schizophrenia (HCC)   . Thyroid disease   . Vitamin D deficient osteomalacia      Surgical History: Past Surgical History:  Procedure Laterality Date  . COLONOSCOPY  2010   MANN     Medications:   Current Facility-Administered Medications:  .  acetaminophen  (TYLENOL ) tablet 1,000 mg, 1,000 mg, Oral, Q6H PRN, Segars, Jonathan, MD, 1,000 mg at 06/22/23 2139 .  albuterol  (PROVENTIL ) (2.5 MG/3ML) 0.083% nebulizer solution 2.5 mg, 2.5 mg, Nebulization, Q4H PRN, Sundil, Subrina, MD, 2.5 mg at 06/22/23 2135 .  amantadine  (SYMMETREL ) capsule 100 mg, 100 mg, Oral, BID, Segars, Dorn, MD, 100 mg at 06/27/23 0938 .  atorvastatin  (LIPITOR) tablet 10 mg, 10 mg, Oral,  QHS, Segars, Jonathan, MD, 10 mg at 06/26/23 2109 .  bethanechol  (URECHOLINE ) tablet 25 mg, 25 mg, Oral, TID, Segars, Dorn, MD, 25 mg at 06/27/23 9060 .  clonazePAM  (KLONOPIN ) tablet 0.5 mg, 0.5 mg, Oral, BID PRN, Arlon Carliss ORN, DO, 0.5 mg at 06/26/23 1418 .  diphenhydrAMINE  (BENADRYL ) capsule 25 mg, 25 mg, Oral, BID PRN, Arlon Carliss ORN, DO, 25 mg at 06/26/23 2112 .  divalproex  (DEPAKOTE ) DR tablet 250 mg, 250 mg, Oral, TID, Segars, Jonathan, MD, 250 mg at 06/27/23 9061 .  docusate sodium  (COLACE) capsule 100 mg, 100 mg, Oral, BID, Segars, Jonathan, MD, 100 mg at 06/27/23 9060 .  ferrous sulfate  tablet 325 mg, 325 mg, Oral, Q breakfast, Segars, Dorn, MD, 325 mg at 06/27/23 9060 .  fesoterodine  (TOVIAZ ) tablet 4 mg, 4 mg, Oral, QHS, Segars, Dorn, MD, 4 mg at 06/26/23 2110 .  gabapentin  (NEURONTIN ) capsule 300 mg, 300 mg, Oral, TID, Maree, Pratik D, DO, 300 mg at 06/27/23 9060 .  insulin  aspart (novoLOG ) injection 0-5 Units, 0-5 Units, Subcutaneous, QHS, Sundil, Subrina, MD .  insulin  aspart (novoLOG ) injection 0-6 Units, 0-6 Units, Subcutaneous, TID WC, Sundil, Subrina, MD, 1 Units at 06/24/23 1307 .  lactulose  (CHRONULAC ) 10 GM/15ML solution 10 g, 10 g, Oral, Daily PRN, Segars, Jonathan, MD .  LORazepam  (ATIVAN ) injection 2 mg, 2 mg, Intravenous, Q4H PRN, Arlon Carliss ORN, DO, 2 mg at 06/26/23  2110 .  melatonin tablet 6 mg, 6 mg, Oral, QHS PRN, Segars, Jonathan, MD, 6 mg at 06/26/23 2110 .  ondansetron  (ZOFRAN ) injection 4 mg, 4 mg, Intravenous, Q6H PRN, Segars, Jonathan, MD .  pantoprazole  (PROTONIX ) EC tablet 40 mg, 40 mg, Oral, BID, Segars, Dorn, MD, 40 mg at 06/27/23 9060 .  QUEtiapine  (SEROQUEL  XR) 24 hr tablet 50 mg, 50 mg, Oral, QHS, 50 mg at 06/26/23 2110 **AND** QUEtiapine  (SEROQUEL ) tablet 25 mg, 25 mg, Oral, Daily, Chien, Stephanie, MD, 25 mg at 06/27/23 9060 .  QUEtiapine  (SEROQUEL ) tablet 50 mg, 50 mg, Oral, Daily, Chien, Stephanie, MD, 50 mg at 06/27/23 9060 .  sertraline  (ZOLOFT ) tablet 200 mg, 200 mg, Oral, QHS, Segars, Jonathan, MD, 200 mg at 06/26/23 2110  Allergies: No Known Allergies  Alverto Shedd, NP

## 2023-06-28 DIAGNOSIS — F79 Unspecified intellectual disabilities: Secondary | ICD-10-CM | POA: Diagnosis not present

## 2023-06-28 DIAGNOSIS — T43505A Adverse effect of unspecified antipsychotics and neuroleptics, initial encounter: Secondary | ICD-10-CM | POA: Diagnosis not present

## 2023-06-28 DIAGNOSIS — G2119 Other drug induced secondary parkinsonism: Secondary | ICD-10-CM | POA: Diagnosis not present

## 2023-06-28 DIAGNOSIS — F2089 Other schizophrenia: Secondary | ICD-10-CM | POA: Diagnosis not present

## 2023-06-28 LAB — CBC
HCT: 35.9 % — ABNORMAL LOW (ref 36.0–46.0)
Hemoglobin: 11.6 g/dL — ABNORMAL LOW (ref 12.0–15.0)
MCH: 28.5 pg (ref 26.0–34.0)
MCHC: 32.3 g/dL (ref 30.0–36.0)
MCV: 88.2 fL (ref 80.0–100.0)
Platelets: 212 10*3/uL (ref 150–400)
RBC: 4.07 MIL/uL (ref 3.87–5.11)
RDW: 14 % (ref 11.5–15.5)
WBC: 5.5 10*3/uL (ref 4.0–10.5)
nRBC: 0 % (ref 0.0–0.2)

## 2023-06-28 LAB — BASIC METABOLIC PANEL
Anion gap: 17 — ABNORMAL HIGH (ref 5–15)
BUN: 13 mg/dL (ref 8–23)
CO2: 28 mmol/L (ref 22–32)
Calcium: 9.7 mg/dL (ref 8.9–10.3)
Chloride: 96 mmol/L — ABNORMAL LOW (ref 98–111)
Creatinine, Ser: 0.95 mg/dL (ref 0.44–1.00)
GFR, Estimated: >60 mL/min (ref >60)
Glucose, Bld: 118 mg/dL — ABNORMAL HIGH (ref 70–99)
Potassium: 3.9 mmol/L (ref 3.5–5.1)
Sodium: 141 mmol/L (ref 135–145)

## 2023-06-28 LAB — MAGNESIUM: Magnesium: 1.9 mg/dL (ref 1.7–2.4)

## 2023-06-28 LAB — GLUCOSE, CAPILLARY
Glucose-Capillary: 141 mg/dL — ABNORMAL HIGH (ref 70–99)
Glucose-Capillary: 308 mg/dL — ABNORMAL HIGH (ref 70–99)

## 2023-06-28 MED ORDER — CLONAZEPAM 0.5 MG PO TABS: 0.5000 mg | ORAL_TABLET | Freq: Two times a day (BID) | ORAL | 0 refills | Status: DC | PRN

## 2023-06-28 MED ORDER — QUETIAPINE FUMARATE ER 50 MG PO TB24
50.0000 mg | ORAL_TABLET | Freq: Every day | ORAL | Status: DC
Start: 2023-06-28 — End: 2024-01-08

## 2023-06-28 MED ORDER — DOCUSATE SODIUM 100 MG PO CAPS: 100.0000 mg | ORAL_CAPSULE | Freq: Two times a day (BID) | ORAL | Status: DC

## 2023-06-28 MED ORDER — QUETIAPINE FUMARATE 50 MG PO TABS: 50.0000 mg | ORAL_TABLET | Freq: Every day | ORAL | Status: DC

## 2023-06-28 MED ORDER — QUETIAPINE FUMARATE 25 MG PO TABS: 25.0000 mg | ORAL_TABLET | Freq: Every day | ORAL | Status: DC

## 2023-06-28 MED ORDER — LACTULOSE 10 GM/15ML PO SOLN: 10.0000 g | Freq: Every day | ORAL | Status: DC | PRN

## 2023-06-28 NOTE — TOC Transition Note (Addendum)
 Transition of Care [facility]) - Discharge Note   Patient Details  Name: Annette Martin MRN: [id number] Date of Birth: Dec 06, 1955  [facility] CM/SW Contact:  Juliane Och, LCSW Phone Number: 06/28/2023, 12:03 PM   Clinical Narrative:     Patient will DC to: [facility] Anticipated DC date: 06/19/2023 Family notified: Judith Wolverton; Arbie Knock; [phone] Transport by: Lyna Sandhoff   Per MD patient ready for DC to [facility]. RN to call report prior to discharge [phone]). RN, patient's family, and facility notified of DC (patient disoriented x2). Discharge Summary and FL2 sent to facility. DC packet on chart. Ambulance transport requested for patient. Insurance authorization was approved upon peer-to-peer offer.  CSW will sign off for now as social work intervention is no longer needed. Please consult us  again if new needs arise.    Final next level of care: Skilled Nursing Facility Barriers to Discharge: Barriers Resolved   Patient Goals and CMS Choice     Choice offered to / list presented to : [facility] POA / Guardian (and cousin) [place] ownership interest in [name]provided to:: [facility] POA / Guardian (and cousin)    Discharge Placement              Patient chooses bed at: [facility] Patient to be transferred to facility by: [facility] Name of family member notified: Zeinab Grandinetti; Arbie Knock; [phone] Patient and family notified of of transfer: 06/28/23  Discharge Plan and Services Additional resources added to the After Visit Summary for                                       Social Drivers of Health (SDOH) Interventions SDOH Screenings   Food Insecurity: Patient Unable To Answer (06/23/2023)  Housing: Patient Unable To Answer (06/23/2023)  Transportation Needs: Patient Unable To Answer (06/23/2023)  Utilities: Patient Unable To Answer (06/23/2023)  Alcohol Screen: Low Risk  (04/05/2018)  Social Connections:  Patient Unable To Answer (06/23/2023)  Tobacco Use: Low Risk  (06/12/2023)     Readmission Risk Interventions     No data to display

## 2023-06-28 NOTE — Progress Notes (Signed)
 Report called to [facility] LPN

## 2023-06-28 NOTE — Consult Note (Signed)
[facility] Consult Follow-up  Patient Name: .DAJA SHUPING  MRN: [id number]  DOB: 1955-07-09  Consult Order details:  Orders (From admission, onward)     Start     Ordered   06/22/23 0002  IP CONSULT TO PSYCHIATRY       Ordering Provider: Tereasa Coop, MD  Provider:  (Not yet assigned)  Question Answer Comment  Location [facility]   Reason for Consult? Extrapyramidal syndrome side effect of Haldol.  Need medication readjustment.      06/22/23 0001             Mode of Visit: In person    Psychiatry Consult Evaluation  Service Date: June 28, 2023 LOS:  LOS: 7 days  Chief Complaint: Altered Mental Status from Flu  Primary Psychiatric Diagnoses  Drug-induced parkinsonism, extra pyramidal symptoms 2.  Schizophrenia 3.  Intellectual Disability 4.  Behavioral problems  Assessment  Annette Martin is a 68 y.o. female admitted: Medically for 06/20/2023  7:02 PM for encephalopathy. She carries the psychiatric diagnoses of schizophrenia, intellectual disability, depression and has a past medical history of  thyroid disease, hypertension, T2DM, Rhabdomyolisis, Vitamin D deficient osteomalacia,.   Her current presentation of altered mental status, mixed rigidity, asymmetric tremor, pill rolling behavior and diaphoresis is most consistent with drug-induced parkinsonism in the setting of antipsychotic use She meets criteria for drug-induced parkinsonism based on history by patient, chart-reviewed history, physical presentation. Current outpatient psychotropic medications include haloperidol 5 mg TID, depakote 250 DR TID, gabapentin 300 mg TID, amantadine 100 mg BID, clonazepam 0.5 mg TID, and diphenhydramine 25 mg TID and historically she has had a modest response to these medications. She was compliant with medications prior to admission as evidenced by facility records. On initial examination, patient demonstrated good self control, but in subsequent  interviews has shown low frustration tolerance and emotional lability. Her life circumstances are challenging and it is regrettable that there is not more that we can offer her. On follow-up interview, patient actively hallucinating that there is a doll at the end of her bed. Will increase afternoon seroquel. Please see plan below for detailed recommendations. We will continue to follow.   Diagnoses:  Active Hospital problems: Principal Problem:   Encephalopathy Active Problems:   Influenza A   Dehydration   Acute metabolic encephalopathy   Antipsychotic-induced neurological movement disorder    Plan   ## Psychiatric Medication Recommendations:  -- Continue clonazepam 0.5 mg TID PRN to BID PRN --Continue diphenhydramine 25 mg BID PRN -- Continue sertraline 200 mg at bedtime  -- Continue divalproex 250 mg TID  -- Continue gabapentin 300 mg TID -- Continue amantadine 100 mg BID -- Hold haldol except for extreme agitation   Patient is on a high number of medications with possible psychotropic effects that can also cause delirium or her parkinsonism-like symptoms. She would benefit from a significant streamlining of her outpatient medications. For now, the haldol seems to be the most likely culprit for her extrapyramidal symptoms. Patient would be better served with a second generation antipsychotic. Could also consider reducing the zoloft with the treatment of the quetiapine.  -- Continue quetiapine 50 mg at bedtime for schizophrenia,  -- Continue quetiapine 25 mg 0800, increase quetiapine 50mg  1400 for schizophrenia  ## Medical Decision Making Capacity: Patient has a guardian and has thus been adjudicated incompetent; please involve patients guardian in medical decision making Patient's primary guardian is in jail.  ## Further Work-up:  -- Added  on Creatinine Kinase to previous collection. Level came back elevated at 394. While pt on Qtc prolonging medications, please monitor &  replete K+ to 4 and Mg2+ to 2 -- most recent EKG on 06/20/2023 had QtC of 443 ms -- Pertinent labwork reviewed earlier this admission includes: venous blood gases, CMP, CBC, respiratory panel, UA   ## Disposition:-- There are no psychiatric contraindications to discharge at this time  ## Behavioral / Environmental: -Delirium Precautions: Delirium Interventions for Nursing and Staff: - RN to open blinds every AM. - To Bedside: Glasses, hearing aide, and pt's own shoes. Make available to patients. when possible and encourage use. - Encourage po fluids when appropriate, keep fluids within reach. - OOB to chair with meals. - Passive ROM exercises to all extremities with AM & PM care. - RN to assess orientation to person, time and place QAM and PRN. - Recommend extended visitation hours with familiar family/friends as feasible. - Staff to minimize disturbances at night. Turn off television when pt asleep or when not in use.   ## Safety and Observation Level:  - Based on my clinical evaluation, I estimate the patient to be at low risk of self harm in the current setting. - At this time, we recommend  routine. This decision is based on my review of the chart including patient's history and current presentation, interview of the patient, mental status examination, and consideration of suicide risk including evaluating suicidal ideation, plan, intent, suicidal or self-harm behaviors, risk factors, and protective factors. This judgment is based on our ability to directly address suicide risk, implement suicide prevention strategies, and develop a safety plan while the patient is in the clinical setting. Please contact our team if there is a concern that risk level has changed.  CSSR Risk Category:   Suicide Risk Assessment: Patient has following modifiable risk factors for suicide: none which we are addressing by continuing with planned management. Patient has following non-modifiable or demographic risk  factors for suicide: psychiatric hospitalization Patient has the following protective factors against suicide: Access to outpatient mental health care  Thank you for this consult request. Recommendations have been communicated to the primary team.  We will sign off at this time.   Margaretmary Dys, MD        History of Present Illness  Relevant Aspects of [facility] Course:  Admitted on 06/20/2023 for encephalopathy in the setting of Influenza A. They are currently inpatient being treated for the flu, when team noticed increased rigidity, diaphoresis, and increased tremor.   Patient Report:  Patient was seen upright in chair. She voiced that she was feeling better and she was ready to go. She voiced no SI/HI/AVH or major problems during our interview this morning. She was primarily interested in following her TV show and requested that the notewriter bring her a drink on his way out.  Psych ROS:  Depression: denied symptoms of depression other than sleep changes and very recent poor mood due to illness Anxiety: Denies Mania (lifetime and current): Denies Psychosis: (lifetime and current): Did not understand question.  Collateral information:  Pt has legal guardian, but he is currently in prison.   Will consult facility closer to discharge.   Review of Systems  Constitutional:  Positive for malaise/fatigue. Negative for weight loss.  HENT:  Negative for hearing loss.   Respiratory:  Positive for cough and shortness of breath.   Cardiovascular:  Negative for chest pain.  Gastrointestinal:  Negative for abdominal pain.  Neurological:  Positive for weakness. Negative for headaches.  Psychiatric/Behavioral:  Positive for depression. Negative for hallucinations, memory loss, substance abuse and suicidal ideas. The patient is nervous/anxious. The patient does not have insomnia.      Psychiatric and Social History  Psychiatric History:  Information collected from chart    Prev Dx/Sx: Schizophrenia, IDD. Current Psych Provider: [facility] Meds (current):  Current Outpatient Medications  Medication Instructions   amantadine (SYMMETREL) 100 mg, Oral, 2 times daily   atorvastatin (LIPITOR) 10 mg, Oral, Daily at bedtime   bethanechol (URECHOLINE) 25 mg, Oral, 3 times daily   clonazePAM (KLONOPIN) 0.5 mg, Oral, 3 times daily   divalproex (DEPAKOTE) 250 mg, Oral, 3 times daily   docusate sodium (COLACE) 200 mg, Oral, 2 times daily   ferrous sulfate 325 mg, Oral, Daily with breakfast   fesoterodine (TOVIAZ) 4 mg, Oral, Daily at bedtime   gabapentin (NEURONTIN) 300 mg, Oral, 3 times daily   haloperidol (HALDOL) 5 mg, Oral, 2 times daily   lactulose (CHRONULAC) 20 g, Oral, Daily   lidocaine (LIDODERM) 5 % 1 patch, Transdermal, Every 24 hours, Remove & Discard patch within 12 hours or as directed by MD   losartan (COZAAR) 25 mg, Oral, Daily   Magnesium 400 MG CAPS 1 tablet, Oral, 2 times daily   meclizine (ANTIVERT) 25 mg, 3 times daily PRN   meloxicam (MOBIC) 7.5 mg, Oral, Daily   metFORMIN (GLUCOPHAGE) 1,000 mg, Oral, 2 times daily with meals   ondansetron (ZOFRAN) 4 mg, Every 6 hours PRN   pantoprazole (PROTONIX) 40 mg, Oral, 2 times daily   sertraline (ZOLOFT) 200 mg, Oral, Daily at bedtime  Previous Med Trials: ziprasidone, risperidone, hydroxyzine, quetiapine, temazepam, valbenazine tosylate Therapy: none  Prior Psych Hospitalization: 2019  Prior Self Harm: Unable to assess. Prior Violence: Unable to assess.  Family Psych History: No history on file, Unable to assess from patient reports. Family Hx suicide: No history on file, Unable to assess from patient reports.  Social History:  Developmental Hx: Intellectually disabled. Educational Hx: No history on file, Unable to assess from patient reports. Occupational Hx: disabled Legal Hx: chart review does not provide legal history, patient not able to communicate. Living Situation: Patient lives  in assisted living facility [facility]  [address]  [facility] [place] [address]  Spiritual Hx: no Access to weapons/lethal means: Denies (per cousin, Kamariyah Timberlake)   Substance History Alcohol: Denies  Type of alcohol Denies  Last Drink Denies  Number of drinks per day Denies  Tobacco: Denies  Illicit drugs: Denies  Prescription drug abuse: Denies  Rehab hx: Denies   Exam Findings  Physical Exam:  Vital Signs:  Temp:  [97.4 F (36.3 C)-97.8 F (36.6 C)] 97.4 F (36.3 C) (02/10 0754) Pulse Rate:  [69-82] 73 (02/10 0754) Resp:  [16-18] 16 (02/10 0754) BP: (105-161)/(48-98) 161/78 (02/10 0754) SpO2:  [93 %-100 %] 100 % (02/10 0754) Blood pressure (!) 161/78, pulse 73, temperature (!) 97.4 F (36.3 C), temperature source Oral, resp. rate 16, SpO2 100%. There is no height or weight on file to calculate BMI.  Physical Exam Vitals and nursing note reviewed.  Constitutional:      Appearance: She is ill-appearing and diaphoretic.  HENT:     Head: Normocephalic and atraumatic.     Nose: Congestion present.  Skin:    General: Skin is warm.  Neurological:     Mental Status: She is alert. Mental status is at baseline.  Psychiatric:  Attention and Perception: Attention normal. She perceives visual hallucinations.        Speech: Speech normal.        Behavior: Behavior is cooperative.        Thought Content: Thought content is not paranoid or delusional. Thought content does not include homicidal or suicidal ideation.        Cognition and Memory: Memory normal. Cognition is impaired.    Mental Status Exam: General Appearance: Casual  Orientation:  Other:  Name, place, year  Memory:  Immediate;   Good Remote;   Fair  Concentration:  Concentration: Good  Recall:  NA  Attention  Good  Eye Contact:  Good  Speech:  Normal Rate  Language:  Fair - speech impediment  Volume:  Normal  Mood: "Get that doll out of my bed"  Affect:  Appropriate  Thought Process:   Coherent and Linear  Thought Content:  WDL  Suicidal Thoughts:  No  Homicidal Thoughts:  No  Judgement:  Intact  Insight:  Shallow  Psychomotor Activity:  EPS, Increased, Mannerisms, Restlessness, and Tremor - Minimal left-sided tremor. The right sided tremor is still present, but is more consistent with an intention tremor. Tongue intermittently sticking out  Akathisia:  Yes  Fund of Knowledge:  Fair      Assets:  Desire for Improvement Leisure Time Vocational/Educational  Cognition:  Impaired,  Mild  ADL's:  Impaired  AIMS (if indicated):        Other History   These have been pulled in through the EMR, reviewed, and updated if appropriate.  Family History:  The patient's family history is not on file.  Medical History: Past Medical History:  Diagnosis Date   Depression    Diabetes mellitus    type 2   GERD (gastroesophageal reflux disease)    Hypertension    Hypoxemia    Mental retardation    Neuroleptic malignant syndrome    Rhabdomyolysis    Schizophrenia (HCC)    Thyroid disease    Vitamin D deficient osteomalacia     Surgical History: Past Surgical History:  Procedure Laterality Date   COLONOSCOPY  2010   MANN     Medications:   Current Facility-Administered Medications:    acetaminophen (TYLENOL) tablet 1,000 mg, 1,000 mg, Oral, Q6H PRN, Dolly Rias, MD, 1,000 mg at 06/22/23 2139   albuterol (PROVENTIL) (2.5 MG/3ML) 0.083% nebulizer solution 2.5 mg, 2.5 mg, Nebulization, Q4H PRN, Sundil, Subrina, MD, 2.5 mg at 06/22/23 2135   amantadine (SYMMETREL) capsule 100 mg, 100 mg, Oral, BID, Segars, Christiane Ha, MD, 100 mg at 06/28/23 1610   atorvastatin (LIPITOR) tablet 10 mg, 10 mg, Oral, QHS, Segars, Christiane Ha, MD, 10 mg at 06/27/23 2100   bethanechol (URECHOLINE) tablet 25 mg, 25 mg, Oral, TID, Segars, Christiane Ha, MD, 25 mg at 06/28/23 9604   clonazePAM (KLONOPIN) tablet 0.5 mg, 0.5 mg, Oral, BID PRN, Toni Amend W, DO, 0.5 mg at 06/28/23 0840    diphenhydrAMINE (BENADRYL) capsule 25 mg, 25 mg, Oral, BID PRN, Toni Amend W, DO, 25 mg at 06/26/23 2112   divalproex (DEPAKOTE) DR tablet 250 mg, 250 mg, Oral, TID, Segars, Christiane Ha, MD, 250 mg at 06/28/23 0840   docusate sodium (COLACE) capsule 100 mg, 100 mg, Oral, BID, Segars, Christiane Ha, MD, 100 mg at 06/28/23 5409   ferrous sulfate tablet 325 mg, 325 mg, Oral, Q breakfast, Segars, Christiane Ha, MD, 325 mg at 06/28/23 0840   fesoterodine (TOVIAZ) tablet 4 mg, 4 mg, Oral, QHS, Dolly Rias, MD,  4 mg at 06/27/23 2101   gabapentin (NEURONTIN) capsule 300 mg, 300 mg, Oral, TID, Sherryll Burger, Pratik D, DO, 300 mg at 06/28/23 4540   insulin aspart (novoLOG) injection 0-5 Units, 0-5 Units, Subcutaneous, QHS, Sundil, Subrina, MD, 2 Units at 06/27/23 2105   insulin aspart (novoLOG) injection 0-6 Units, 0-6 Units, Subcutaneous, TID WC, Sundil, Subrina, MD, 1 Units at 06/27/23 1325   lactulose (CHRONULAC) 10 GM/15ML solution 10 g, 10 g, Oral, Daily PRN, Dolly Rias, MD   LORazepam (ATIVAN) injection 2 mg, 2 mg, Intravenous, Q4H PRN, Sharlene Dory, Derek W, DO, 2 mg at 06/26/23 2110   melatonin tablet 6 mg, 6 mg, Oral, QHS PRN, Dolly Rias, MD, 6 mg at 06/27/23 2100   ondansetron (ZOFRAN) injection 4 mg, 4 mg, Intravenous, Q6H PRN, Dolly Rias, MD   pantoprazole (PROTONIX) EC tablet 40 mg, 40 mg, Oral, BID, Segars, Christiane Ha, MD, 40 mg at 06/28/23 0840   QUEtiapine (SEROQUEL XR) 24 hr tablet 50 mg, 50 mg, Oral, QHS, 50 mg at 06/27/23 2101 **AND** QUEtiapine (SEROQUEL) tablet 25 mg, 25 mg, Oral, Daily, Karie Fetch, MD, 25 mg at 06/28/23 9811   QUEtiapine (SEROQUEL) tablet 50 mg, 50 mg, Oral, Q1400, Bolaji, Adepeju, NP   sertraline (ZOLOFT) tablet 200 mg, 200 mg, Oral, QHS, Dolly Rias, MD, 200 mg at 06/27/23 2100  Allergies: No Known Allergies  Margaretmary Dys, MD

## 2023-06-28 NOTE — Discharge Summary (Signed)
 Physician Discharge Summary   Patient: Annette Martin MRN: [id number] DOB: 1955/11/13  Admit date:     06/20/2023  Discharge date: 06/28/23  Discharge Physician: Jodeane Mulligan   PCP: Deedra Farr, MD   Recommendations at discharge:   At this time patient will be discharged to SNF.  If you experience any symptoms such as fever, vomiting, shortness of breath, chest pain, abdominal pain, or other concerning symptoms, please call your primary care provider or go to the emergency department immediately.  Discharge Diagnoses: Principal Problem:   Encephalopathy Active Problems:   Influenza A   Dehydration   Acute metabolic encephalopathy   Antipsychotic-induced neurological movement disorder  Resolved Problems:   * No resolved hospital problems. *  Hospital Course:   Annette Martin is a 68 y.o. female with hx of schizophrenia, diabetes, hyperlipidemia, hypothyroidism, urinary incontinence, who was brought in from [facility] for altered mental status.  She was admitted for acute metabolic encephalopathy in the setting of influenza A infection as well as some dehydration.  She is noted to have some bradykinesia with cogwheel rigidity and resting tremor suspicious for EPS.  EEG performed with results pending.  Psychiatry consulted for assistance in management of medications.  PT recommending SNF.   Assessment and Plan:  Encephalopathy acute, metabolic with bradykinesia and resting tremor -Suspect her encephalopathy is multifactorial related to her influenza A infection, dehydration, medication effects.  Appreciate psychiatry for adjustment of antipsychotic medications.  Showed improvement throughout the course of hospital stay.  Appears to be resolved.  Acute Influenza A  - No longer requiring nasal cannula, breathing comfortably on room air.  Completed 5 days of Tamiflu .  Received albuterol  prn, incentive spirometer.  Supportive care.  Appears to be resolved.    Dehydration -Appears to be resolving after IV fluid hydration.  Encourage PO intake.    Deconditioning -PT evaluation recommending SNF   History of schizophrenia/intellectual disability/depression -Appreciate evaluation by psychiatry.  Appreciate their input on medication regimen.  Per psych, increased quetiapine  3 times daily from 25/25/50 to 25/50/50.   Diabetes: Home regimen is metformin .   Neuropathy: Gabapentin  resumed HLD: Continue home atorvastatin  Hypothyroidism: TSH 3.4 Urinary incontinence: Continue home bethanechol  3 times daily and fesoteridine nightly GERD: home PPI    Goals of care - Working closely with case management on disposition planning.  Patient has been accepted at [facility], discharged today.      Pain control - [place]  Controlled Substance Reporting System database was reviewed. and patient was instructed, not to drive, operate heavy machinery, perform activities at heights, swimming or participation in water activities or provide baby-sitting services while on Pain, Sleep and Anxiety Medications; until their outpatient Physician has advised to do so again. Also recommended to not to take more than prescribed Pain, Sleep and Anxiety Medications.  Consultants: Psychiatry Procedures performed: None Disposition: Skilled nursing facility Diet recommendation:  Discharge Diet Orders (From admission, onward)     Start     Ordered   06/28/23 0000  Diet - low sodium heart healthy        06/28/23 1118           Cardiac and Carb modified diet DISCHARGE MEDICATION: Allergies as of 06/28/2023   No Known Allergies      Medication List     STOP taking these medications    haloperidol  5 MG tablet Commonly known as: HALDOL        TAKE these medications    amantadine  100 MG  capsule Commonly known as: SYMMETREL  Take 100 mg by mouth 2 (two) times daily.   atorvastatin  10 MG tablet Commonly known as: LIPITOR Take 10 mg by mouth at bedtime.    bethanechol  25 MG tablet Commonly known as: URECHOLINE  Take 25 mg by mouth 3 (three) times daily.   clonazePAM  0.5 MG tablet Commonly known as: KLONOPIN  Take 1 tablet (0.5 mg total) by mouth 2 (two) times daily as needed (anxiety). What changed:  when to take this reasons to take this   divalproex  250 MG DR tablet Commonly known as: DEPAKOTE  Take 250 mg by mouth 3 (three) times daily.   docusate sodium  100 MG capsule Commonly known as: COLACE Take 1 capsule (100 mg total) by mouth 2 (two) times daily. What changed: how much to take   ferrous sulfate  325 (65 FE) MG tablet Take 1 tablet (325 mg total) by mouth daily with breakfast.   fesoterodine  4 MG Tb24 tablet Commonly known as: TOVIAZ  Take 4 mg by mouth at bedtime.   gabapentin  300 MG capsule Commonly known as: NEURONTIN  Take 300 mg by mouth 3 (three) times daily.   lactulose  10 GM/15ML solution Commonly known as: CHRONULAC  Take 15 mLs (10 g total) by mouth daily as needed for mild constipation. What changed:  how much to take when to take this reasons to take this   lidocaine  5 % Commonly known as: LIDODERM  Place 1 patch onto the skin daily. Remove & Discard patch within 12 hours or as directed by MD   losartan  25 MG tablet Commonly known as: COZAAR  Take 1 tablet (25 mg total) by mouth daily.   Magnesium  400 MG Caps Take 1 tablet by mouth 2 (two) times daily.   meclizine 25 MG tablet Commonly known as: ANTIVERT Take 25 mg by mouth 3 (three) times daily as needed for dizziness.   meloxicam 7.5 MG tablet Commonly known as: MOBIC Take 7.5 mg by mouth daily.   metFORMIN  1000 MG tablet Commonly known as: GLUCOPHAGE  Take 1,000 mg by mouth 2 (two) times daily with a meal.   ondansetron  4 MG tablet Commonly known as: ZOFRAN  Take 4 mg by mouth every 6 (six) hours as needed for nausea or vomiting.   pantoprazole  40 MG tablet Commonly known as: PROTONIX  Take 40 mg by mouth 2 (two) times daily.    QUEtiapine  50 MG Tb24 24 hr tablet Commonly known as: SEROQUEL  XR Take 1 tablet (50 mg total) by mouth at bedtime.   QUEtiapine  50 MG tablet Commonly known as: SEROQUEL  Take 1 tablet (50 mg total) by mouth daily at 2 PM.   QUEtiapine  25 MG tablet Commonly known as: SEROQUEL  Take 1 tablet (25 mg total) by mouth daily. Start taking on: June 29, 2023   sertraline  100 MG tablet Commonly known as: ZOLOFT  Take 200 mg by mouth at bedtime.        Discharge Exam: There were no vitals filed for this visit. GENERAL:  Alert, pleasant, no acute distress  HEENT:  EOMI CARDIOVASCULAR:  RRR, no murmurs appreciated RESPIRATORY:  Clear to auscultation, no wheezing, rales, or rhonchi GASTROINTESTINAL:  Soft, nontender, nondistended EXTREMITIES:  No LE edema bilaterally NEURO:  No new focal deficits appreciated SKIN:  No rashes noted PSYCH:  Appropriate mood, flat affect     Condition at discharge: improving  The results of significant diagnostics from this hospitalization (including imaging, microbiology, ancillary and laboratory) are listed below for reference.   Imaging Studies: DG Swallowing Func-Speech Pathology Result Date: 06/23/2023  Table formatting from the original result was not included. Modified Barium Swallow Study Patient Details Name: STEPHENI OTTENBACHER MRN: [id number] Date of Birth: 25-Dec-1955 Today's Date: 06/23/2023 HPI/PMH: HPI: Patient is a 68 y.o. female with PMH: schizophrenia, DM, HLD, hypothyroidism, urinary incontinence who was brought to hospital from [facility] for AMS on 06/20/23 In ED, she was somnolent, CXR did not show evidence of active pulmonary disease, CT head and MRI brain both negatie for acute intracranial abnormality. SLP swallow evaluation ordered secondary to patient with excessive coughing with PO intake. Clinical Impression: Clinical Impression: Patient presents with a primary oral phase dysphagia with secondary pharyngoesophageal phase dysphagia. No  aspiration observed with any of the tested barium consistencies during any phase of the swallow.  During oral phase, she exhibited decreased efficiency of mastication with solids and decreased anterior to posterior transit of puree and mechanical soft solids as well as honey thick liquids. Following initial swallows, residuals remained on lingual surface, tongue base and vallecular sinus. PO residual increase correlated with viscosity increase. Cued and uncued subsequent swallows did fully clear oral and pharyngeal residuals. Swallow was initiated at level of the vallecular sinus with solids and honey thick liquids but initiated at level of pyriform sinus with nectar thick and thin liquids. Flash penetration above vocal cords (PAS 2) occured with both thin and nectar thick liquids. 13mm barium tablet transited through PES and esophagus without difficulty. Esophageal sweep revealed retrograde flow of barium in distal esophagus and mild amount of barium stasis. SLP suspects that patient's dysphagia is chronic in nature and primary aspiration risk is from her inefficient mastication of solids, impulsive, rapid PO intake, and h/o GERD. Factors that may increase risk of adverse event in presence of aspiration Roderick Civatte & Jessy Morocco 2021): Factors that may increase risk of adverse event in presence of aspiration Roderick Civatte & Jessy Morocco 2021): Limited mobility; Dependence for feeding and/or oral hygiene; Reduced cognitive function Recommendations/Plan: Swallowing Evaluation Recommendations Swallowing Evaluation Recommendations Recommendations: PO diet PO Diet Recommendation: Dysphagia 3 (Mechanical soft); Thin liquids (Level 0) Liquid Administration via: Cup; Straw Medication Administration: Whole meds with liquid Supervision: Full supervision/cueing for swallowing strategies; Full assist for feeding Swallowing strategies  : Small bites/sips; Slow rate; Follow solids with liquids Postural changes: Position pt fully upright for meals  Oral care recommendations: Oral care BID (2x/day); Staff/trained caregiver to provide oral care Treatment Plan Treatment Plan Treatment recommendations: Therapy as outlined in treatment plan below Follow-up recommendations: Skilled nursing-short term rehab (<3 hours/day) Functional status assessment: Patient has had a recent decline in their functional status and demonstrates the ability to make significant improvements in function in a reasonable and predictable amount of time. Treatment frequency: Min 1x/week Treatment duration: 1 week Interventions: Aspiration precaution training; Diet toleration management by SLP; Patient/family education Recommendations Recommendations for follow up therapy are one component of a multi-disciplinary discharge planning process, led by the attending physician.  Recommendations may be updated based on patient status, additional functional criteria and insurance authorization. Assessment: Orofacial Exam: Orofacial Exam Oral Cavity: Oral Hygiene: WFL Oral Cavity - Dentition: Adequate natural dentition; Poor condition Orofacial Anatomy: WFL Oral Motor/Sensory Function: Generalized oral weakness Anatomy: Anatomy: WFL Boluses Administered: Boluses Administered Boluses Administered: Thin liquids (Level 0); Mildly thick liquids (Level 2, nectar thick); Moderately thick liquids (Level 3, honey thick); Puree; Solid  Oral Impairment Domain: Oral Impairment Domain Lip Closure: No labial escape Tongue control during bolus hold: Not tested Bolus preparation/mastication: Slow prolonged chewing/mashing with complete recollection Bolus transport/lingual motion: Slow tongue  motion Oral residue: Residue collection on oral structures Location of oral residue : Tongue; Palate  Pharyngeal Impairment Domain: Pharyngeal Impairment Domain Soft palate elevation: No bolus between soft palate (SP)/pharyngeal wall (PW) Laryngeal elevation: Partial superior movement of thyroid cartilage/partial approximation  of arytenoids to epiglottic petiole Anterior hyoid excursion: Partial anterior movement Epiglottic movement: Partial inversion Laryngeal vestibule closure: Incomplete, narrow column air/contrast in laryngeal vestibule Pharyngeal stripping wave : Present - complete Pharyngeal contraction (A/P view only): N/A Pharyngoesophageal segment opening: Complete distension and complete duration, no obstruction of flow Tongue base retraction: Trace column of contrast or air between tongue base and PPW Pharyngeal residue: Trace residue within or on pharyngeal structures Location of pharyngeal residue: Tongue base; Valleculae  Esophageal Impairment Domain: Esophageal Impairment Domain Esophageal clearance upright position: Complete clearance, esophageal coating Pill: Pill Consistency administered: Thin liquids (Level 0) Thin liquids (Level 0): [facility] Penetration/Aspiration Scale Score: Penetration/Aspiration Scale Score 1.  Material does not enter airway: Mildly thick liquids (Level 2, nectar thick); Moderately thick liquids (Level 3, honey thick); Puree; Solid 2.  Material enters airway, remains ABOVE vocal cords then ejected out: Thin liquids (Level 0) Compensatory Strategies: Compensatory Strategies Compensatory strategies: Yes Straw: Effective Effective Straw: Thin liquid (Level 0)   General Information: No data recorded Diet Prior to this Study: Dysphagia 2 (finely chopped); Mildly thick liquids (Level 2, nectar thick)   No data recorded  Respiratory Status: WFL   Supplemental O2: None (Room air)   History of Recent Intubation: No  Behavior/Cognition: Alert; Cooperative; Requires cueing Self-Feeding Abilities: Dependent for feeding Baseline vocal quality/speech: Normal Volitional Cough: Unable to elicit Volitional Swallow: Able to elicit Exam Limitations: No limitations Goal Planning: Prognosis for improved oropharyngeal function: Fair Barriers to Reach Goals: Cognitive deficits; Behavior; Motivation No data recorded  Patient/Family Stated Goal: nothing specific, no family/caregivers present Consulted and agree with results and recommendations: Pt unable/family or caregiver not available Pain: Pain Assessment Pain Assessment: Faces Pain Score: 6 Faces Pain Scale: 4 Pain Location: back when repositioning Pain Descriptors / Indicators: Aching; Grimacing Pain Intervention(s): Monitored during session End of Session: Start Time:SLP Start Time (ACUTE ONLY): 0830 Stop Time: SLP Stop Time (ACUTE ONLY): 0840 Time Calculation:SLP Time Calculation (min) (ACUTE ONLY): 10 min Charges: SLP Evaluations $ SLP Speech Visit: 1 Visit SLP Evaluations $BSS Swallow: 1 Procedure $MBS Swallow: 1 Procedure SLP visit diagnosis: SLP Visit Diagnosis: Dysphagia, oropharyngeal phase (R13.12) Past Medical History: Past Medical History: Diagnosis Date  Depression   Diabetes mellitus   type 2  GERD (gastroesophageal reflux disease)   Hypertension   Hypoxemia   Mental retardation   Neuroleptic malignant syndrome   Rhabdomyolysis   Schizophrenia (HCC)   Thyroid disease   Vitamin D deficient osteomalacia  Past Surgical History: Past Surgical History: Procedure Laterality Date  COLONOSCOPY  2010  MANN Jacqualine Mater, MA, CCC-SLP Speech Therapy   EEG adult Result Date: 06/22/2023 Arleene Lack, MD     06/22/2023 11:38 AM Patient Name: GABRIELLE GREENHILL MRN: [id number] Epilepsy Attending: Arleene Lack Referring Physician/Provider: Sundil, Subrina, MD Date: 06/22/2023 Duration: 22.48 mins Patient history: 68 year old female with bilateral arm shakiness and bilateral knee joint shakiness.  EEG to alert for seizure. Level of alertness: Awake AEDs during EEG study: Depakote  Technical aspects: This EEG study was done with scalp electrodes positioned according to the 10-20 International system of electrode placement. Electrical activity was reviewed with band pass filter of 1-70Hz , sensitivity of 7 uV/mm, display speed of 57mm/sec with a 60Hz  notched  filter applied as  appropriate. EEG data were recorded continuously and digitally stored.  Video monitoring was available and reviewed as appropriate. Description: The posterior dominant rhythm consists of 8 Hz activity of moderate voltage (25-35 uV) seen predominantly in posterior head regions, symmetric and reactive to eye opening and eye closing. Hyperventilation and photic stimulation were not performed.   IMPRESSION: This study is within normal limits. No seizures or epileptiform discharges were seen throughout the recording. A normal interictal EEG does not exclude the diagnosis of epilepsy. Arleene Lack   DG Chest Port 1 View Result Date: 06/20/2023 CLINICAL DATA:  Altered mental status. EXAM: PORTABLE CHEST 1 VIEW COMPARISON:  02/14/2023 FINDINGS: Shallow inspiration. Heart size and pulmonary vascularity are normal. Lungs are clear. No pleural effusions. No pneumothorax. Mediastinal contours appear intact. Degenerative changes in the spine and shoulders. Surgical clips in the right upper quadrant. IMPRESSION: Shallow inspiration.  No evidence of active pulmonary disease. Electronically Signed   By: Boyce Byes M.D.   On: 06/20/2023 20:28   CT Head Wo Contrast Result Date: 06/20/2023 CLINICAL DATA:  Mental status change EXAM: CT HEAD WITHOUT CONTRAST TECHNIQUE: Contiguous axial images were obtained from the base of the skull through the vertex without intravenous contrast. RADIATION DOSE REDUCTION: This exam was performed according to the departmental dose-optimization program which includes automated exposure control, adjustment of the mA and/or kV according to patient size and/or use of iterative reconstruction technique. COMPARISON:  CT 06/12/2023, MRI 06/12/2023, 10/01/2022, 04/21/2023 FINDINGS: Brain: No acute territorial infarction, hemorrhage, or intracranial mass. Mild atrophy. Stable ventriculomegaly. Mild chronic small vessel ischemic changes of the white matter Vascular: No hyperdense vessel or  unexpected calcification. Skull: Normal. Negative for fracture or focal lesion. Sinuses/Orbits: No acute finding. Other: None IMPRESSION: 1. No CT evidence for acute intracranial abnormality. 2. Mild atrophy and chronic small vessel ischemic changes of the white matter. Stable ventriculomegaly. Electronically Signed   By: Esmeralda Hedge M.D.   On: 06/20/2023 19:52   MR BRAIN WO CONTRAST Result Date: 06/12/2023 CLINICAL DATA:  68 year old female neurologic deficit. Code stroke. Slurred speech and left side weakness. EXAM: MRI HEAD WITHOUT CONTRAST TECHNIQUE: Multiplanar, multiecho pulse sequences of the brain and surrounding structures were obtained without intravenous contrast. COMPARISON:  Plain head CT today 1151 hours.  Brain MRI 02/14/2023. FINDINGS: Brain: No restricted diffusion or evidence of acute infarction. Chronic and stable nonspecific ventriculomegaly. Generalized as before and some evidence of hyperdynamic flow at the cerebral aqueduct. Stable cerebral volume. No midline shift, intracranial mass effect, extra-axial collection or acute intracranial hemorrhage. Cervicomedullary junction and pituitary are within normal limits. Scattered cerebral white matter T2 and FLAIR hyperintensity is stable, mostly subcortical, and mild or at most moderate for age. No cortical encephalomalacia or chronic cerebral blood products identified. Deep gray matter nuclei, brainstem, cerebellum signal remains normal. Compared to a 2020 [facility] MRI some brainstem volume loss is suspected (series 9, image 13 today). Vascular: Major intracranial vascular flow voids are stable from last year. Skull and upper cervical spine: Stable, negative. Sinuses/Orbits: Stable, negative. Other: Visible internal auditory structures appear normal. Mastoids remain well aerated. Negative visible scalp and face. IMPRESSION: 1. No evidence of acute infarct or acute intracranial abnormality. 2. Stable noncontrast MRI of the brain notable  for: - chronic and nonspecific ventriculomegaly, - mild for age cerebral white matter signal changes, - possibly disproportionate brainstem volume loss since 2020. Electronically Signed   By: Marlise Simpers M.D.   On: 06/12/2023 12:34  CT HEAD CODE STROKE WO CONTRAST Result Date: 06/12/2023 CLINICAL DATA:  Code stroke. Neuro deficit, acute, stroke suspected. EXAM: CT HEAD WITHOUT CONTRAST TECHNIQUE: Contiguous axial images were obtained from the base of the skull through the vertex without intravenous contrast. RADIATION DOSE REDUCTION: This exam was performed according to the departmental dose-optimization program which includes automated exposure control, adjustment of the mA and/or kV according to patient size and/or use of iterative reconstruction technique. COMPARISON:  CT head without contrast 04/21/2023. FINDINGS: Brain: Moderate atrophy and mild white matter changes bilaterally are stable. No acute infarct, hemorrhage, or mass lesion is present. The ventricles are proportionate to the degree of atrophy. No significant extraaxial fluid collection is present. The brainstem and cerebellum are within normal limits. Midline structures are within normal limits. Vascular: No hyperdense vessel or unexpected calcification. Skull: Calvarium is intact. No focal lytic or blastic lesions are present. No significant extracranial soft tissue lesion is present. Sinuses/Orbits: The paranasal sinuses and mastoid air cells are clear. Bilateral lens replacements are noted. Globes and orbits are otherwise unremarkable. ASPECTS [facility] Stroke Program Early CT Score) - Ganglionic level infarction (caudate, lentiform nuclei, internal capsule, insula, M1-M3 cortex): 7/7 - Supraganglionic infarction (M4-M6 cortex): 3/3 Total score (0-10 with 10 being normal): 10/10 IMPRESSION: 1. No acute intracranial abnormality or significant interval change. 2. Stable atrophy and white matter disease. This likely reflects the sequela of chronic  microvascular ischemia. 3. Aspects 10/10 The above was relayed via text pager to Dr. Nicholes Barks on 06/12/2023 at 12:06 pm . Electronically Signed   By: Audree Leas M.D.   On: 06/12/2023 12:07    Microbiology: Results for orders placed or performed during the hospital encounter of 06/20/23  Resp panel by RT-PCR (RSV, Flu A&B, Covid) Anterior Nasal Swab     Status: Abnormal   Collection Time: 06/20/23  7:59 PM   Specimen: Anterior Nasal Swab  Result Value Ref Range Status   SARS Coronavirus 2 by RT PCR NEGATIVE NEGATIVE Final   Influenza A by PCR POSITIVE (A) NEGATIVE Final   Influenza B by PCR NEGATIVE NEGATIVE Final    Comment: (NOTE) The Xpert Xpress SARS-CoV-2/FLU/RSV plus assay is intended as an aid in the diagnosis of influenza from Nasopharyngeal swab specimens and should not be used as a sole basis for treatment. Nasal washings and aspirates are unacceptable for Xpert Xpress SARS-CoV-2/FLU/RSV testing.  Fact Sheet for Patients: BloggerCourse.com  Fact Sheet for Healthcare Providers: SeriousBroker.it  This test is not yet approved or cleared by the [place]  FDA and has been authorized for detection and/or diagnosis of SARS-CoV-2 by FDA under an Emergency Use Authorization (EUA). This EUA will remain in effect (meaning this test can be used) for the duration of the COVID-19 declaration under Section 564(b)(1) of the Act, 21 U.S.C. section 360bbb-3(b)(1), unless the authorization is terminated or revoked.     Resp Syncytial Virus by PCR NEGATIVE NEGATIVE Final    Comment: (NOTE) Fact Sheet for Patients: BloggerCourse.com  Fact Sheet for Healthcare Providers: SeriousBroker.it  This test is not yet approved or cleared by the [place]  FDA and has been authorized for detection and/or diagnosis of SARS-CoV-2 by FDA under an Emergency Use Authorization (EUA).  This EUA will remain in effect (meaning this test can be used) for the duration of the COVID-19 declaration under Section 564(b)(1) of the Act, 21 U.S.C. section 360bbb-3(b)(1), unless the authorization is terminated or revoked.  Performed at [facility], [address] [place], [place]  [address]   Respiratory (~20 pathogens) panel by PCR     Status: Abnormal   Collection Time: 06/21/23  2:37 PM   Specimen: Nasopharyngeal Swab; Respiratory  Result Value Ref Range Status   Adenovirus NOT DETECTED NOT DETECTED Final   Coronavirus 229E NOT DETECTED NOT DETECTED Final    Comment: (NOTE) The Coronavirus on the Respiratory Panel, DOES NOT test for the novel  Coronavirus (2019 nCoV)    Coronavirus HKU1 NOT DETECTED NOT DETECTED Final   Coronavirus NL63 NOT DETECTED NOT DETECTED Final   Coronavirus OC43 NOT DETECTED NOT DETECTED Final   Metapneumovirus NOT DETECTED NOT DETECTED Final   Rhinovirus / Enterovirus NOT DETECTED NOT DETECTED Final   Influenza A H3 DETECTED (A) NOT DETECTED Final   Influenza B NOT DETECTED NOT DETECTED Final   Parainfluenza Virus 1 NOT DETECTED NOT DETECTED Final   Parainfluenza Virus 2 NOT DETECTED NOT DETECTED Final   Parainfluenza Virus 3 NOT DETECTED NOT DETECTED Final   Parainfluenza Virus 4 NOT DETECTED NOT DETECTED Final   Respiratory Syncytial Virus NOT DETECTED NOT DETECTED Final   Bordetella pertussis NOT DETECTED NOT DETECTED Final   Bordetella Parapertussis NOT DETECTED NOT DETECTED Final   Chlamydophila pneumoniae NOT DETECTED NOT DETECTED Final   Mycoplasma pneumoniae NOT DETECTED NOT DETECTED Final    Comment: Performed at [facility], [address], [place], [place] [address]    Labs: CBC: Recent Labs  Lab 06/23/23 [phone] 06/24/23 0415 06/28/23 0430  WBC 3.3* 3.9* 5.5  NEUTROABS  --  1.8  --   HGB 11.3* 12.7 11.6*  HCT 35.0* 39.4 35.9*  MCV 87.5 87.6 88.2  PLT 144* 160 212   Basic Metabolic Panel: Recent Labs  Lab  06/22/23 0518 06/23/23 0438 06/24/23 0415 06/28/23 0430  NA 140 137 140 141  K 3.3* 3.4* 3.4* 3.9  CL 99 100 100 96*  CO2 28 26 27 28   GLUCOSE 108* 122* 123* 118*  BUN 11 11 13 13   CREATININE 0.75 0.81 0.92 0.95  CALCIUM  8.4* 8.4* 8.5* 9.7  MG 1.5* 1.8 1.8 1.9   Liver Function Tests: No results for input(s): "AST", "ALT", "ALKPHOS", "BILITOT", "PROT", "ALBUMIN" in the last 168 hours. CBG: Recent Labs  Lab 06/27/23 0809 06/27/23 1221 06/27/23 1610 06/27/23 2057 06/28/23 0756  GLUCAP 126* 192* 97 206* 141*    Discharge time spent: less than 30 minutes.  Signed: Jodeane Mulligan, DO [facility] 06/28/2023

## 2023-06-28 NOTE — Progress Notes (Signed)
 Physical Therapy Treatment Patient Details Name: Annette Martin MRN: [id number] DOB: September 01, 1955 Today's Date: 06/28/2023   History of Present Illness The pt is a 68 yo female presenting from ALF on 2/2 with AMS. Work up revealed: acute metabolic encephalopathy, influenza A, and dehydration. PMH includes: schizophrenia, DM II, HLD, HTN, hypothyroidism, and urinary incontinence.    PT Comments  Patient received in bed, asking to take out pure wick. Which she had already removed. Patient is alert. Not oriented. She is able to roll to her right side with mod A, needed max to total assist to rise up from side to sitting on side of bed. She required min A to maintain sitting balance at edge of bed. Limited by lethargy requiring cues to open eyes at times. Patient is too weak, unsafe to attempt standing at this time. She will continue to benefit from skilled PT to improve strength and functional independence.       If plan is discharge home, recommend the following: Two people to help with walking and/or transfers;Two people to help with bathing/dressing/bathroom;Assistance with cooking/housework;Direct supervision/assist for medications management;Direct supervision/assist for financial management;Assist for transportation;Help with stairs or ramp for entrance;Supervision due to cognitive status   Can travel by private vehicle     No  Equipment Recommendations  None recommended by PT    Recommendations for Other Services       Precautions / Restrictions Precautions Precautions: Fall Restrictions Weight Bearing Restrictions Per Provider Order: No     Mobility  Bed Mobility Overal bed mobility: Needs Assistance Bed Mobility: Rolling, Sidelying to Sit, Sit to Supine Rolling: Max assist Sidelying to sit: Max assist   Sit to supine: Max assist   General bed mobility comments: attemtped to facilitate rolling but pt with little attempt to assist despite max cues and increased processing  time. once sitting EOB pt needing min A to maintain static sitting    Transfers                   General transfer comment: not attempted due to lethargy and poor sitting balance/strength    Ambulation/Gait                   Stairs             Wheelchair Mobility     Tilt Bed    Modified Rankin (Stroke Patients Only)       Balance Overall balance assessment: Needs assistance Sitting-balance support: Feet supported Sitting balance-Leahy Scale: Poor Sitting balance - Comments: needs min A for sitting balance. Posterior lean. Postural control: Posterior lean                                  Cognition Arousal: Lethargic Behavior During Therapy: Flat affect Overall Cognitive Status: No family/caregiver present to determine baseline cognitive functioning                                 General Comments: patient speaking clearly this date for the most part. She is lethargic        Exercises      General Comments        Pertinent Vitals/Pain Pain Assessment Pain Assessment: PAINAD Faces Pain Scale: No hurt Breathing: normal Negative Vocalization: none Facial Expression: smiling or inexpressive Body Language: relaxed Consolability: no need to console PAINAD Score:  0 Pain Intervention(s): Monitored during session, Repositioned    Home Living                          Prior Function            PT Goals (current goals can now be found in the care plan section) Acute Rehab PT Goals Patient Stated Goal: none stated PT Goal Formulation: With patient Time For Goal Achievement: 07/05/23 Potential to Achieve Goals: Fair Progress towards PT goals: Progressing toward goals    Frequency    Min 1X/week      PT Plan      Co-evaluation              AM-PAC PT "6 Clicks" Mobility   Outcome Measure  Help needed turning from your back to your side while in a flat bed without using bedrails?:  A Lot Help needed moving from lying on your back to sitting on the side of a flat bed without using bedrails?: Total Help needed moving to and from a bed to a chair (including a wheelchair)?: Total Help needed standing up from a chair using your arms (e.g., wheelchair or bedside chair)?: Total Help needed to walk in hospital room?: Total Help needed climbing 3-5 steps with a railing? : Total 6 Click Score: 7    End of Session   Activity Tolerance: Patient limited by fatigue;Patient limited by lethargy Patient left: in bed;with call bell/phone within reach;with bed alarm set Nurse Communication: Mobility status;Need for lift equipment PT Visit Diagnosis: Unsteadiness on feet (R26.81);Muscle weakness (generalized) (M62.81);Other abnormalities of gait and mobility (R26.89)     Time: 3382-5053 PT Time Calculation (min) (ACUTE ONLY): 11 min  Charges:    $Therapeutic Activity: 8-22 mins PT General Charges $$ ACUTE PT VISIT: 1 Visit                     Jaeshawn Silvio, PT, GCS 06/28/23,9:37 AM

## 2023-07-17 DEATH — deceased

## 2023-07-22 NOTE — Telephone Encounter (Signed)
 [facility] is cancelling the dat scan order because they have made multiple attempts to schedule and the patient has never called them back.

## 2023-08-05 ENCOUNTER — Telehealth: Payer: Self-pay | Admitting: Diagnostic Neuroimaging

## 2023-08-05 NOTE — Telephone Encounter (Signed)
 Pt passed away 07/03/2023.

## 2023-08-09 ENCOUNTER — Institutional Professional Consult (permissible substitution): Payer: Medicare PPO | Admitting: Diagnostic Neuroimaging
